# Patient Record
Sex: Male | Born: 1935 | Race: White | Hispanic: No | Marital: Married | Smoking: Never smoker
Health system: Southern US, Community
[De-identification: ages and names within clinical notes are randomized; demographics above are authoritative.]

## PROBLEM LIST (undated history)

## (undated) DIAGNOSIS — C61 Malignant neoplasm of prostate: Secondary | ICD-10-CM

## (undated) DIAGNOSIS — R972 Elevated prostate specific antigen [PSA]: Secondary | ICD-10-CM

## (undated) DIAGNOSIS — Z889 Allergy status to unspecified drugs, medicaments and biological substances status: Secondary | ICD-10-CM

## (undated) DIAGNOSIS — I10 Essential (primary) hypertension: Secondary | ICD-10-CM

## (undated) DIAGNOSIS — F329 Major depressive disorder, single episode, unspecified: Secondary | ICD-10-CM

## (undated) DIAGNOSIS — L570 Actinic keratosis: Secondary | ICD-10-CM

## (undated) DIAGNOSIS — G4700 Insomnia, unspecified: Secondary | ICD-10-CM

## (undated) DIAGNOSIS — M542 Cervicalgia: Secondary | ICD-10-CM

## (undated) DIAGNOSIS — H269 Unspecified cataract: Secondary | ICD-10-CM

## (undated) DIAGNOSIS — M436 Torticollis: Secondary | ICD-10-CM

## (undated) DIAGNOSIS — D649 Anemia, unspecified: Secondary | ICD-10-CM

## (undated) DIAGNOSIS — I739 Peripheral vascular disease, unspecified: Secondary | ICD-10-CM

## (undated) DIAGNOSIS — F32A Depression, unspecified: Secondary | ICD-10-CM

## (undated) DIAGNOSIS — Z8619 Personal history of other infectious and parasitic diseases: Secondary | ICD-10-CM

## (undated) HISTORY — PX: GALLBLADDER SURGERY: SHX652

## (undated) HISTORY — DX: Cervicalgia: M54.2

## (undated) HISTORY — DX: Major depressive disorder, single episode, unspecified: F32.9

## (undated) HISTORY — DX: Actinic keratosis: L57.0

## (undated) HISTORY — DX: Insomnia, unspecified: G47.00

## (undated) HISTORY — DX: Depression, unspecified: F32.A

## (undated) HISTORY — DX: Unspecified cataract: H26.9

## (undated) HISTORY — DX: Elevated prostate specific antigen (PSA): R97.20

## (undated) HISTORY — DX: Torticollis: M43.6

## (undated) HISTORY — DX: Peripheral vascular disease, unspecified: I73.9

## (undated) HISTORY — DX: Malignant neoplasm of prostate: C61

## (undated) HISTORY — DX: Anemia, unspecified: D64.9

## (undated) HISTORY — DX: Personal history of other infectious and parasitic diseases: Z86.19

## (undated) HISTORY — DX: Allergy status to unspecified drugs, medicaments and biological substances: Z88.9

---

## 1993-11-17 HISTORY — PX: BACK SURGERY: SHX140

## 2011-11-18 DIAGNOSIS — C61 Malignant neoplasm of prostate: Secondary | ICD-10-CM

## 2011-11-18 HISTORY — DX: Malignant neoplasm of prostate: C61

## 2011-11-18 HISTORY — PX: PROSTATE SURGERY: SHX751

## 2011-11-19 DIAGNOSIS — Z87898 Personal history of other specified conditions: Secondary | ICD-10-CM | POA: Insufficient documentation

## 2011-11-19 DIAGNOSIS — N401 Enlarged prostate with lower urinary tract symptoms: Secondary | ICD-10-CM | POA: Insufficient documentation

## 2011-11-19 DIAGNOSIS — R972 Elevated prostate specific antigen [PSA]: Secondary | ICD-10-CM | POA: Insufficient documentation

## 2012-08-04 ENCOUNTER — Encounter: Payer: Self-pay | Admitting: *Deleted

## 2012-08-09 ENCOUNTER — Ambulatory Visit (INDEPENDENT_AMBULATORY_CARE_PROVIDER_SITE_OTHER): Payer: Medicare Other | Admitting: Cardiovascular Disease

## 2012-08-09 ENCOUNTER — Encounter: Payer: Self-pay | Admitting: Cardiovascular Disease

## 2012-08-09 VITALS — BP 124/72 | HR 92 | Ht 69.0 in | Wt 178.8 lb

## 2012-08-09 DIAGNOSIS — R9431 Abnormal electrocardiogram [ECG] [EKG]: Secondary | ICD-10-CM

## 2012-08-09 DIAGNOSIS — C61 Malignant neoplasm of prostate: Secondary | ICD-10-CM | POA: Insufficient documentation

## 2012-08-09 NOTE — Assessment & Plan Note (Signed)
Right bundle-branch block seen on the past EKGs. He is asymptomatic, exercising on a regular basis. Clinically, no signs of heart failure. No symptoms of angina. No further testing needed at this time. He would be acceptable risk for any prostate surgery.

## 2012-08-09 NOTE — Progress Notes (Signed)
Patient ID: Stuart Smith, male    DOB: 07-28-1927, 78 y.o.   MRN: [id number]  HPI Comments: Stuart Smith is a very pleasant 78 year old gentleman with a long history of prostate issues, s/p TURP, found to have prostate cancer, who is scheduled to meet with urology tomorrow. If his workup, he was concerned about an abnormal EKG and presents for evaluation.  He currently [facility] at [facility] it is very. He exercises several times per week and uses the treadmill. He reports he typically gets his heart rate up to 115 beats per minute, is able to go 3 miles per hour without any difficulty.  PSA recently increased from 9-13. He had biopsies showing no cancer. He underwent TURP and pathology showed prostate cancer.  EKG done during his procedure showed right bundle-branch block. Blood pressure had decreased following anesthesia.  EKGs available for our review today are from our office showing normal sinus rhythm, right bundle branch block with left axis deviation, heart rate 91 beats per minute  EKG done from Dr. Delia Chimes office shows normal sinus rhythm with right bundle branch block, left axis is not as apparent  EKG done at [facility] 07/06/2012 shows normal sinus rhythm with right bundle branch block, rate 75 beats per minute   Outpatient Encounter Prescriptions as of 08/09/2012  Medication Sig Dispense Refill  . amitriptyline (ELAVIL) 50 MG tablet Take 50 mg by mouth at bedtime.      Marland Kitchen aspirin 81 MG tablet Take 81 mg by mouth daily.      . Cetirizine HCl (ZYRTEC ALLERGY) 10 MG CAPS Take 10 mg by mouth daily as needed.      . clonazePAM (KLONOPIN) 0.5 MG tablet Take 0.5 mg by mouth 3 (three) times daily.      Marland Kitchen lisinopril (PRINIVIL,ZESTRIL) 10 MG tablet Take 10 mg by mouth daily.      . Tamsulosin HCl (FLOMAX) 0.4 MG CAPS Take 0.4 mg by mouth daily.         Review of Systems  Constitutional: Negative.   HENT: Negative.   Eyes: Negative.   Respiratory: Negative.   Cardiovascular:  Negative.   Gastrointestinal: Negative.   Musculoskeletal: Negative.   Skin: Negative.   Neurological: Negative.   Hematological: Negative.   Psychiatric/Behavioral: Negative.   All other systems reviewed and are negative.    BP 124/72  Pulse 92  Ht 5\' 9"  (1.753 m)  Wt 178 lb 12 oz (81.08 kg)  BMI 26.40 kg/m2  Physical Exam  Nursing note and vitals reviewed. Constitutional: He is oriented to person, place, and time. He appears well-developed and well-nourished.  HENT:  Head: Normocephalic.  Nose: Nose normal.  Mouth/Throat: Oropharynx is clear and moist.  Eyes: Conjunctivae normal are normal. Pupils are equal, round, and reactive to light.  Neck: Normal range of motion. Neck supple. No JVD present.  Cardiovascular: Normal rate, regular rhythm, S1 normal, S2 normal, normal heart sounds and intact distal pulses.  Exam reveals no gallop and no friction rub.   No murmur heard. Pulmonary/Chest: Effort normal and breath sounds normal. No respiratory distress. He has no wheezes. He has no rales. He exhibits no tenderness.  Abdominal: Soft. Bowel sounds are normal. He exhibits no distension. There is no tenderness.  Musculoskeletal: Normal range of motion. He exhibits no edema and no tenderness.  Lymphadenopathy:    He has no cervical adenopathy.  Neurological: He is alert and oriented to person, place, and time. Coordination normal.  Skin: Skin is  warm and dry. No rash noted. No erythema.  Psychiatric: He has a normal mood and affect. His behavior is normal. Judgment and thought content normal.           Assessment and Plan

## 2012-08-09 NOTE — Patient Instructions (Addendum)
You are doing well. No medication changes were made.  Please call us if you have new issues that need to be addressed before your next appt.  Your physician wants you to follow-up in: 12 months.  You will receive a reminder letter in the mail two months in advance. If you don't receive a letter, please call our office to schedule the follow-up appointment. 

## 2012-08-09 NOTE — Assessment & Plan Note (Signed)
He has a meeting with urology tomorrow to discuss further treatment options. If surgery is needed, he would be an acceptable risk.

## 2012-08-10 DIAGNOSIS — C61 Malignant neoplasm of prostate: Secondary | ICD-10-CM | POA: Insufficient documentation

## 2012-08-11 ENCOUNTER — Encounter: Payer: Self-pay | Admitting: Cardiovascular Disease

## 2013-05-19 ENCOUNTER — Ambulatory Visit (INDEPENDENT_AMBULATORY_CARE_PROVIDER_SITE_OTHER): Payer: Medicare Other | Admitting: Cardiovascular Disease

## 2013-05-19 ENCOUNTER — Encounter: Payer: Self-pay | Admitting: Cardiovascular Disease

## 2013-05-19 VITALS — BP 132/72 | HR 74 | Ht 69.0 in | Wt 186.0 lb

## 2013-05-19 DIAGNOSIS — I700 Atherosclerosis of aorta: Secondary | ICD-10-CM | POA: Insufficient documentation

## 2013-05-19 DIAGNOSIS — R9431 Abnormal electrocardiogram [ECG] [EKG]: Secondary | ICD-10-CM

## 2013-05-19 NOTE — Assessment & Plan Note (Addendum)
Outside CT scan Report indicating aortic atherosclerosis. Full report not available . We'll try to have him obtain the images for our review. Uncertain if this is trivial, mild to moderate or severe. Cholesterol is low on no medications. He is not a smoker, no diabetes. No significant family history.  Discussed this with him and he's not eager to start a medication. He does report previous screening of his carotid and aVR is in the past. He reports this was normal.

## 2013-05-19 NOTE — Assessment & Plan Note (Signed)
Baseline left bundle branch block. No further workup at this time

## 2013-05-19 NOTE — Progress Notes (Signed)
Patient ID: Stuart Smith, male    DOB: [birth year], 79 y.o.   MRN: [id number]  HPI Comments: Stuart Smith is a very pleasant 79 year old gentleman with a long history of prostate issues, s/p TURP, found to have prostate cancer, status post prostate resection, who presents for routine followup.  He is concerned after a bone scan showed abdominal aortic atherosclerosis. This was also previously seen on x-ray earlier.   He currently lives [facility] [place] it is very. He exercises several times per week and uses the treadmill.  Denies any significant chest pain with exertion. He does report that his PSA has been slowly climbing and he was recently started on hormone shots.  EKG shows normal sinus rhythm with rate 74 beats a minute, right bundle branch block  Bone scan done 05/05/2013, total abdominal aortic atherosclerosis but does not indicate the extent  Recent lab work shows total cholesterol 147, LDL 88, HDL 46   Outpatient Encounter Prescriptions as of 05/19/2013  Medication Sig Dispense Refill  . amitriptyline (ELAVIL) 50 MG tablet Take 50 mg by mouth at bedtime.      Marland Kitchen aspirin 81 MG tablet Take 81 mg by mouth daily.      . clonazePAM (KLONOPIN) 0.5 MG tablet Take 0.5 mg by mouth 3 (three) times daily.      Marland Kitchen lisinopril (PRINIVIL,ZESTRIL) 10 MG tablet Take 10 mg by mouth daily.      . NON FORMULARY horomone injection LH RH      . [DISCONTINUED] Cetirizine HCl (ZYRTEC ALLERGY) 10 MG CAPS Take 10 mg by mouth daily as needed.      . [DISCONTINUED] Tamsulosin HCl (FLOMAX) 0.4 MG CAPS Take 0.4 mg by mouth daily.       No facility-administered encounter medications on file as of 05/19/2013.     Review of Systems  Constitutional: Negative.   HENT: Negative.   Eyes: Negative.   Respiratory: Negative.   Cardiovascular: Negative.   Gastrointestinal: Negative.   Musculoskeletal: Negative.   Skin: Negative.   Neurological: Negative.   Psychiatric/Behavioral: Negative.   All other systems  reviewed and are negative.    BP 132/72  Pulse 74  Ht 5\' 9"  (1.753 m)  Wt 186 lb (84.369 kg)  BMI 27.45 kg/m2  Physical Exam  Nursing note and vitals reviewed. Constitutional: He is oriented to person, place, and time. He appears well-developed and well-nourished.  HENT:  Head: Normocephalic.  Nose: Nose normal.  Mouth/Throat: Oropharynx is clear and moist.  Eyes: Conjunctivae are normal. Pupils are equal, round, and reactive to light.  Neck: Normal range of motion. Neck supple. No JVD present.  Cardiovascular: Normal rate, regular rhythm, S1 normal, S2 normal, normal heart sounds and intact distal pulses.  Exam reveals no gallop and no friction rub.   No murmur heard. Pulmonary/Chest: Effort normal and breath sounds normal. No respiratory distress. He has no wheezes. He has no rales. He exhibits no tenderness.  Abdominal: Soft. Bowel sounds are normal. He exhibits no distension. There is no tenderness.  Musculoskeletal: Normal range of motion. He exhibits no edema and no tenderness.  Lymphadenopathy:    He has no cervical adenopathy.  Neurological: He is alert and oriented to person, place, and time. Coordination normal.  Skin: Skin is warm and dry. No rash noted. No erythema.  Psychiatric: He has a normal mood and affect. His behavior is normal. Judgment and thought content normal.      Assessment and Plan

## 2013-05-19 NOTE — Patient Instructions (Addendum)
You are doing well. No medication changes were made.  Please call us if you have new issues that need to be addressed before your next appt.  Your physician wants you to follow-up in: 12 months.  You will receive a reminder letter in the mail two months in advance. If you don't receive a letter, please call our office to schedule the follow-up appointment. 

## 2013-05-30 ENCOUNTER — Encounter: Payer: Self-pay | Admitting: *Deleted

## 2013-06-03 ENCOUNTER — Telehealth: Payer: Self-pay | Admitting: *Deleted

## 2013-06-03 NOTE — Telephone Encounter (Signed)
Dr. Mariah Milling had asked patient to get a copy of the lumbar and whole body xray from [facility].  The patient dropped off this cd for dr. Mariah Milling to review and it was placed in the nursing blue tray.  Please call patient once it has been reviewed.

## 2013-06-06 NOTE — Telephone Encounter (Signed)
Unfortunately the images that the patient provided do not let me look at the heart  The x-ray which does not allow [place] to see coronary artery disease The other set of images is a nuclear scan looking at the bones  I was under the impression that it was a CT scan that he had had  Started to make him have jumped through loops to get the images Unfortunately they do not let us look at the heart I think he would be worth him holding onto the CD And I will leave it at the front desk for him for pickup

## 2013-06-06 NOTE — Telephone Encounter (Signed)
Please review CD of xrays from [facility] and advised.  Thanks

## 2013-06-06 NOTE — Telephone Encounter (Signed)
Called spoke with pt advised per Dr Mariah Milling unable to visualize the heart in the films that he dropped off at the office. Advised pt Dr Mariah Milling recommends he hold onto these films just in case he may need in the future.  Will leave at front desk for pt to pick up.  Pt aware

## 2013-06-22 ENCOUNTER — Other Ambulatory Visit: Payer: Self-pay

## 2013-09-09 DIAGNOSIS — G243 Spasmodic torticollis: Secondary | ICD-10-CM | POA: Insufficient documentation

## 2013-09-09 DIAGNOSIS — G245 Blepharospasm: Secondary | ICD-10-CM | POA: Insufficient documentation

## 2013-09-22 ENCOUNTER — Other Ambulatory Visit: Payer: Self-pay

## 2014-07-25 ENCOUNTER — Encounter: Payer: Self-pay | Admitting: Cardiovascular Disease

## 2014-07-25 ENCOUNTER — Ambulatory Visit (INDEPENDENT_AMBULATORY_CARE_PROVIDER_SITE_OTHER): Payer: Medicare Other | Admitting: Cardiovascular Disease

## 2014-07-25 VITALS — BP 142/82 | HR 89 | Ht 69.5 in | Wt 181.0 lb

## 2014-07-25 DIAGNOSIS — I700 Atherosclerosis of aorta: Secondary | ICD-10-CM

## 2014-07-25 DIAGNOSIS — R9431 Abnormal electrocardiogram [ECG] [EKG]: Secondary | ICD-10-CM

## 2014-07-25 DIAGNOSIS — C61 Malignant neoplasm of prostate: Secondary | ICD-10-CM

## 2014-07-25 DIAGNOSIS — I10 Essential (primary) hypertension: Secondary | ICD-10-CM | POA: Insufficient documentation

## 2014-07-25 NOTE — Patient Instructions (Signed)
You are doing well. No medication changes were made.  Please call us if you have new issues that need to be addressed before your next appt.  Your physician wants you to follow-up in: 12 months.  You will receive a reminder letter in the mail two months in advance. If you don't receive a letter, please call our office to schedule the follow-up appointment. 

## 2014-07-25 NOTE — Assessment & Plan Note (Addendum)
Cholesterol is at excellent range based on his prior numbers. he's not particularly interested in a cholesterol medication.

## 2014-07-25 NOTE — Assessment & Plan Note (Signed)
He reports difficult time tolerating the prostate cancer treatment. Was unable to complete the regiment

## 2014-07-25 NOTE — Progress Notes (Signed)
   Patient ID: Stuart Smith, male    DOB: Sep 03, 1927, 80 y.o.   MRN: [id number]  HPI Comments: Stuart Smith is a very pleasant 80 year old gentleman with a long history of prostate issues, s/p TURP, found to have prostate cancer, status post prostate resection, who presents for routine followup.  In followup today, Stuart Smith reports having a difficult year with the treatment for prostate cancer. Stuart Smith was unable to complete the hormone therapy, last round of treatment was January 2015. Stuart Smith was unable to receive the radioactive seeds as Stuart Smith had a previous TURP  Previous bone scan showed abdominal aortic atherosclerosis.  also previously seen on x-ray earlier.   Stuart Smith currently lives at [facility] [place]. Trying to regain his strength after prostate cancer treatment Denies any significant chest pain with exertion.  EKG shows normal sinus rhythm with rate 89 beats a minute, right bundle branch block  Bone scan done 05/05/2013, total abdominal aortic atherosclerosis but does not indicate the extent  Previous lab work shows total cholesterol 147, LDL 88, HDL 46   Outpatient Encounter Prescriptions as of 07/25/2014  Medication Sig  . aspirin 81 MG tablet Take 81 mg by mouth daily.  . cetirizine (ZYRTEC) 10 MG tablet Take 10 mg by mouth daily.  . clonazePAM (KLONOPIN) 0.5 MG tablet Take 0.5 mg by mouth 3 (three) times daily.  Marland Kitchen lisinopril (PRINIVIL,ZESTRIL) 10 MG tablet Take 10 mg by mouth daily.  . Multiple Vitamin (MULTIVITAMIN) capsule Take 1 capsule by mouth daily.  . traZODone (DESYREL) 50 MG tablet Take 50 mg by mouth at bedtime.    Review of Systems  Constitutional: Negative.   HENT: Negative.   Eyes: Negative.   Respiratory: Negative.   Cardiovascular: Negative.   Gastrointestinal: Negative.   Endocrine: Negative.   Musculoskeletal: Negative.   Skin: Negative.   Allergic/Immunologic: Negative.   Neurological: Negative.   Hematological: Negative.   Psychiatric/Behavioral: Negative.   All other  systems reviewed and are negative.   BP 142/82  Pulse 89  Ht 5' 9.5" (1.765 m)  Wt 181 lb (82.101 kg)  BMI 26.35 kg/m2  Physical Exam  Nursing note and vitals reviewed. Constitutional: Stuart Smith is oriented to person, place, and time. Stuart Smith appears well-developed and well-nourished.  HENT:  Head: Normocephalic.  Nose: Nose normal.  Mouth/Throat: Oropharynx is clear and moist.  Eyes: Conjunctivae are normal. Pupils are equal, round, and reactive to light.  Neck: Normal range of motion. Neck supple. No JVD present.  Cardiovascular: Normal rate, regular rhythm, S1 normal, S2 normal, normal heart sounds and intact distal pulses.  Exam reveals no gallop and no friction rub.   No murmur heard. Pulmonary/Chest: Effort normal and breath sounds normal. No respiratory distress. Stuart Smith has no wheezes. Stuart Smith has no rales. Stuart Smith exhibits no tenderness.  Abdominal: Soft. Bowel sounds are normal. Stuart Smith exhibits no distension. There is no tenderness.  Musculoskeletal: Normal range of motion. Stuart Smith exhibits no edema and no tenderness.  Lymphadenopathy:    Stuart Smith has no cervical adenopathy.  Neurological: Stuart Smith is alert and oriented to person, place, and time. Coordination normal.  Skin: Skin is warm and dry. No rash noted. No erythema.  Psychiatric: Stuart Smith has a normal mood and affect. His behavior is normal. Judgment and thought content normal.      Assessment and Plan

## 2014-07-25 NOTE — Assessment & Plan Note (Signed)
No change on EKG. Continues to have right bundle branch block

## 2014-07-25 NOTE — Assessment & Plan Note (Signed)
Blood pressure is well controlled on today's visit. No changes made to the medications. 

## 2015-10-25 ENCOUNTER — Ambulatory Visit (INDEPENDENT_AMBULATORY_CARE_PROVIDER_SITE_OTHER): Payer: Medicare Other | Admitting: Cardiovascular Disease

## 2015-10-25 ENCOUNTER — Encounter: Payer: Self-pay | Admitting: Cardiovascular Disease

## 2015-10-25 VITALS — BP 134/64 | HR 83 | Ht 70.0 in | Wt 196.2 lb

## 2015-10-25 DIAGNOSIS — I700 Atherosclerosis of aorta: Secondary | ICD-10-CM

## 2015-10-25 DIAGNOSIS — R9431 Abnormal electrocardiogram [ECG] [EKG]: Secondary | ICD-10-CM

## 2015-10-25 DIAGNOSIS — I10 Essential (primary) hypertension: Secondary | ICD-10-CM

## 2015-10-25 DIAGNOSIS — C61 Malignant neoplasm of prostate: Secondary | ICD-10-CM

## 2015-10-25 NOTE — Assessment & Plan Note (Signed)
Cholesterol is higher on today's visit after recent weight gain over the past year He prefers to consider a CT coronary calcium score before starting a cholesterol medication Information was provided to him, he will research this and call us if he would like to schedule Currently asymptomatic

## 2015-10-25 NOTE — Assessment & Plan Note (Signed)
Currently taking a break from his prostate cancer medication.

## 2015-10-25 NOTE — Progress Notes (Signed)
Patient ID: Stuart Smith, male    DOB: 19-Sep-1927, 81 y.o.   MRN: [id number]  HPI Comments: Stuart Smith is a very pleasant 81 year old gentleman with a long history of prostate issues, s/p TURP, found to have prostate cancer, status post prostate resection, who presents for routine followup of his abdominal aortic atherosclerosis Currently lives [facility] [place]  In follow-up, he reports that he is doing well. He had to change his prostate cancer medication, reports this caused weight gain, some malaise, fatigue. This was stopped and he is taking a 3 month break. Possibly scheduled to go back on the medication in 2017 Reports PSA has remained low  Denies any shortness of breath or chest pain on exertion Review of lab work shows total cholesterol 189, LDL 116 Numbers are higher than previous lab draw were total cholesterol was 140-150  EKG on today's visit shows normal sinus rhythm with rate 83 bpm, right bundle branch block  Other past medical history  unable to complete the hormone therapy, last round of treatment was January 2015. He was unable to receive the radioactive seeds as he had a previous TURP  Previous bone scan showed abdominal aortic atherosclerosis.  also previously seen on x-ray earlier.   Bone scan done 05/05/2013, total abdominal aortic atherosclerosis but does not indicate the extent  Previous lab work shows total cholesterol 147, LDL 88, HDL 46   Allergies  Allergen Reactions  . Keflex [Cephalexin]     Current Outpatient Prescriptions on File Prior to Visit  Medication Sig Dispense Refill  . aspirin 81 MG tablet Take 81 mg by mouth daily.    . cetirizine (ZYRTEC) 10 MG tablet Take 10 mg by mouth daily.    . clonazePAM (KLONOPIN) 0.5 MG tablet Take 0.5 mg by mouth 3 (three) times daily.    Marland Kitchen lisinopril (PRINIVIL,ZESTRIL) 10 MG tablet Take 10 mg by mouth daily.    . Multiple Vitamin (MULTIVITAMIN) capsule Take 1 capsule by mouth daily.    . traZODone (DESYREL)  50 MG tablet Take 50 mg by mouth at bedtime.     No current facility-administered medications on file prior to visit.    Past Medical History  Diagnosis Date  . H/O seasonal allergies   . Depression   . Cataracts, bilateral   . History of chickenpox   . Elevated PSA   . Torticollis   . Prostate cancer [facility]) 2013    Surgery     Past Surgical History  Procedure Laterality Date  . Gallbladder surgery    . Back surgery  1995  . Prostate surgery  2013    Social History  reports that he has never smoked. He does not have any smokeless tobacco history on file. He reports that he drinks about 0.5 oz of alcohol per week. He reports that he does not use illicit drugs.  Family History Family history is unknown by patient.  Review of Systems  Constitutional: Negative.   Respiratory: Negative.   Cardiovascular: Negative.   Gastrointestinal: Negative.   Musculoskeletal: Negative.   Neurological: Negative.   Hematological: Negative.   Psychiatric/Behavioral: Negative.   All other systems reviewed and are negative.   BP 134/64 mmHg  Pulse 83  Ht 5\' 10"  (1.778 m)  Wt 196 lb 4 oz (89.018 kg)  BMI 28.16 kg/m2  Physical Exam  Constitutional: He is oriented to person, place, and time. He appears well-developed and well-nourished.  HENT:  Head: Normocephalic.  Nose: Nose normal.  Mouth/Throat:  Oropharynx is clear and moist.  Eyes: Conjunctivae are normal. Pupils are equal, round, and reactive to light.  Neck: Normal range of motion. Neck supple. No JVD present.  Cardiovascular: Normal rate, regular rhythm, S1 normal, S2 normal, normal heart sounds and intact distal pulses.  Exam reveals no gallop and no friction rub.   No murmur heard. Pulmonary/Chest: Effort normal and breath sounds normal. No respiratory distress. He has no wheezes. He has no rales. He exhibits no tenderness.  Abdominal: Soft. Bowel sounds are normal. He exhibits no distension. There is no tenderness.   Musculoskeletal: Normal range of motion. He exhibits no edema or tenderness.  Lymphadenopathy:    He has no cervical adenopathy.  Neurological: He is alert and oriented to person, place, and time. Coordination normal.  Skin: Skin is warm and dry. No rash noted. No erythema.  Psychiatric: He has a normal mood and affect. His behavior is normal. Judgment and thought content normal.      Assessment and Plan   Nursing note and vitals reviewed.

## 2015-10-25 NOTE — Assessment & Plan Note (Signed)
Blood pressure is well controlled on today's visit. No changes made to the medications. 

## 2015-10-25 NOTE — Patient Instructions (Signed)
You are doing well. No medication changes were made.  Please research "CT coronary calcium score"  Please call us if you have new issues that need to be addressed before your next appt.  Your physician wants you to follow-up in: 12 months.  You will receive a reminder letter in the mail two months in advance. If you don't receive a letter, please call our office to schedule the follow-up appointment.

## 2016-07-28 DIAGNOSIS — Z9229 Personal history of other drug therapy: Secondary | ICD-10-CM | POA: Insufficient documentation

## 2016-10-27 ENCOUNTER — Ambulatory Visit (INDEPENDENT_AMBULATORY_CARE_PROVIDER_SITE_OTHER): Payer: Medicare Other | Admitting: Cardiovascular Disease

## 2016-10-27 ENCOUNTER — Encounter: Payer: Self-pay | Admitting: Cardiovascular Disease

## 2016-10-27 VITALS — BP 144/70 | HR 74 | Ht 69.0 in | Wt 195.8 lb

## 2016-10-27 DIAGNOSIS — I10 Essential (primary) hypertension: Secondary | ICD-10-CM | POA: Diagnosis not present

## 2016-10-27 DIAGNOSIS — I700 Atherosclerosis of aorta: Secondary | ICD-10-CM

## 2016-10-27 DIAGNOSIS — R2681 Unsteadiness on feet: Secondary | ICD-10-CM

## 2016-10-27 DIAGNOSIS — R9431 Abnormal electrocardiogram [ECG] [EKG]: Secondary | ICD-10-CM | POA: Diagnosis not present

## 2016-10-27 NOTE — Progress Notes (Addendum)
Cardiology Office Note  Date:  10/27/2016   ID:  Stuart Smith, DOB 1927/06/09, MRN [id number]  PCP:  Maryland Pink, MD   Chief Complaint  Patient presents with  . other    9mo f/u. Pt states he is doing well. Reviewed meds with pt verbally.    HPI:  Stuart Smith is a very pleasant 82 year old gentleman with a long history of prostate issues, s/p TURP, found to have prostate cancer, status post prostate resection, who presents for routine followup of his abdominal aortic atherosclerosis Currently lives at [facility] [place]   In follow-up today he reports that he is doing well No regular exercise, legs are weaker, difficulty getting around Reports he is back on Lupron for elevated PSA up to 14 This has caused constipation For leg weakness, he did PT for 6 weeks, now not doing anything Denies any shortness of breath or chest pain on exertion  Labs: CBC ,  Total chol 166, LDL 93  Declined a statin on his last clinic visit Cholesterol was higher up to 190 from 140s Offered CT coronary calcium score, he did not follow-up on scheduling this  EKG on today's visit shows normal sinus rhythm with rate 74 bpm, right bundle branch block  Other past medical history  unable to complete the hormone therapy, last round of treatment was January 2015. He was unable to receive the radioactive seeds as he had a previous TURP  Previous bone scan showed abdominal aortic atherosclerosis.  also previously seen on x-ray earlier.   Bone scan done 05/05/2013, total abdominal aortic atherosclerosis but does not indicate the extent  Previous lab work shows total cholesterol 147, LDL 88, HDL 46   PMH:   has a past medical history of Cataracts, bilateral; Depression; Elevated PSA; H/O seasonal allergies; History of chickenpox; Prostate cancer ([place]) (2013); and Torticollis.  PSH:    Past Surgical History:  Procedure Laterality Date  . BACK SURGERY  1995  . GALLBLADDER SURGERY    . PROSTATE SURGERY   2013    Current Outpatient Prescriptions  Medication Sig Dispense Refill  . aspirin 81 MG tablet Take 81 mg by mouth daily.    . cetirizine (ZYRTEC) 10 MG tablet Take 10 mg by mouth daily.    . clonazePAM (KLONOPIN) 0.5 MG tablet Take 0.5 mg by mouth 3 (three) times daily.    Marland Kitchen Leuprolide Acetate, 6 Month, (LUPRON DEPOT, 27-MONTH,) 45 MG injection Inject 45 mg into the muscle every 6 (six) months.    Marland Kitchen lisinopril (PRINIVIL,ZESTRIL) 10 MG tablet Take 10 mg by mouth daily.    . Multiple Vitamin (MULTIVITAMIN) capsule Take 1 capsule by mouth daily.    . sertraline (ZOLOFT) 25 MG tablet Take 25 mg by mouth at bedtime.    . traZODone (DESYREL) 50 MG tablet Take 50 mg by mouth at bedtime.     No current facility-administered medications for this visit.      Allergies:   Keflex [cephalexin]   Social History:  The patient  reports that he has never smoked. He has never used smokeless tobacco. He reports that he drinks about 0.5 oz of alcohol per week . He reports that he does not use drugs.   Family History:   Family history is unknown by patient.    Review of Systems: Review of Systems  Constitutional: Negative.   Respiratory: Negative.   Cardiovascular: Negative.   Gastrointestinal: Negative.   Musculoskeletal: Negative.        Unsteady gait  Neurological: Negative.   Psychiatric/Behavioral: Negative.   All other systems reviewed and are negative.    PHYSICAL EXAM: VS:  BP (!) 144/70 (BP Location: Left Arm, Patient Position: Sitting, Cuff Size: Normal)   Pulse 74   Ht 5\' 9"  (1.753 m)   Wt 195 lb 12 oz (88.8 kg)   BMI 28.91 kg/m  , BMI Body mass index is 28.91 kg/m. GEN: Well nourished, well developed, in no acute distress, difficulty transferring from chair to table  HEENT: normal  Neck: no JVD, carotid bruits, or masses Cardiac: RRR; no murmurs, rubs, or gallops,no edema  Respiratory:  clear to auscultation bilaterally, normal work of breathing GI: soft, nontender,  nondistended, + BS MS: no deformity or atrophy  Skin: warm and dry, no rash Neuro:  Strength and sensation are intact Psych: euthymic mood, full affect    Recent Labs: No results found for requested labs within last 8760 hours.    Lipid Panel No results found for: CHOL, HDL, LDLCALC, TRIG    Wt Readings from Last 3 Encounters:  10/27/16 195 lb 12 oz (88.8 kg)  10/25/15 196 lb 4 oz (89 kg)  07/25/14 181 lb (82.1 kg)       ASSESSMENT AND PLAN:  Atherosclerosis of abdominal aorta (HCC) Cholesterol down to 160 Previously indicated he did not want cholesterol medication  Essential hypertension - Plan: EKG 12-Lead Blood pressure is well controlled on today's visit. No changes made to the medications.  Abnormal EKG Right bundle branch block, stable No further workup needed  Gait instability Long discussion concerning his gait instability, high risk of falls Recommended he consider doing PT or regular exercise at [facility]   Total encounter time more than 25 minutes  Greater than 50% was spent in counseling and coordination of care with the patient   Disposition:   F/U  12 months   Orders Placed This Encounter  Procedures  . EKG 12-Lead     Signed, Esmond Plants, M.D., Ph.D. 10/27/2016  [place], [place]

## 2016-10-27 NOTE — Patient Instructions (Signed)

## 2018-01-04 ENCOUNTER — Ambulatory Visit: Payer: Medicare Other | Admitting: Cardiovascular Disease

## 2018-01-06 NOTE — Progress Notes (Signed)
Cardiology Office Note  Date:  01/08/2018   ID:  [name], DOB 11-08-1927, MRN [id number]  PCP:  Maryland Pink, MD   Chief Complaint  Patient presents with  . Other    12 month follow up. [name] denies chest pain and SOB. Meds reviewed verbally with patient.     HPI:  [name] is a very pleasant 84 year old gentleman with a  long history of prostate issues, s/p TURP,  found to have prostate cancer, status post prostate resection,  Currently lives [facility] [place]  who presents for routine followup of his abdominal aortic atherosclerosis  Wife presents with him today Severe reaction left arm, After PNA short  Had near syncope Has picture of left arm, swollen, erythematous shoulder to wrist Started to imprve in one week, Month to get better,   Down 10 pounds in a few months Exercising more, doing PT Reports he is participating in a maintenance program  Otherwise feeling well, no complaints  Previously on  Lupron for elevated PSA   No recent lab work available Previous total chol 166, LDL 93 in 2017 Declined a statin on his last clinic visit Offered CT coronary calcium score, he did not follow-up on scheduling this  EKG on today's visit shows normal sinus rhythm with rate 77 bpm, right bundle branch block  Other past medical history  unable to complete the hormone therapy, last round of treatment was January 2015. He was unable to receive the radioactive seeds as he had a previous TURP  Previous bone scan showed abdominal aortic atherosclerosis.  also previously seen on x-ray earlier.   Bone scan done 05/05/2013, total abdominal aortic atherosclerosis but does not indicate the extent  Previous lab work shows total cholesterol 147, LDL 88, HDL 46   PMH:   has a past medical history of Cataracts, bilateral, Depression, Elevated PSA, H/O seasonal allergies, History of chickenpox, Prostate cancer ([place]) (2013), and Torticollis.  PSH:    Past Surgical  History:  Procedure Laterality Date  . BACK SURGERY  1995  . GALLBLADDER SURGERY    . PROSTATE SURGERY  2013    Current Outpatient Medications  Medication Sig Dispense Refill  . aspirin 81 MG tablet Take 81 mg by mouth daily.    . cetirizine (ZYRTEC) 10 MG tablet Take 10 mg by mouth daily.    . clonazePAM (KLONOPIN) 0.5 MG tablet Take 0.5 mg by mouth 3 (three) times daily.    . Flaxseed Oil OIL Take by mouth.    . Leuprolide Acetate, 6 Month, (LUPRON DEPOT, 86-MONTH,) 45 MG injection Inject 45 mg into the muscle every 6 (six) months.    Marland Kitchen lisinopril (PRINIVIL,ZESTRIL) 10 MG tablet Take 10 mg by mouth daily.    . magnesium hydroxide (MILK OF MAGNESIA) 400 MG/5ML suspension Take by mouth.    . Multiple Vitamin (MULTIVITAMIN) capsule Take 1 capsule by mouth daily.    . sertraline (ZOLOFT) 25 MG tablet Take 25 mg by mouth at bedtime.    . traZODone (DESYREL) 50 MG tablet Take 50 mg by mouth at bedtime.    . vitamin C (ASCORBIC ACID) 500 MG tablet Take by mouth.     No current facility-administered medications for this visit.      Allergies:   Keflex [cephalexin]   Social History:  The patient  reports that  has never smoked. he has never used smokeless tobacco. He reports that he drinks about 0.5 oz of alcohol per week. He reports that he  does not use drugs.   Family History:   Family history is unknown by patient.    Review of Systems: Review of Systems  Constitutional: Negative.   Respiratory: Negative.   Cardiovascular: Negative.   Gastrointestinal: Negative.   Musculoskeletal: Negative.        Unsteady gait  Neurological: Negative.   Psychiatric/Behavioral: Negative.   All other systems reviewed and are negative.    PHYSICAL EXAM: VS:  BP 136/70 (BP Location: Left Arm, Patient Position: Sitting, Cuff Size: Normal)   Pulse 77   Ht 5\' 9"  (1.753 m)   Wt 186 lb (84.4 kg)   BMI 27.47 kg/m  , BMI Body mass index is 27.47 kg/m. Constitutional:  oriented to person, place,  and time. No distress.  HENT:  Head: Normocephalic and atraumatic.  Eyes:  no discharge. No scleral icterus.  Neck: Normal range of motion. Neck supple. No JVD present.  Cardiovascular: Normal rate, regular rhythm, normal heart sounds and intact distal pulses. Exam reveals no gallop and no friction rub. No edema No murmur heard. Pulmonary/Chest: Effort normal and breath sounds normal. No stridor. No respiratory distress.  no wheezes.  no rales.  no tenderness.  Abdominal: Soft.  no distension.  no tenderness.  Musculoskeletal: Normal range of motion.  no  tenderness or deformity.  Neurological:  normal muscle tone. Coordination normal. No atrophy Skin: Skin is warm and dry. No rash noted. not diaphoretic.  Psychiatric:  normal mood and affect. behavior is normal. Thought content normal.   Recent Labs: No results found for requested labs within last 8760 hours.    Lipid Panel No results found for: CHOL, HDL, LDLCALC, TRIG    Wt Readings from Last 3 Encounters:  01/08/18 186 lb (84.4 kg)  10/27/16 195 lb 12 oz (88.8 kg)  10/25/15 196 lb 4 oz (89 kg)       ASSESSMENT AND PLAN:  Atherosclerosis of abdominal aorta (HCC) Previously declined cholesterol medication Recent 10 pound weight loss which will likely help with cholesterol numbers Non-smoker, nondiabetic  Essential hypertension - Plan: EKG 12-Lead Blood pressure is well controlled on today's visit. No changes made to the medications. Stable  Abnormal EKG Right bundle branch block, stable No further workup needed  Gait instability Finished working with physical therapy Now doing maintenance exercise  Allergic reaction To recent pneumonia shot Severe left arm swelling, erythema improved after 1 week, took 1 month to Totally resolve.  Primary care aware   Total encounter time more than 25 minutes  Greater than 50% was spent in counseling and coordination of care with the patient   Disposition:   F/U  12 months  as needed   Orders Placed This Encounter  Procedures  . EKG 12-Lead     Signed, Stuart Smith, M.D., Ph.D. 01/08/2018  [place], [place]

## 2018-01-08 ENCOUNTER — Ambulatory Visit (INDEPENDENT_AMBULATORY_CARE_PROVIDER_SITE_OTHER): Payer: Medicare Other | Admitting: Cardiovascular Disease

## 2018-01-08 ENCOUNTER — Encounter: Payer: Self-pay | Admitting: Cardiovascular Disease

## 2018-01-08 VITALS — BP 136/70 | HR 77 | Ht 69.0 in | Wt 186.0 lb

## 2018-01-08 DIAGNOSIS — E782 Mixed hyperlipidemia: Secondary | ICD-10-CM

## 2018-01-08 DIAGNOSIS — I10 Essential (primary) hypertension: Secondary | ICD-10-CM | POA: Diagnosis not present

## 2018-01-08 DIAGNOSIS — I700 Atherosclerosis of aorta: Secondary | ICD-10-CM | POA: Diagnosis not present

## 2018-01-08 NOTE — Patient Instructions (Signed)

## 2018-08-22 ENCOUNTER — Emergency Department: Payer: Medicare Other

## 2018-08-22 ENCOUNTER — Observation Stay: Payer: Medicare Other

## 2018-08-22 ENCOUNTER — Other Ambulatory Visit: Payer: Self-pay

## 2018-08-22 ENCOUNTER — Observation Stay
Admission: EM | Admit: 2018-08-22 | Discharge: 2018-08-23 | Disposition: A | Discharge status: Home / Self Care | Payer: Medicare Other | Attending: Specialist | Admitting: Specialist

## 2018-08-22 ENCOUNTER — Encounter: Payer: Self-pay | Admitting: Emergency Medicine

## 2018-08-22 DIAGNOSIS — Z79899 Other long term (current) drug therapy: Secondary | ICD-10-CM | POA: Insufficient documentation

## 2018-08-22 DIAGNOSIS — F418 Other specified anxiety disorders: Secondary | ICD-10-CM | POA: Insufficient documentation

## 2018-08-22 DIAGNOSIS — H9193 Unspecified hearing loss, bilateral: Secondary | ICD-10-CM | POA: Diagnosis not present

## 2018-08-22 DIAGNOSIS — I10 Essential (primary) hypertension: Secondary | ICD-10-CM | POA: Insufficient documentation

## 2018-08-22 DIAGNOSIS — I6523 Occlusion and stenosis of bilateral carotid arteries: Secondary | ICD-10-CM | POA: Diagnosis not present

## 2018-08-22 DIAGNOSIS — Z881 Allergy status to other antibiotic agents status: Secondary | ICD-10-CM | POA: Diagnosis not present

## 2018-08-22 DIAGNOSIS — R42 Dizziness and giddiness: Secondary | ICD-10-CM | POA: Diagnosis present

## 2018-08-22 DIAGNOSIS — Z8546 Personal history of malignant neoplasm of prostate: Secondary | ICD-10-CM | POA: Insufficient documentation

## 2018-08-22 DIAGNOSIS — R270 Ataxia, unspecified: Secondary | ICD-10-CM

## 2018-08-22 DIAGNOSIS — H933X9 Disorders of unspecified acoustic nerve: Secondary | ICD-10-CM | POA: Diagnosis not present

## 2018-08-22 DIAGNOSIS — I700 Atherosclerosis of aorta: Secondary | ICD-10-CM | POA: Diagnosis not present

## 2018-08-22 DIAGNOSIS — Z7982 Long term (current) use of aspirin: Secondary | ICD-10-CM | POA: Insufficient documentation

## 2018-08-22 DIAGNOSIS — Z974 Presence of external hearing-aid: Secondary | ICD-10-CM | POA: Diagnosis not present

## 2018-08-22 HISTORY — DX: Essential (primary) hypertension: I10

## 2018-08-22 LAB — URINALYSIS, ROUTINE W REFLEX MICROSCOPIC
Bilirubin Urine: NEGATIVE
GLUCOSE, UA: NEGATIVE mg/dL
Hgb urine dipstick: NEGATIVE
Ketones, ur: NEGATIVE mg/dL
LEUKOCYTES UA: NEGATIVE
Nitrite: NEGATIVE
PROTEIN: NEGATIVE mg/dL
SPECIFIC GRAVITY, URINE: 1.013 (ref 1.005–1.030)
pH: 6 (ref 5.0–8.0)

## 2018-08-22 LAB — CBC
HCT: 40.9 % (ref 40.0–52.0)
HEMOGLOBIN: 13.8 g/dL (ref 13.0–18.0)
MCH: 33.6 pg (ref 26.0–34.0)
MCHC: 33.8 g/dL (ref 32.0–36.0)
MCV: 99.3 fL (ref 80.0–100.0)
Platelets: 171 10*3/uL (ref 150–440)
RBC: 4.12 MIL/uL — AB (ref 4.40–5.90)
RDW: 13.2 % (ref 11.5–14.5)
WBC: 5.3 10*3/uL (ref 3.8–10.6)

## 2018-08-22 LAB — COMPREHENSIVE METABOLIC PANEL
ALBUMIN: 4.4 g/dL (ref 3.5–5.0)
ALK PHOS: 72 U/L (ref 38–126)
ALT: 17 U/L (ref 0–44)
ANION GAP: 12 (ref 5–15)
AST: 20 U/L (ref 15–41)
BILIRUBIN TOTAL: 1 mg/dL (ref 0.3–1.2)
BUN: 19 mg/dL (ref 8–23)
CALCIUM: 9.6 mg/dL (ref 8.9–10.3)
CO2: 22 mmol/L (ref 22–32)
CREATININE: 0.68 mg/dL (ref 0.61–1.24)
Chloride: 100 mmol/L (ref 98–111)
GFR calc Af Amer: >60 mL/min (ref >60)
GFR calc non Af Amer: >60 mL/min (ref >60)
GLUCOSE: 114 mg/dL — AB (ref 70–99)
Potassium: 4.8 mmol/L (ref 3.5–5.1)
SODIUM: 134 mmol/L — AB (ref 135–145)
TOTAL PROTEIN: 7.4 g/dL (ref 6.5–8.1)

## 2018-08-22 LAB — URINE DRUG SCREEN, QUALITATIVE (ARMC ONLY)
AMPHETAMINES, UR SCREEN: NOT DETECTED
BENZODIAZEPINE, UR SCRN: NOT DETECTED
Barbiturates, Ur Screen: NOT DETECTED
COCAINE METABOLITE, UR ~~LOC~~: NOT DETECTED
Cannabinoid 50 Ng, Ur ~~LOC~~: NOT DETECTED
MDMA (ECSTASY) UR SCREEN: NOT DETECTED
METHADONE SCREEN, URINE: NOT DETECTED
OPIATE, UR SCREEN: NOT DETECTED
Phencyclidine (PCP) Ur S: NOT DETECTED
Tricyclic, Ur Screen: NOT DETECTED

## 2018-08-22 LAB — PROTIME-INR
INR: 0.88
Prothrombin Time: 11.9 seconds (ref 11.4–15.2)

## 2018-08-22 LAB — DIFFERENTIAL
Basophils Absolute: 0 10*3/uL (ref 0–0.1)
Basophils Relative: 1 %
EOS PCT: 1 %
Eosinophils Absolute: 0 10*3/uL (ref 0–0.7)
LYMPHS ABS: 1.2 10*3/uL (ref 1.0–3.6)
LYMPHS PCT: 22 %
Monocytes Absolute: 0.4 10*3/uL (ref 0.2–1.0)
Monocytes Relative: 8 %
NEUTROS ABS: 3.6 10*3/uL (ref 1.4–6.5)
NEUTROS PCT: 68 %

## 2018-08-22 LAB — GLUCOSE, CAPILLARY: Glucose-Capillary: 108 mg/dL — ABNORMAL HIGH (ref 70–99)

## 2018-08-22 LAB — APTT: aPTT: 29 seconds (ref 24–36)

## 2018-08-22 LAB — ETHANOL: Alcohol, Ethyl (B): <10 mg/dL (ref <10)

## 2018-08-22 LAB — TROPONIN I: Troponin I: <0.03 ng/mL (ref <0.03)

## 2018-08-22 MED ORDER — ENOXAPARIN SODIUM 40 MG/0.4ML ~~LOC~~ SOLN
40.0000 mg | SUBCUTANEOUS | Status: DC
  Administered 2018-08-22: 18:00:00 40 mg via SUBCUTANEOUS
  Filled 2018-08-22: qty 0.4

## 2018-08-22 MED ORDER — LORATADINE 10 MG PO TABS
10.0000 mg | ORAL_TABLET | Freq: Every day | ORAL | Status: DC
  Administered 2018-08-23: 10 mg via ORAL
  Filled 2018-08-22: qty 1

## 2018-08-22 MED ORDER — ASPIRIN EC 81 MG PO TBEC
81.0000 mg | DELAYED_RELEASE_TABLET | Freq: Every day | ORAL | Status: DC
  Administered 2018-08-23: 81 mg via ORAL
  Filled 2018-08-22 (×3): qty 1

## 2018-08-22 MED ORDER — MAGNESIUM HYDROXIDE 400 MG/5ML PO SUSP
30.0000 mL | Freq: Every day | ORAL | Status: DC | PRN
  Filled 2018-08-22: qty 30

## 2018-08-22 MED ORDER — SERTRALINE HCL 50 MG PO TABS
25.0000 mg | ORAL_TABLET | Freq: Every day | ORAL | Status: DC
  Filled 2018-08-22: qty 1

## 2018-08-22 MED ORDER — VITAMIN C 500 MG PO TABS
500.0000 mg | ORAL_TABLET | Freq: Every day | ORAL | Status: DC
  Administered 2018-08-23: 500 mg via ORAL
  Filled 2018-08-22: qty 1

## 2018-08-22 MED ORDER — ASPIRIN 81 MG PO CHEW
324.0000 mg | CHEWABLE_TABLET | Freq: Once | ORAL | Status: AC
  Administered 2018-08-22: 324 mg via ORAL
  Filled 2018-08-22: qty 4

## 2018-08-22 MED ORDER — ONDANSETRON HCL 4 MG PO TABS: 4.0000 mg | ORAL_TABLET | Freq: Four times a day (QID) | ORAL | Status: DC | PRN

## 2018-08-22 MED ORDER — MECLIZINE HCL 25 MG PO TABS
25.0000 mg | ORAL_TABLET | Freq: Once | ORAL | Status: AC
  Administered 2018-08-22: 25 mg via ORAL
  Filled 2018-08-22: qty 1

## 2018-08-22 MED ORDER — CLONAZEPAM 0.5 MG PO TABS
0.5000 mg | ORAL_TABLET | Freq: Three times a day (TID) | ORAL | Status: DC
  Administered 2018-08-22 – 2018-08-23 (×3): 0.5 mg via ORAL
  Filled 2018-08-22 (×3): qty 1

## 2018-08-22 MED ORDER — TRAZODONE HCL 50 MG PO TABS
50.0000 mg | ORAL_TABLET | Freq: Every day | ORAL | Status: DC
  Administered 2018-08-22: 22:00:00 50 mg via ORAL
  Filled 2018-08-22: qty 1

## 2018-08-22 MED ORDER — ONDANSETRON HCL 4 MG/2ML IJ SOLN: 4.0000 mg | Freq: Four times a day (QID) | INTRAMUSCULAR | Status: DC | PRN

## 2018-08-22 MED ORDER — ACETAMINOPHEN 325 MG PO TABS: 650.0000 mg | ORAL_TABLET | Freq: Four times a day (QID) | ORAL | Status: DC | PRN

## 2018-08-22 MED ORDER — MULTIVITAMINS PO CAPS: 1.0000 | ORAL_CAPSULE | Freq: Every day | ORAL | Status: DC

## 2018-08-22 MED ORDER — SODIUM CHLORIDE 0.9 % IV BOLUS
500.0000 mL | Freq: Once | INTRAVENOUS | Status: AC
  Administered 2018-08-22: 500 mL via INTRAVENOUS

## 2018-08-22 MED ORDER — MECLIZINE HCL 12.5 MG PO TABS
12.5000 mg | ORAL_TABLET | Freq: Three times a day (TID) | ORAL | Status: DC | PRN
  Filled 2018-08-22: qty 1

## 2018-08-22 MED ORDER — LISINOPRIL 10 MG PO TABS
10.0000 mg | ORAL_TABLET | Freq: Every day | ORAL | Status: DC
  Administered 2018-08-23: 09:00:00 10 mg via ORAL
  Filled 2018-08-22: qty 1

## 2018-08-22 MED ORDER — ACETAMINOPHEN 650 MG RE SUPP: 650.0000 mg | Freq: Four times a day (QID) | RECTAL | Status: DC | PRN

## 2018-08-22 MED ORDER — HYDRALAZINE HCL 20 MG/ML IJ SOLN: 10.0000 mg | Freq: Four times a day (QID) | INTRAMUSCULAR | Status: DC | PRN

## 2018-08-22 MED ORDER — ADULT MULTIVITAMIN W/MINERALS CH
1.0000 | ORAL_TABLET | Freq: Every day | ORAL | Status: DC
  Administered 2018-08-23: 1 via ORAL
  Filled 2018-08-22: qty 1

## 2018-08-22 MED ORDER — PREDNISONE 20 MG PO TABS
50.0000 mg | ORAL_TABLET | Freq: Every day | ORAL | Status: DC
  Administered 2018-08-22 – 2018-08-23 (×2): 50 mg via ORAL
  Filled 2018-08-22 (×2): qty 1

## 2018-08-22 NOTE — ED Notes (Signed)
Pt to ultrasound

## 2018-08-22 NOTE — Consult Note (Addendum)
[facility] Tele[facility]urology Consult Services   Date of Service:   08/22/2018 11:12:47  Impression:     .  RO Acute Ischemic Stroke  Comments: the patient has imbalance that was not clearly defined. When he stood during the examination he feels like his knees might give way. There is nothing focal on neurologic assessment he does not appear to be ataxic in either limb. He also denies lightheadedness or a sense of faintness or motion. While TIA or small stroke is a possibility suspect this is less likely than an orthostatic or vestibular issue.  Metrics: Last Known Well: 08/22/2018 10:00:00 [facility] Notification Time: 08/22/2018 11:11:37 Arrival Time: 08/22/2018 10:38:00 Stamp Time: 08/22/2018 11:12:47 Time First Login Attempt: 08/22/2018 11:16:00 Video Start Time: 08/22/2018 11:16:00  Symptoms: a sense of imbalance global weakness NIHSS Start Assessment Time: 08/22/2018 11:16:00 Patient is not a candidate for tPA due to lack of focality on examination NIH of zero. Symptoms not c/w LVO no role for CTAS or  NIR emergently Video End Time: 08/22/2018 11:30:00  CT head showed no acute hemorrhage or acute core infarct. ER physician notified of the decision on thrombolytics management.  Our recommendations are outlined below.  Recommendations:     .  Antiplatelet Therapy Recommended while diagnosis sorted OVS/PT/OT eval Routine Consultation with [facility] for Follow up Care  Sign Out:     .  Discussed with Emergency Department Provider     ------------------------------------------------------------------------------  History of Present Illness: Patient is a 84 years old Male.  Patient was brought by EMS for symptoms of a sense of imbalance global weakness  84 year old man with a history of hypertension and torticollis was at church today and felt a sense of imbalance. It sounds like he was getting up to stand for Communion. He denies lightheadedness or any  sense of vertigo. There is no other focal weakness numbness or tingling. A similar event one year ago occurred but was not as long lasting and he did not seek treatment. He denies feeling faint or like a past out. But also doesn't have necessarily a sense of motion.  CT head showed no acute hemorrhage or acute core infarct. BP normal in 130s  Examination: 1A: Level of Consciousness - Alert; keenly responsive + 0 1B: Ask Month and Age - Both Questions Right + 0 1C: Blink Eyes & Squeeze Hands - Performs Both Tasks + 0 2: Test Horizontal Extraocular Movements - Normal + 0 3: Test Visual Fields - No Visual Loss + 0 4: Test Facial Palsy (Use Grimace if Obtunded) - Normal symmetry + 0 5A: Test Left Arm Motor Drift - No Drift for 10 Seconds + 0 5B: Test Right Arm Motor Drift - No Drift for 10 Seconds + 0 6A: Test Left Leg Motor Drift - No Drift for 5 Seconds + 0 6B: Test Right Leg Motor Drift - No Drift for 5 Seconds + 0 7: Test Limb Ataxia (FNF/Heel-Shin) - No Ataxia + 0 8: Test Sensation - Normal; No sensory loss + 0 9: Test Language/Aphasia - Normal; No aphasia + 0 10: Test Dysarthria - Normal + 0 11: Test Extinction/Inattention - No abnormality + 0  NIHSS Score: 0  Patient was informed the Neurology Consult would happen via TeleHealth consult by way of interactive audio and video telecommunications and consented to receiving care in this manner.  Due to the immediate potential for life-threatening deterioration due to underlying acute neurologic illness, I spent 35 minutes providing critical care. This time includes time for  face to face visit via telemedicine, review of medical records, imaging studies and discussion of findings with providers, the patient and/or family.   Dr Katina Degree   [facility] [phone]

## 2018-08-22 NOTE — ED Notes (Signed)
Pt to MRI

## 2018-08-22 NOTE — ED Notes (Addendum)
Pt resting in bed with wife at bedside. No distress noted.

## 2018-08-22 NOTE — ED Triage Notes (Signed)
Pt to ED via POV c/o dizziness that started this morning. Pt states that he was sitting in church and when he stood up he got very dizzy. Pt states when he stood up he felt unsteady on his feet. Pt states that he still feels unsteady at this time. Pt is in NAD at this time.

## 2018-08-22 NOTE — Progress Notes (Signed)
   08/22/18 1445  Clinical Encounter Type  Visited With Patient and family together  Visit Type Follow-up;Spiritual support  Spiritual Encounters  Spiritual Needs Emotional;Prayer   As patient was still in ED, chaplain followed up.  Patient reported on tests done and information learned.  He spoke of the plans to admit him and continue exploration as to what is causing his symptoms.  Patient requested prayer which chaplain led with patient and his spouse.  Chaplain offered to follow up after patient's transfer to the floor, but patient and spouse indicated that they would have staff page for chaplain if needed.

## 2018-08-22 NOTE — ED Notes (Signed)
First Nurse Note: Patient to ED via [place] from [place] with complaint of sudden onset of dizziness approx 30 min. Ago.  Alert and oriented, speech is clear, NAD.  Denies nausea.

## 2018-08-22 NOTE — H&P (Signed)
[facility] NAME: Stuart Smith    MR#:  [id number]  DATE OF BIRTH:  [birth year]  DATE OF ADMISSION:  08/22/2018  PRIMARY CARE PHYSICIAN: Maryland Pink, MD   REQUESTING/REFERRING PHYSICIAN: Dr. Rudene Re  CHIEF COMPLAINT:   Chief Complaint  Patient presents with  . Dizziness    HISTORY OF PRESENT ILLNESS:  Stuart Smith  is a 84 y.o. male with a known history of prostate cancer, depression, hypertension presents to hospital secondary to worsening ataxia that started this morning. Patient states that he was completely in his normal state of health when he woke up this morning, had breakfast went to church.  When he stood up for the communion in the church she suddenly felt his gait was very unsteady could not walk.  He had to hold onto things to walk.  He feels fine as long as he is laying down.  But when he gets up or changes his position he feels very ataxic.  Denies any dizziness, nausea or vomiting.  No changes in medications.  No recent travel.  MRI of the brain is negative for any acute infarcts.  Denies any hearing loss or ringing in his ears.  He has bilateral hearing loss and wears hearing aids.  But no change to hearing noted.  No diplopia or double vision.  No tingling numbness or motor changes.  PAST MEDICAL HISTORY:   Past Medical History:  Diagnosis Date  . Cataracts, bilateral   . Depression   . Elevated PSA   . H/O seasonal allergies   . History of chickenpox   . Prostate cancer [facility]) 2013   Surgery   . Torticollis     PAST SURGICAL HISTORY:   Past Surgical History:  Procedure Laterality Date  . BACK SURGERY  1995  . GALLBLADDER SURGERY    . PROSTATE SURGERY  2013    SOCIAL HISTORY:   Social History   Tobacco Use  . Smoking status: Never Smoker  . Smokeless tobacco: Never Used  Substance Use Topics  . Alcohol use: Yes    Alcohol/week: 1.0 standard drinks    Types: 1 Standard drinks or  equivalent per week    FAMILY HISTORY:   Family History  Family history unknown: Yes    DRUG ALLERGIES:   Allergies  Allergen Reactions  . Keflex [Cephalexin]     REVIEW OF SYSTEMS:   Review of Systems  Constitutional: Negative for chills, fever, malaise/fatigue and weight loss.  HENT: Negative for ear discharge, ear pain, hearing loss, nosebleeds and tinnitus.   Eyes: Negative for blurred vision, double vision and photophobia.  Respiratory: Negative for cough, hemoptysis, shortness of breath and wheezing.   Cardiovascular: Negative for chest pain, palpitations, orthopnea and leg swelling.  Gastrointestinal: Negative for abdominal pain, constipation, diarrhea, heartburn, melena, nausea and vomiting.  Genitourinary: Negative for dysuria, frequency, hematuria and urgency.  Musculoskeletal: Negative for back pain, myalgias and neck pain.  Skin: Negative for rash.  Neurological: Positive for dizziness. Negative for tingling, tremors, sensory change, speech change, focal weakness and headaches.       Ataxia  Endo/Heme/Allergies: Does not bruise/bleed easily.  Psychiatric/Behavioral: Negative for depression.    MEDICATIONS AT HOME:   Prior to Admission medications   Medication Sig Start Date End Date Taking? Authorizing Provider  aspirin 81 MG tablet Take 81 mg by mouth daily.   Yes [provider]  cetirizine (ZYRTEC) 10 MG tablet Take 10 mg  by mouth daily.   Yes [provider]  clonazePAM (KLONOPIN) 0.5 MG tablet Take 0.5 mg by mouth 3 (three) times daily.   Yes [provider]  Flaxseed Oil OIL Take by mouth.   Yes [provider]  lisinopril (PRINIVIL,ZESTRIL) 10 MG tablet Take 10 mg by mouth daily.   Yes [provider]  magnesium hydroxide (MILK OF MAGNESIA) 400 MG/5ML suspension Take 30 mLs by mouth daily as needed.    Yes [provider]  Multiple Vitamin (MULTIVITAMIN) capsule Take 1 capsule by mouth daily.   Yes  [provider]  traZODone (DESYREL) 50 MG tablet Take 50 mg by mouth at bedtime.   Yes [provider]  vitamin C (ASCORBIC ACID) 500 MG tablet Take 500 mg by mouth daily.    Yes [provider]  Leuprolide Acetate, 6 Month, (LUPRON DEPOT, 29-MONTH,) 45 MG injection Inject 45 mg into the muscle every 6 (six) months.    [provider]  sertraline (ZOLOFT) 25 MG tablet Take 25 mg by mouth at bedtime.    [provider]      VITAL SIGNS:  Blood pressure (!) 176/91, pulse 80, temperature (!) 97.5 F (36.4 C), temperature source Oral, resp. rate 14, height 5\' 9"  (1.753 m), weight 83.9 kg, SpO2 99 %.  PHYSICAL EXAMINATION:   Physical Exam  GENERAL:  84 y.o.-year-old patient lying in the bed with no acute distress.  EYES: Pupils equal, round, reactive to light and accommodation. No scleral icterus. Extraocular muscles intact.  HEENT: Head atraumatic, normocephalic. Oropharynx and nasopharynx clear.  NECK:  Supple, no jugular venous distention. No thyroid enlargement, no tenderness.  LUNGS: Normal breath sounds bilaterally, no wheezing, rales,rhonchi or crepitation. No use of accessory muscles of respiration.  CARDIOVASCULAR: S1, S2 normal. No rubs, or gallops. 2/6 systolic murmur present ABDOMEN: Soft, nontender, nondistended. Bowel sounds present. No organomegaly or mass.  EXTREMITIES: No pedal edema, cyanosis, or clubbing.  NEUROLOGIC: Cranial nerves II through XII are intact. Muscle strength 5/5 in all extremities. Sensation intact. Gait not checked.  PSYCHIATRIC: The patient is alert and oriented x 3.  SKIN: No obvious rash, lesion, or ulcer.   LABORATORY PANEL:   CBC Recent Labs  Lab 08/22/18 1114  WBC 5.3  HGB 13.8  HCT 40.9  PLT 171   ------------------------------------------------------------------------------------------------------------------  Chemistries  Recent Labs  Lab 08/22/18 1114  NA 134*  K 4.8  CL 100  CO2 22    GLUCOSE 114*  BUN 19  CREATININE 0.68  CALCIUM 9.6  AST 20  ALT 17  ALKPHOS 72  BILITOT 1.0   ------------------------------------------------------------------------------------------------------------------  Cardiac Enzymes Recent Labs  Lab 08/22/18 1114  TROPONINI <0.03   ------------------------------------------------------------------------------------------------------------------  RADIOLOGY:  Mr Brain Wo Contrast  Result Date: 08/22/2018 CLINICAL DATA:  84 year old male with sudden onset dizziness. Code stroke. EXAM: MRI HEAD WITHOUT CONTRAST TECHNIQUE: Multiplanar, multiecho pulse sequences of the brain and surrounding structures were obtained without intravenous contrast. COMPARISON:  Head CT without contrast 1110 hours today. FINDINGS: Brain: No restricted diffusion to suggest acute infarction. No midline shift, mass effect, evidence of mass lesion, ventriculomegaly, extra-axial collection or acute intracranial hemorrhage. Cervicomedullary junction and pituitary are within normal limits. Pearline Cables and white matter signal is within normal limits for age throughout the brain. No encephalomalacia or chronic cerebral blood products identified. Vascular: Major intracranial vascular flow voids are preserved. Skull and upper cervical spine: Bulky degenerative ligamentous hypertrophy about the odontoid effaces the ventral CSF space but there  is no significant cervicomedullary junction stenosis (series 11, image 13). Partial erosion of the odontoid. Superimposed incomplete segmentation of the C2-C3 cervical levels. Background bone marrow signal is normal. Sinuses/Orbits: Postoperative changes to both globes. Paranasal Visualized paranasal sinuses and mastoids are stable and well pneumatized. Other: Visible internal auditory structures appear grossly normal. Normal stylomastoid foramina. Scalp and face soft tissues appear negative. IMPRESSION: 1. Normal for age noncontrast MRI appearance of the  brain. 2. Bulky ligamentous hypertrophy about the dens with evidence of some bony erosion raises the possibility of underlying rheumatoid arthropathy. Alternatively, this could be advanced osteoarthritis as the C2-C3 spinal segments are congenitally fused. Electronically Signed   By: Genevie Ann M.D.   On: 08/22/2018 13:30   Ct Head Code Stroke Wo Contrast  Addendum Date: 08/22/2018   ADDENDUM REPORT: 08/22/2018 11:38 ADDENDUM: Study discussed by telephone with Dr. Rudene Re on 08/22/2018 at 1135 hours. Electronically Signed   By: Genevie Ann M.D.   On: 08/22/2018 11:38   Result Date: 08/22/2018 CLINICAL DATA:  Code stroke. 84 year old male with sudden onset dizziness. EXAM: CT HEAD WITHOUT CONTRAST TECHNIQUE: Contiguous axial images were obtained from the base of the skull through the vertex without intravenous contrast. COMPARISON:  None. FINDINGS: Brain: Cerebral volume is within normal limits for age. No midline shift, ventriculomegaly, mass effect, evidence of mass lesion, intracranial hemorrhage or evidence of cortically based acute infarction. Gray-white matter differentiation is normal for age throughout the brain. No cortical encephalomalacia identified. Vascular: Calcified atherosclerosis at the skull base. No suspicious intracranial vascular hyperdensity. Skull: Negative. Sinuses/Orbits: Visualized paranasal sinuses and mastoids are well pneumatized. Other: Postoperative changes to both globes. Negative scalp soft tissues. ASPECTS [facility] Stroke Program Early CT Score) - Ganglionic level infarction (caudate, lentiform nuclei, internal capsule, insula, M1-M3 cortex): 7 - Supraganglionic infarction (M4-M6 cortex): 3 Total score (0-10 with 10 being normal): 10 IMPRESSION: 1. Normal for age non contrast CT appearance of the brain. 2. ASPECTS is 10. Electronically Signed: By: Genevie Ann M.D. On: 08/22/2018 11:18    EKG:   Orders placed or performed during the hospital encounter of 08/22/18  . EKG  12-Lead  . EKG 12-Lead  . ED EKG  . ED EKG    IMPRESSION AND PLAN:   Aryan Sparks  is a 84 y.o. male with a known history of prostate cancer, depression, hypertension presents to hospital secondary to worsening ataxia that started this morning.  1.  Ataxia and unsteadiness- significantly positional -Could be acute vestibular neuritis.  No hearing loss or ringing in his ears. -Started first time 2 days ago when he bent his head and suddenly lifted it, not sure if he dislodged canaliths in inner ear -Stroke work-up already done with MRI being negative for any acute findings.  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 -Follow-up on carotid Dopplers.  If blood pressures remain elevated and abnormal carotid Dopplers-then will get a CT angiogram -Continue his aspirin -Meclizine as needed.  Also prednisone added -Urine analysis is pending  2.  Depression anxiety-continue home medications.  Patient on Zoloft, trazodone and Klonopin  3.  Hypertension-lisinopril.  IV hydralazine PRN  4.  History of prostate cancer-follows up outpatient.  Continue Lupron every 6 months  5.  DVT prophylaxis-Lovenox  Physical therapy consulted    All the records are reviewed and case discussed with ED provider. Management plans discussed with the patient, family and they are in agreement.  CODE STATUS: Full Code  TOTAL TIME TAKING CARE OF  THIS PATIENT: 50  minutes.    Dayton Sherr M.D on 08/22/2018 at 2:46 PM  Between 7am to 6pm - Pager - [phone]  After 6pm go to [email or web address] - password [email or web address] [email or web address]  Office  [phone]  CC: Primary care physician; Maryland Pink, MD

## 2018-08-22 NOTE — Plan of Care (Signed)
Pt admitted from the ED.  VSS. Denies pain. NIH 0. Denies dizziness while in bed.

## 2018-08-22 NOTE — ED Provider Notes (Signed)
[facility] Emergency Department Provider Note  ____________________________________________  Time seen: Approximately 11:05 AM  I have reviewed the triage vital signs and the nursing notes.   HISTORY  Chief Complaint Dizziness   HPI Stuart Smith is a 84 y.o. male with a history of prostate cancer status post surgery on Lupron therapy, HTN, HLD who presents for evaluation of dizziness.  Patient reports that he was at church when he developed sudden onset of feeling off balance.  He denies feeling like he is going to pass out or room spinning sensation.  He reports that he is unable to walk due to feeling extremely off balance.  He denies facial droop, slurred speech, unilateral weakness or numbness.  No prior history of stroke.  No family history of stroke.  No history of atrial fibrillation.  He is not on blood thinners.  He denies chest pain or headache.  His symptoms improve while at rest but did not fully resolve and are worse when he tries to stand up and walk.  They have been constant since 30 minutes prior to arrival.  Last seen normal was at 10 AM.  Patient reports that 2 days ago he leaned forward to wash his hair in the tub and had sudden onset of severe dizziness although that only lasted a few minutes and he felt back to normal.  Past Medical History:  Diagnosis Date  . Cataracts, bilateral   . Depression   . Elevated PSA   . H/O seasonal allergies   . History of chickenpox   . Prostate cancer [facility]) 2013   Surgery   . Torticollis     Patient Active Problem List   Diagnosis Date Noted  . Gait instability 10/27/2016  . Essential hypertension 07/25/2014  . Atherosclerosis of abdominal aorta ([place]) 05/19/2013  . Abnormal EKG 08/09/2012  . Prostate cancer ([place]) 08/09/2012    Past Surgical History:  Procedure Laterality Date  . BACK SURGERY  1995  . GALLBLADDER SURGERY    . PROSTATE SURGERY  2013    Prior to Admission medications     Medication Sig Start Date End Date Taking? Authorizing Provider  aspirin 81 MG tablet Take 81 mg by mouth daily.    [provider]  cetirizine (ZYRTEC) 10 MG tablet Take 10 mg by mouth daily.    [provider]  clonazePAM (KLONOPIN) 0.5 MG tablet Take 0.5 mg by mouth 3 (three) times daily.    [provider]  Flaxseed Oil OIL Take by mouth.    [provider]  Leuprolide Acetate, 6 Month, (LUPRON DEPOT, 58-MONTH,) 45 MG injection Inject 45 mg into the muscle every 6 (six) months.    [provider]  lisinopril (PRINIVIL,ZESTRIL) 10 MG tablet Take 10 mg by mouth daily.    [provider]  magnesium hydroxide (MILK OF MAGNESIA) 400 MG/5ML suspension Take by mouth.    [provider]  Multiple Vitamin (MULTIVITAMIN) capsule Take 1 capsule by mouth daily.    [provider]  sertraline (ZOLOFT) 25 MG tablet Take 25 mg by mouth at bedtime.    [provider]  traZODone (DESYREL) 50 MG tablet Take 50 mg by mouth at bedtime.    [provider]  vitamin C (ASCORBIC ACID) 500 MG tablet Take by mouth.    [provider]    Allergies Keflex [cephalexin]  Family History  Family history unknown: Yes    Social History Social History   Tobacco  Use  . Smoking status: Never Smoker  . Smokeless tobacco: Never Used  Substance Use Topics  . Alcohol use: Yes    Alcohol/week: 1.0 standard drinks    Types: 1 Standard drinks or equivalent per week  . Drug use: No    Review of Systems  Constitutional: Negative for fever. Eyes: Negative for visual changes. ENT: Negative for sore throat. Neck: No neck pain  Cardiovascular: Negative for chest pain. Respiratory: Negative for shortness of breath. Gastrointestinal: Negative for abdominal pain, vomiting or diarrhea. Genitourinary: Negative for dysuria. Musculoskeletal: Negative for back pain. Skin: Negative for rash. Neurological: Negative for  headaches, weakness or numbness. + dizziness Psych: No SI or HI  ____________________________________________   PHYSICAL EXAM:  VITAL SIGNS: ED Triage Vitals  Enc Vitals Group     BP 08/22/18 1043 (!) 172/74     Pulse Rate 08/22/18 1043 90     Resp 08/22/18 1043 18     Temp 08/22/18 1043 (!) 97.5 F (36.4 C)     Temp Source 08/22/18 1043 Oral     SpO2 08/22/18 1043 99 %     Weight 08/22/18 1043 185 lb (83.9 kg)     Height 08/22/18 1043 5\' 9"  (1.753 m)     Head Circumference --      Peak Flow --      Pain Score 08/22/18 1050 0     Pain Loc --      Pain Edu? --      Excl. in [place]? --     Constitutional: Alert and oriented. Well appearing and in no apparent distress. HEENT:      Head: Normocephalic and atraumatic.         Eyes: Conjunctivae are normal. Sclera is non-icteric.       Mouth/Throat: Mucous membranes are moist.       Neck: Supple with no signs of meningismus. Cardiovascular: Regular rate and rhythm. No murmurs, gallops, or rubs. 2+ symmetrical distal pulses are present in all extremities. No JVD. Respiratory: Normal respiratory effort. Lungs are clear to auscultation bilaterally. No wheezes, crackles, or rhonchi.  Gastrointestinal: Soft, non tender, and non distended with positive bowel sounds. No rebound or guarding. Musculoskeletal: Nontender with normal range of motion in all extremities. No edema, cyanosis, or erythema of extremities. Neurologic: Normal speech and language. A & O x3, PERRL, EOMI, no nystagmus, CN II-XII intact, motor testing reveals good tone and bulk throughout. There is no evidence of pronator drift or dysmetria. Muscle strength is 5/5 throughout. Sensory examination is intact. Gait is ataxic Skin: Skin is warm, dry and intact. No rash noted. Psychiatric: Mood and affect are normal. Speech and behavior are normal.  ____________________________________________   LABS (all labs ordered are listed, but only abnormal results are displayed)  Labs  Reviewed  GLUCOSE, CAPILLARY - Abnormal; Notable for the following components:      Result Value   Glucose-Capillary 108 (*)    All other components within normal limits  CBC - Abnormal; Notable for the following components:   RBC 4.12 (*)    All other components within normal limits  COMPREHENSIVE METABOLIC PANEL - Abnormal; Notable for the following components:   Sodium 134 (*)    Glucose, Bld 114 (*)    All other components within normal limits  ETHANOL  PROTIME-INR  APTT  DIFFERENTIAL  TROPONIN I  URINE DRUG SCREEN, QUALITATIVE ([facility] ONLY)  URINALYSIS, ROUTINE W REFLEX MICROSCOPIC   ____________________________________________  EKG  ED ECG REPORT I,  Rudene Re, the attending physician, personally viewed and interpreted this ECG.  Normal sinus rhythm, rate of 94, right bundle branch block, left axis deviation, no ST elevations or depressions.  Unchanged from prior from 12/2017 ____________________________________________  RADIOLOGY  I have personally reviewed the images performed during this visit and I agree with the Radiologist's read.   Interpretation by Radiologist:  Mr Brain Wo Contrast  Result Date: 08/22/2018 CLINICAL DATA:  84 year old male with sudden onset dizziness. Code stroke. EXAM: MRI HEAD WITHOUT CONTRAST TECHNIQUE: Multiplanar, multiecho pulse sequences of the brain and surrounding structures were obtained without intravenous contrast. COMPARISON:  Head CT without contrast 1110 hours today. FINDINGS: Brain: No restricted diffusion to suggest acute infarction. No midline shift, mass effect, evidence of mass lesion, ventriculomegaly, extra-axial collection or acute intracranial hemorrhage. Cervicomedullary junction and pituitary are within normal limits. Pearline Cables and white matter signal is within normal limits for age throughout the brain. No encephalomalacia or chronic cerebral blood products identified. Vascular: Major intracranial vascular flow voids are  preserved. Skull and upper cervical spine: Bulky degenerative ligamentous hypertrophy about the odontoid effaces the ventral CSF space but there is no significant cervicomedullary junction stenosis (series 11, image 13). Partial erosion of the odontoid. Superimposed incomplete segmentation of the C2-C3 cervical levels. Background bone marrow signal is normal. Sinuses/Orbits: Postoperative changes to both globes. Paranasal Visualized paranasal sinuses and mastoids are stable and well pneumatized. Other: Visible internal auditory structures appear grossly normal. Normal stylomastoid foramina. Scalp and face soft tissues appear negative. IMPRESSION: 1. Normal for age noncontrast MRI appearance of the brain. 2. Bulky ligamentous hypertrophy about the dens with evidence of some bony erosion raises the possibility of underlying rheumatoid arthropathy. Alternatively, this could be advanced osteoarthritis as the C2-C3 spinal segments are congenitally fused. Electronically Signed   By: Genevie Ann M.D.   On: 08/22/2018 13:30   Ct Head Code Stroke Wo Contrast  Addendum Date: 08/22/2018   ADDENDUM REPORT: 08/22/2018 11:38 ADDENDUM: Study discussed by telephone with Dr. Rudene Re on 08/22/2018 at 1135 hours. Electronically Signed   By: Genevie Ann M.D.   On: 08/22/2018 11:38   Result Date: 08/22/2018 CLINICAL DATA:  Code stroke. 84 year old male with sudden onset dizziness. EXAM: CT HEAD WITHOUT CONTRAST TECHNIQUE: Contiguous axial images were obtained from the base of the skull through the vertex without intravenous contrast. COMPARISON:  None. FINDINGS: Brain: Cerebral volume is within normal limits for age. No midline shift, ventriculomegaly, mass effect, evidence of mass lesion, intracranial hemorrhage or evidence of cortically based acute infarction. Gray-white matter differentiation is normal for age throughout the brain. No cortical encephalomalacia identified. Vascular: Calcified atherosclerosis at the skull base.  No suspicious intracranial vascular hyperdensity. Skull: Negative. Sinuses/Orbits: Visualized paranasal sinuses and mastoids are well pneumatized. Other: Postoperative changes to both globes. Negative scalp soft tissues. ASPECTS [facility] Stroke Program Early CT Score) - Ganglionic level infarction (caudate, lentiform nuclei, internal capsule, insula, M1-M3 cortex): 7 - Supraganglionic infarction (M4-M6 cortex): 3 Total score (0-10 with 10 being normal): 10 IMPRESSION: 1. Normal for age non contrast CT appearance of the brain. 2. ASPECTS is 10. Electronically Signed: By: Genevie Ann M.D. On: 08/22/2018 11:18      ____________________________________________   PROCEDURES  Procedure(s) performed: None Procedures Critical Care performed:  None ____________________________________________   INITIAL IMPRESSION / ASSESSMENT AND PLAN / ED COURSE  84 y.o. male with a history of prostate cancer status post surgery on Lupron therapy, HTN, HLD who presents for evaluation of dizziness.  Patient  presents with sudden onset of disequilibrium, unable to walk.  Denies feeling faint or room spinning sensation.  He is otherwise neurologically intact.  Patient has elevated BP of 172/74.  Code STROKE was called. BG 108. Ddx stroke vs peripheral vertigo. Labs, head CT, and neurology consult pending.  Clinical Course as of Aug 22 1417  Sun Aug 22, 2018  1416 MRI negative for stroke.  After fluids and meclizine patient continues to have gait instability and is unable to ambulate safely.  Per recommendation of neurology will admit to the hospitalist service   [CV]    Clinical Course User Index [CV] Alfred Levins [place], MD     As part of my medical decision making, I reviewed the following data within the [place] History obtained from family, Nursing notes reviewed and incorporated, Labs reviewed , EKG interpreted , Old EKG reviewed, Old chart reviewed, Radiograph reviewed , Discussed with admitting  physician , A consult was requested and obtained from this/these consultant(s) Neurology, Notes from prior ED visits and [place] Controlled Substance Database    Pertinent labs & imaging results that were available during my care of the patient were reviewed by me and considered in my medical decision making (see chart for details).    ____________________________________________   FINAL CLINICAL IMPRESSION(S) / ED DIAGNOSES  Final diagnoses:  Dizziness      NEW MEDICATIONS STARTED DURING THIS VISIT:  ED Discharge Orders    None       Note:  This document was prepared using Dragon voice recognition software and may include unintentional dictation errors.    Alfred Levins, [place], MD 08/22/18 [phone]

## 2018-08-22 NOTE — Progress Notes (Signed)
   08/22/18 1058  Clinical Encounter Type  Visited With Patient and family together;Health care provider  Visit Type Code;Initial  Spiritual Encounters  Spiritual Needs Emotional;Prayer  Stress Factors  Patient Stress Factors Health changes   Chaplain responded to code stroke, met patient's wife Gae Bon (nine-ah) in room, offered emotional support as she reviewed morning's events and patient's history of related events.  Patient returned to room; chaplain offered silent and energetic prayers for patient, spouse and care team during treatment.  Chaplain received page, encouraged spouse to have chaplain paged as needed.

## 2018-08-23 ENCOUNTER — Observation Stay (HOSPITAL_BASED_OUTPATIENT_CLINIC_OR_DEPARTMENT_OTHER)
Admit: 2018-08-23 | Discharge: 2018-08-23 | Disposition: A | Discharge status: Home / Self Care | Payer: Medicare Other | Attending: Specialist | Admitting: Specialist

## 2018-08-23 DIAGNOSIS — H933X9 Disorders of unspecified acoustic nerve: Secondary | ICD-10-CM | POA: Diagnosis not present

## 2018-08-23 DIAGNOSIS — I340 Nonrheumatic mitral (valve) insufficiency: Secondary | ICD-10-CM | POA: Diagnosis not present

## 2018-08-23 DIAGNOSIS — I517 Cardiomegaly: Secondary | ICD-10-CM | POA: Diagnosis not present

## 2018-08-23 LAB — CBC
HEMATOCRIT: 37.4 % — AB (ref 40.0–52.0)
HEMOGLOBIN: 12.6 g/dL — AB (ref 13.0–18.0)
MCH: 33.3 pg (ref 26.0–34.0)
MCHC: 33.7 g/dL (ref 32.0–36.0)
MCV: 98.8 fL (ref 80.0–100.0)
Platelets: 187 10*3/uL (ref 150–440)
RBC: 3.79 MIL/uL — ABNORMAL LOW (ref 4.40–5.90)
RDW: 13 % (ref 11.5–14.5)
WBC: 5.2 10*3/uL (ref 3.8–10.6)

## 2018-08-23 LAB — BASIC METABOLIC PANEL
Anion gap: 7 (ref 5–15)
BUN: 20 mg/dL (ref 8–23)
CHLORIDE: 103 mmol/L (ref 98–111)
CO2: 24 mmol/L (ref 22–32)
Calcium: 8.9 mg/dL (ref 8.9–10.3)
Creatinine, Ser: 0.8 mg/dL (ref 0.61–1.24)
GFR calc Af Amer: >60 mL/min (ref >60)
Glucose, Bld: 169 mg/dL — ABNORMAL HIGH (ref 70–99)
POTASSIUM: 4.7 mmol/L (ref 3.5–5.1)
Sodium: 134 mmol/L — ABNORMAL LOW (ref 135–145)

## 2018-08-23 LAB — ECHOCARDIOGRAM COMPLETE
HEIGHTINCHES: 69 in
Weight: 3012.37 oz

## 2018-08-23 NOTE — Care Management Obs Status (Signed)
[facility] NOTIFICATION   Patient Details  Name: HAIG GERARDO MRN: [id number] Date of Birth: [birth year]   Medicare Observation Status Notification Given:  Yes    Shelbie Hutching, RN 08/23/2018, 10:16 AM

## 2018-08-23 NOTE — Care Management Note (Addendum)
Case Management Note  Patient Details  Name: SAED HUDLOW MRN: [id number] Date of Birth: Aug 02, 1927  Subjective/Objective:          Patient under observation care for dizziness.  Patient lives at [place] with wife, Gae Bon.  Both the patient and his wife are completely independent in ADL's.  Patient has never needed any assistive devices in the past but does state that he has become more unsteady and needs furniture to balance on at times in the home.  He will speak with PT during their evaluation to see if they recommend any assistive devices such as a cane or walker.  Patient has never required [facility] services or SNF, states he has always been pretty healthy.  Wife and he both drive but [facility] can provide transportation if needed for a fee.  PCP verified as Dr. Maryland Pink, not sure when his last visit was but has an appointment in December for annual wellness visit.  Pharmacy is [facility] and there are no barriers to obtaining prescriptions.  PT to evaluate.  Possible needs for DME equipment.  Will continue to follow.              Action/Plan:   Expected Discharge Date:  08/23/18               Expected Discharge Plan:  Home/Self Care  In-House Referral:     Discharge planning Services  CM Consult  Post Acute Care Choice:    Choice offered to:     DME Arranged:    DME Agency:     HH Arranged:    HH Agency:     Status of Service:  In process, will continue to follow  If discussed at Long Length of Stay Meetings, dates discussed:    Additional Comments:  Shelbie Hutching, RN 08/23/2018, 10:16 AM

## 2018-08-23 NOTE — Evaluation (Addendum)
Physical Therapy Evaluation Patient Details Name: Stuart Smith MRN: [id number] DOB: Apr 03, 1927 Today's Date: 08/23/2018   History of Present Illness   Pt is a 84 y/o M who presented with unsteadiness and ataxia with mobility OOB.  MRI negative for acute infarcts.  Pt wears Bil hearing aids.  Pt's PMH includes prostate cancer, torticollis, back surgery.  Additional history: Pt felt dizzy at church yesterday when sitting down which worsened with standing and required assist to walk to his car.  Pt reports that 3 days prior to this he felt dizzy when lowering his head in the shower.  Pt reports that ~1 year ago he had an episode of "haziness" during church, "then everything turned gray" and pt could not stand up.  This cleared on it's own.  Pt reports no vision changes, no light vs. dark preference, no falls in the past 6 months, no upper respiratory infection in the past 6 months, no recent antibiotic use, no head trauma or MVA.   With current episode his dizziness lasted all day on day of onset with it improving overnight.  Pt reports feeling dizzy when sitting EOB with RN earlier on the day of PT eval.    27 year h/o torticollis of unknown origin and receives botox injections for this.  Since admission pt has had both meclizine and prednisone.     Clinical Impression  Pt admitted with above diagnosis. Pt currently with functional limitations due to the deficits listed below (see PT Problem List). Mr. Gidney R Marye Round test was positive and thus was treated with Eply's Maneuver.  Pt reports decrease in symptoms following this procedure.  If symptoms continue the pt will need to follow up with OPPT for vestibular therapy.  Pt was then able to ambulate without AD with only mild balance deficits noted with higher level balance activity (horizontal head turns when ambulating).  Pt's mobility status appears to be close to his baseline.  Pt will benefit from skilled PT to increase their independence and  safety with mobility to allow discharge to the venue listed below.      Follow Up Recommendations Outpatient PT(for vestibular therapy if symptoms continue)    Equipment Recommendations  None recommended by PT    Recommendations for Other Services       Precautions / Restrictions Precautions Precautions: Fall;Other (comment) Precaution Comments: vertigo Restrictions Weight Bearing Restrictions: No      Mobility  Bed Mobility Overal bed mobility: Modified Independent             General bed mobility comments: No assist or cues needed for supine<>sit.   Transfers Overall transfer level: Needs assistance Equipment used: None Transfers: Sit to/from Stand Sit to Stand: Min guard         General transfer comment: Min guard for safety as pt hesitant with initial sit>stand from bed. Pt denies dizziness with this transition.   Ambulation/Gait Ambulation/Gait assistance: Min guard Gait Distance (Feet): 250 Feet Assistive device: None Gait Pattern/deviations: Step-through pattern;Decreased stride length     General Gait Details: Pt initially with guarded posture with min guard provided for safety.  Pt denies dizziness throughout entirety of ambulation.  Pt transitions to supervision as he continues ambulating.    Stairs            Wheelchair Mobility    Modified Rankin (Stroke Patients Only)       Balance Overall balance assessment: Modified Independent  Standardized Balance Assessment Standardized Balance Assessment : Dynamic Gait Index   Dynamic Gait Index Level Surface: Normal Change in Gait Speed: Normal Gait with Horizontal Head Turns: Mild Impairment Gait with Vertical Head Turns: Normal Gait and Pivot Turn: Normal Step Over Obstacle: Normal Step Around Obstacles: Normal       Pertinent Vitals/Pain Pain Assessment: No/denies pain    Home Living Family/patient expects to be discharged to::  Private residence Living Arrangements: Spouse/significant other   Type of Home: Independent living facility Home Access: Level entry     Home Layout: One level Home Equipment: Shower seat - built in;Grab bars - tub/shower      Prior Function Level of Independence: Independent         Comments: Pt ambulates without AD and denies any falls in the past 6 months.  Pt golfs 1x/wk and still drives.  Ind with ADLs and IADLs.      Hand Dominance        Extremity/Trunk Assessment   Upper Extremity Assessment Upper Extremity Assessment: Overall WFL for tasks assessed    Lower Extremity Assessment Lower Extremity Assessment: Overall WFL for tasks assessed    Cervical / Trunk Assessment Cervical / Trunk Assessment: Other exceptions Cervical / Trunk Exceptions: 27 year h/o torticollis of unknown origin  Communication   Communication: No difficulties  Cognition Arousal/Alertness: Awake/alert Behavior During Therapy: WFL for tasks assessed/performed Overall Cognitive Status: Within Functional Limits for tasks assessed                                        General Comments General comments (skin integrity, edema, etc.): Lying supine and turning head to the R makes him feel like it's "changing my equilibrium". Mild symptoms turning head to L, no issues with cervical extension. Pt does not follow PT's finger with vision testing in in LUQ, LLQ, and RLQ despite verbal cues. Pt with positive R Marye Round with reports of dizziness (4-5/10) but no nystagmus noted.  Pt reports 3/10 dizziness with L Marye Round with no nystagmus.  Pt treated with Eply's Maneuver to treat positive R [employer].   Orthostatics taken prior to PT session by RN: 155/81 supine, 173/100 sitting, 191/89 standing.     Exercises Other Exercises Other Exercises: Encouraged pt to ambulate in hallway at least 3x/day with nursing staff   Assessment/Plan    PT Assessment Patient needs continued PT  services(if symptoms continue)  PT Problem List Decreased balance       PT Treatment Interventions DME instruction;Gait training;Functional mobility training;Therapeutic activities;Therapeutic exercise;Balance training;Neuromuscular re-education;Patient/family education    PT Goals (Current goals can be found in the Care Plan section)  Acute Rehab PT Goals Patient Stated Goal: to return to PLOF, to return to golfing PT Goal Formulation: With patient Time For Goal Achievement: 09/06/18 Potential to Achieve Goals: Good    Frequency Min 2X/week   Barriers to discharge        Co-evaluation               AM-PAC PT "6 Clicks" Daily Activity  Outcome Measure Difficulty turning over in bed (including adjusting bedclothes, sheets and blankets)?: None Difficulty moving from lying on back to sitting on the side of the bed? : None Difficulty sitting down on and standing up from a chair with arms (e.g., wheelchair, bedside commode, etc,.)?: A Little Help needed moving to and from a  bed to chair (including a wheelchair)?: A Little Help needed walking in hospital room?: A Little Help needed climbing 3-5 steps with a railing? : A Little 6 Click Score: 20    End of Session Equipment Utilized During Treatment: Gait belt Activity Tolerance: Patient tolerated treatment well Patient left: in bed;with call bell/phone within reach;with bed alarm set;with family/visitor present;Other (comment)(with tech in room for echo) Nurse Communication: Mobility status;Other (comment)(pt treated for R posterior canal BPPV) PT Visit Diagnosis: Unsteadiness on feet (R26.81)    Time: 3664-4034 PT Time Calculation (min) (ACUTE ONLY): 49 min   Charges:   PT Evaluation $PT Eval Moderate Complexity: 1 Mod PT Treatments $Gait Training: 8-22 mins $Therapeutic Activity: 8-22 mins $Canalith Rep Proc: 8-22 mins        Collie Siad PT, DPT 08/23/2018, 2:02 PM

## 2018-08-23 NOTE — Discharge Summary (Signed)
[facility] [place] NAME: Stuart Smith    MR#:  [id number]  DATE OF BIRTH:  Oct 01, 1927  DATE OF ADMISSION:  08/22/2018 ADMITTING PHYSICIAN: Gladstone Lighter, MD  DATE OF DISCHARGE: 08/23/2018  2:24 PM  PRIMARY CARE PHYSICIAN: Maryland Pink, MD    ADMISSION DIAGNOSIS:  Dizziness [R42] Ataxia [R27.0]  DISCHARGE DIAGNOSIS:  Active Problems:   Dizziness   SECONDARY DIAGNOSIS:   Past Medical History:  Diagnosis Date  . Cataracts, bilateral   . Depression   . Elevated PSA   . H/O seasonal allergies   . History of chickenpox   . Hypertension   . Prostate cancer [facility]) 2013   Surgery   . Torticollis     HOSPITAL COURSE:   84 year old male with past medical history of hypertension, depression, history of prostate cancer, cataracts who presented to the hospital due to dizziness.  1.  Ataxia and unsteadiness- significantly positional -This was secondary to vestibular neuritis.  Had CT head, MRI of the brain which showed no acute intracranial pathology. - Patient was treated supportively with some meclizine as needed and oral prednisone and has improved. - He was ambulated with the help of physical therapy and was no longer unsteady or ataxic.  He will have outpatient physical therapy with vestibular therapy.   -Patient's carotid duplex also showed no hemodynamically significant carotid artery stenosis.  2.  Depression anxiety-continue home medications.  Patient will cont. on Zoloft, trazodone and Klonopin  3.  Hypertension- pt. will cont. lisinopril.  4.  History of prostate cancer-follows up outpatient.  pt. Will Continue Lupron every 6 months  Patient is being discharged home with outpatient physical therapy and vestibular therapy services.   DISCHARGE CONDITIONS:   Stable.   CONSULTS OBTAINED:    DRUG ALLERGIES:   Allergies  Allergen Reactions  . Keflex [Cephalexin] Rash    DISCHARGE MEDICATIONS:   Allergies  as of 08/23/2018      Reactions   Keflex [cephalexin] Rash      Medication List    TAKE these medications   aspirin 81 MG tablet Take 81 mg by mouth daily.   cetirizine 10 MG tablet Commonly known as:  ZYRTEC Take 10 mg by mouth daily.   clonazePAM 0.5 MG tablet Commonly known as:  KLONOPIN Take 0.5 mg by mouth 3 (three) times daily.   Flaxseed Oil Oil Take by mouth.   lisinopril 10 MG tablet Commonly known as:  PRINIVIL,ZESTRIL Take 10 mg by mouth daily.   LUPRON DEPOT (36-MONTH) 45 MG injection Generic drug:  Leuprolide Acetate (6 Month) Inject 45 mg into the muscle every 6 (six) months.   magnesium hydroxide 400 MG/5ML suspension Commonly known as:  MILK OF MAGNESIA Take 30 mLs by mouth daily as needed.   multivitamin capsule Take 1 capsule by mouth daily.   sertraline 25 MG tablet Commonly known as:  ZOLOFT Take 25 mg by mouth at bedtime.   traZODone 50 MG tablet Commonly known as:  DESYREL Take 50 mg by mouth at bedtime.   vitamin C 500 MG tablet Commonly known as:  ASCORBIC ACID Take 500 mg by mouth daily.         DISCHARGE INSTRUCTIONS:   DIET:  Cardiac diet  DISCHARGE CONDITION:  Stable  ACTIVITY:  Activity as tolerated  OXYGEN:  Home Oxygen: No.   Oxygen Delivery: room air  DISCHARGE LOCATION:  home   If you experience worsening of your admission symptoms, develop shortness  of breath, life threatening emergency, suicidal or homicidal thoughts you must seek medical attention immediately by calling 911 or calling your MD immediately  if symptoms less severe.  You Must read complete instructions/literature along with all the possible adverse reactions/side effects for all the Medicines you take and that have been prescribed to you. Take any new Medicines after you have completely understood and accpet all the possible adverse reactions/side effects.   Please note  You were cared for by a hospitalist during your hospital stay. If you  have any questions about your discharge medications or the care you received while you were in the hospital after you are discharged, you can call the unit and asked to speak with the hospitalist on call if the hospitalist that took care of you is not available. Once you are discharged, your primary care physician will handle any further medical issues. Please note that NO REFILLS for any discharge medications will be authorized once you are discharged, as it is imperative that you return to your primary care physician (or establish a relationship with a primary care physician if you do not have one) for your aftercare needs so that they can reassess your need for medications and monitor your lab values.     Today    VITAL SIGNS:  Blood pressure (!) 153/95, pulse 92, temperature 98.4 F (36.9 C), temperature source Oral, resp. rate 18, height 5\' 9"  (1.753 m), weight 85.4 kg, SpO2 93 %.  I/O:    Intake/Output Summary (Last 24 hours) at 08/23/2018 1534 Last data filed at 08/23/2018 0844 Gross per 24 hour  Intake 480 ml  Output 875 ml  Net -395 ml    PHYSICAL EXAMINATION:  GENERAL:  84 y.o.-year-old patient lying in the bed with no acute distress.  EYES: Pupils equal, round, reactive to light and accommodation. No scleral icterus. Extraocular muscles intact.  HEENT: Head atraumatic, normocephalic. Oropharynx and nasopharynx clear.  NECK:  Supple, no jugular venous distention. No thyroid enlargement, no tenderness.  LUNGS: Normal breath sounds bilaterally, no wheezing, rales,rhonchi. No use of accessory muscles of respiration.  CARDIOVASCULAR: S1, S2 normal. No murmurs, rubs, or gallops.  ABDOMEN: Soft, non-tender, non-distended. Bowel sounds present. No organomegaly or mass.  EXTREMITIES: No pedal edema, cyanosis, or clubbing.  NEUROLOGIC: Cranial nerves II through XII are intact. No focal motor or sensory defecits b/l.  PSYCHIATRIC: The patient is alert and oriented x 3. Good affect.   SKIN: No obvious rash, lesion, or ulcer.   DATA REVIEW:   CBC Recent Labs  Lab 08/23/18 0355  WBC 5.2  HGB 12.6*  HCT 37.4*  PLT 187    Chemistries  Recent Labs  Lab 08/22/18 1114 08/23/18 0355  NA 134* 134*  K 4.8 4.7  CL 100 103  CO2 22 24  GLUCOSE 114* 169*  BUN 19 20  CREATININE 0.68 0.80  CALCIUM 9.6 8.9  AST 20  --   ALT 17  --   ALKPHOS 72  --   BILITOT 1.0  --     Cardiac Enzymes Recent Labs  Lab 08/22/18 1114  [place] <0.03    Microbiology Results  No results found for this or any previous visit.  RADIOLOGY:  Mr Brain 53 Contrast  Result Date: 08/22/2018 CLINICAL DATA:  84 year old male with sudden onset dizziness. Code stroke. EXAM: MRI HEAD WITHOUT CONTRAST TECHNIQUE: Multiplanar, multiecho pulse sequences of the brain and surrounding structures were obtained without intravenous contrast. COMPARISON:  Head CT without contrast 1110  hours today. FINDINGS: Brain: No restricted diffusion to suggest acute infarction. No midline shift, mass effect, evidence of mass lesion, ventriculomegaly, extra-axial collection or acute intracranial hemorrhage. Cervicomedullary junction and pituitary are within normal limits. Pearline Cables and white matter signal is within normal limits for age throughout the brain. No encephalomalacia or chronic cerebral blood products identified. Vascular: Major intracranial vascular flow voids are preserved. Skull and upper cervical spine: Bulky degenerative ligamentous hypertrophy about the odontoid effaces the ventral CSF space but there is no significant cervicomedullary junction stenosis (series 11, image 13). Partial erosion of the odontoid. Superimposed incomplete segmentation of the C2-C3 cervical levels. Background bone marrow signal is normal. Sinuses/Orbits: Postoperative changes to both globes. Paranasal Visualized paranasal sinuses and mastoids are stable and well pneumatized. Other: Visible internal auditory structures appear grossly  normal. Normal stylomastoid foramina. Scalp and face soft tissues appear negative. IMPRESSION: 1. Normal for age noncontrast MRI appearance of the brain. 2. Bulky ligamentous hypertrophy about the dens with evidence of some bony erosion raises the possibility of underlying rheumatoid arthropathy. Alternatively, this could be advanced osteoarthritis as the C2-C3 spinal segments are congenitally fused. Electronically Signed   By: Genevie Ann M.D.   On: 08/22/2018 13:30   US Carotid Bilateral  Result Date: 08/22/2018 CLINICAL DATA:  Ataxia EXAM: BILATERAL CAROTID DUPLEX ULTRASOUND TECHNIQUE: Pearline Cables scale imaging, color Doppler and duplex ultrasound were performed of bilateral carotid and vertebral arteries in the neck. COMPARISON:  None. FINDINGS: Criteria: Quantification of carotid stenosis is based on velocity parameters that correlate the residual internal carotid diameter with NASCET-based stenosis levels, using the diameter of the distal internal carotid lumen as the denominator for stenosis measurement. The following velocity measurements were obtained: RIGHT ICA: Peak systolic velocity 47 cm/sec and end-diastolic velocity 14 cm/sec CCA: Peak systolic velocity 48 cm/sec and end-diastolic velocity 9 cm/sec SYSTOLIC ICA/CCA RATIO:  1 ECA: 92 cm/sec LEFT ICA: Peak systolic velocity 72 cm/sec and end-diastolic velocity 19 cm/sec CCA: Peak systolic velocity 60 cm/sec and end-diastolic velocity 8 cm/sec SYSTOLIC ICA/CCA RATIO:  1.2 ECA: Peak systolic velocity 83 cm/sec RIGHT CAROTID ARTERY: Mild scattered soft and hard plaque at the carotid bulb and proximal ICA RIGHT VERTEBRAL ARTERY:  Antegrade LEFT CAROTID ARTERY: Mild soft and hard plaque in the carotid bulb and proximal ICA LEFT VERTEBRAL ARTERY:  Antegrade Upper extremity blood pressures: RIGHT: NA LEFT: NA IMPRESSION: Less than 50% stenosis of the internal carotid arteries bilaterally. Antegrade flow in both vertebral arteries. Electronically Signed   By: Ashley Royalty M.D.   On: 08/22/2018 15:54   Ct Head Code Stroke Wo Contrast  Addendum Date: 08/22/2018   ADDENDUM REPORT: 08/22/2018 11:38 ADDENDUM: Study discussed by telephone with Dr. Rudene Re on 08/22/2018 at 1135 hours. Electronically Signed   By: Genevie Ann M.D.   On: 08/22/2018 11:38   Result Date: 08/22/2018 CLINICAL DATA:  Code stroke. 84 year old male with sudden onset dizziness. EXAM: CT HEAD WITHOUT CONTRAST TECHNIQUE: Contiguous axial images were obtained from the base of the skull through the vertex without intravenous contrast. COMPARISON:  None. FINDINGS: Brain: Cerebral volume is within normal limits for age. No midline shift, ventriculomegaly, mass effect, evidence of mass lesion, intracranial hemorrhage or evidence of cortically based acute infarction. Gray-white matter differentiation is normal for age throughout the brain. No cortical encephalomalacia identified. Vascular: Calcified atherosclerosis at the skull base. No suspicious intracranial vascular hyperdensity. Skull: Negative. Sinuses/Orbits: Visualized paranasal sinuses and mastoids are well pneumatized. Other: Postoperative changes to both globes. Negative scalp  soft tissues. ASPECTS [facility] Stroke Program Early CT Score) - Ganglionic level infarction (caudate, lentiform nuclei, internal capsule, insula, M1-M3 cortex): 7 - Supraganglionic infarction (M4-M6 cortex): 3 Total score (0-10 with 10 being normal): 10 IMPRESSION: 1. Normal for age non contrast CT appearance of the brain. 2. ASPECTS is 10. Electronically Signed: By: Genevie Ann M.D. On: 08/22/2018 11:18      Management plans discussed with the patient, family and they are in agreement.  CODE STATUS:     Code Status Orders  (From admission, onward)         Start     Ordered   08/22/18 1555  Full code  Continuous     08/22/18 1554        Code Status History    This patient has a current code status but no historical code status.    Advance Directive  Documentation     Most Recent Value  Type of Advance Directive  Living will, Healthcare Power of Attorney  Pre-existing out of facility DNR order (yellow form or pink MOST form)  -  "MOST" Form in Place?  -      TOTAL TIME TAKING CARE OF THIS PATIENT: 40 minutes.    Henreitta Leber M.D on 08/23/2018 at 3:34 PM  Between 7am to 6pm - Pager - [phone]  After 6pm go to [email or web address] - Proofreader  [facility]  Office  [phone]  CC: Primary care physician; Maryland Pink, MD

## 2018-08-23 NOTE — Plan of Care (Signed)
  Problem: Education: Goal: Knowledge of secondary prevention will improve Outcome: Progressing   Problem: Education: Goal: Knowledge of patient specific risk factors addressed and post discharge goals established will improve Outcome: Progressing   Problem: Ischemic Stroke/TIA Tissue Perfusion: Goal: Complications of ischemic stroke/TIA will be minimized Outcome: Progressing

## 2018-08-23 NOTE — Progress Notes (Signed)
Pt is being discharged back to [facility] independent living.  Discharge papers given and explained to pt.  Pt verbalized understanding.  Meds and f/u appointment reviewed.  No Rx at this time.

## 2018-08-23 NOTE — Progress Notes (Signed)
*  PRELIMINARY RESULTS* Echocardiogram 2D Echocardiogram has been performed.  Sherrie Sport 08/23/2018, 11:30 AM

## 2018-08-23 NOTE — Care Management (Signed)
Physical therapy is recommending outpatient vesticular services. Spoke with Mr & Ms Sadowsky at the bedside. Agreed to have outpatient services at [facility]. Will get Dr. Verdell Carmine to sign referral and faxed to Rehab. [place] RN MSN CCM Care Management [phone]

## 2018-09-01 ENCOUNTER — Ambulatory Visit: Payer: Medicare Other | Attending: Family Medicine

## 2018-09-01 DIAGNOSIS — M6281 Muscle weakness (generalized): Secondary | ICD-10-CM | POA: Diagnosis present

## 2018-09-01 DIAGNOSIS — R2681 Unsteadiness on feet: Secondary | ICD-10-CM

## 2018-09-01 NOTE — Therapy (Signed)
[place] MAIN [facility] SERVICES [address] [place], [place], [address] Phone: [phone]   Fax:  [phone]  Physical Therapy Evaluation  Patient Details  Name: Stuart Smith MRN: [id number] Date of Birth: [birth year] Referring Provider (PT): Dr. Kary Kos   Encounter Date: 09/01/2018  PT End of Session - 09/01/18 2054    Visit Number  1    Number of Visits  13    Date for PT Re-Evaluation  10/13/18    Authorization Type  Progress note 1/10, Last goals: 09/01/18    PT Start Time  1115    PT Stop Time  1200    PT Time Calculation (min)  45 min    Equipment Utilized During Treatment  Gait belt    Activity Tolerance  Patient tolerated treatment well    Behavior During Therapy  [facility] for tasks assessed/performed       Past Medical History:  Diagnosis Date  . Cataracts, bilateral   . Depression   . Elevated PSA   . H/O seasonal allergies   . History of chickenpox   . Hypertension   . Prostate cancer [facility]) 2013   Surgery   . Torticollis     Past Surgical History:  Procedure Laterality Date  . BACK SURGERY  1995  . GALLBLADDER SURGERY    . PROSTATE SURGERY  2013    There were no vitals filed for this visit.   Subjective Assessment - 09/01/18 2049    Subjective  Dizziness/unsteadiness    Patient is accompained by:  Family member   Wife who remained in the waiting room   Pertinent History  Pt reports that he was washing his head in the sink on 08/19/18 and had a severe bout of dizziness. He reports feeling "totally disoriented." Unclear if he had any vertigo. He states that after he brought his head back up the dizziness resolved. Then the following Sunday he went to stand up during communion at church and got very dizzy. He came to the [facility] ER and was worked up for a CVA. MRI negative for acute changes and carotid ultrasound showed less than 50% stenosis of the internal carotid arteries bilaterally. Pt was kept overnight for  observation and his dizziness persisted into the morning. Pt states that he was seen by physical therapist at the hospital who "threw me around" and it "helped a little bit." Pt was also treated with oral prednisone and meclizine and his symptoms improved. Pt reports that his symptoms are mostly resolved since that time with the exception of some unsteadiness. Pt denies any head trauma or MVA. PMH includes prostate cancer x 6 years for which he has been treated and has resulted in some weakness. Next Friday he will be having botox injections for his chronic torticollis. Prior history of low back surgery. Pt reports that ~1 year ago he had an episode of "haziness" during church, "then everything turned gray" and pt could not stand up. That cleared on it's own after going home and resting. Pt reports some "thickness" feeling on the underside of his feet.     Limitations  Walking    Diagnostic tests  MRI negative for acute changes and carotid ultrasound showed less than 50% stenosis of the internal carotid arteries bilaterally.    Patient Stated Goals  Improve balance    Currently in Pain?  No/denies         [facility] PT Assessment - 09/01/18 1143  Assessment   Medical Diagnosis  Vertigo    Referring Provider (PT)  Dr. Kary Kos    Onset Date/Surgical Date  08/19/18    Hand Dominance  Right    Next MD Visit  09/02/18    Prior Therapy  Yes for strength and balance      Precautions   Precautions  Fall      Restrictions   Weight Bearing Restrictions  No      Balance Screen   Has the patient fallen in the past 6 months  No    Has the patient had a decrease in activity level because of a fear of falling?   No    Is the patient reluctant to leave their home because of a fear of falling?   No      Home Film/video editor residence    Living Arrangements  Spouse/significant other    Available Help at Discharge  Family    Type of [place] Access  Level entry     [place]  One level      Prior Function   Level of [place]  Retired    Leisure  Radio producer   Overall Cognitive Status  Within Functional Limits for tasks assessed      Observation/Other Assessments   Other Surveys   Other Surveys    Activities of Balance Confidence Scale (ABC Scale)   Pt didn't complete    Dizziness Handicap Inventory ([place])   Pt didn't complete      Standardized Balance Assessment   Standardized Balance Assessment  Dynamic Gait Index;10 meter walk test;Five Times Sit to Stand    Five times sit to stand comments   Unable to stand without UE support    10 Meter Walk  Self-selected: 14.4s = 0.69 m/s, Fastest: 12.7s = 0.79 m/sec      Dynamic Gait Index   Level Surface  Mild Impairment    Change in Gait Speed  Mild Impairment    Gait with Horizontal Head Turns  Mild Impairment    Gait with Vertical Head Turns  Mild Impairment    Gait and Pivot Turn  Mild Impairment    Step Over Obstacle  Moderate Impairment    Step Around Obstacles  Normal    Steps  Mild Impairment    Total Score  16           Objective measurements completed on examination: See above findings.         VESTIBULAR AND BALANCE EVALUATION   HISTORY:  Subjective history of current problem: Pt reports that he was washing his head in the sink on 08/19/18 and had a severe bout of dizziness. He reports feeling "totally disoriented." Unclear if he had any vertigo. He states that after he brought his head back up the dizziness resolved. Then the following Sunday he went to stand up during communion at church and got very dizzy. He came to the [facility] ER and was worked up for a CVA. MRI negative for acute changes and carotid ultrasound showed less than 50% stenosis of the internal carotid arteries bilaterally. Pt was kept overnight for observation and his dizziness persisted into the morning. Pt states that he was seen by physical therapist at the hospital  who "threw me around" and it "helped a little bit." Pt was also treated with oral prednisone and  meclizine and his symptoms improved. Pt reports that his symptoms are mostly resolved since that time with the exception of some unsteadiness. Pt denies any head trauma or MVA. PMH includes prostate cancer x 6 years for which he has been treated and has resulted in some weakness. Next Friday he will be having botox injections for his chronic torticollis. Prior history of low back surgery. Pt reports that ~1 year ago he had an episode of "haziness" during church, "then everything turned gray" and pt could not stand up. That cleared on it's own after going home and resting. Pt reports some "thickness" feeling on the underside of his feet.    Description of dizziness: (vertigo, unsteadiness, lightheadedness, falling, general unsteadiness, whoozy, swimmy-headed sensation, aural fullness) "I just didn't feel right" Frequency: Mostly resolved at this time. Two acute episodes. Duration: No further dizziness but at the time but when it occurred the symptoms lasted hours Symptom nature: (motion provoked, positional, spontaneous, constant, variable, intermittent)  Present while resting but worsened with movement during episode  Provocative Factors: movement Easing Factors: medication  Progression of symptoms: (better, worse, no change since onset) Better History of similar episodes: One somewhat similar episode approximately 1 year ago  Falls (yes/no): No Number of falls in past 6 months: No   Prior Functional Level: Independent  Auditory complaints (tinnitus, pain, drainage): None Vision (last eye exam, diplopia, recent changes): "Things didn't look right" but pt reports he was able to focus during episode. No diplopia. Wears corrective lenses for driving. Yearly vision exams.   Current Symptoms: (dysarthria, dysphagia, drop attacks, bowel and bladder changes, recent weight loss/gain)    Review of systems  negative for red flags.     EXAMINATION  POSTURE: Forward head and pulled to the right slightly from chronic torticollis  NEUROLOGICAL SCREEN: (2+ unless otherwise noted.) N=normal  Ab=abnormal  Level Dermatome R L Myotome R L Reflex R L  C3 Anterior Neck N N Sidebend C2-3 N N Jaw CN V    C4 Top of Shoulder N N Shoulder Shrug C4 N N Hoffman's UMN    C5 Lateral Upper Arm N N Shoulder ABD C4-5 N N Biceps C5-6    C6 Lateral Arm/ Thumb N N Arm Flex/ Wrist Ext C5-6 N N Brachiorad. C5-6    C7 Middle Finger N N Arm Ext//Wrist Flex C6-7 N N Triceps C7    C8 4th & 5th Finger N N Flex/ Ext Carpi Ulnaris C8 N N Patella    T1 Medial Arm N N Interossei T1 N N Gastrocnemius    L2 Medial thigh/groin N N Illiopsoas (L2-3) N N     L3 Lower thigh/med.knee N N Quadriceps (L3-4) N N Patellar (L3-4)    L4 Medial leg/lat thigh N N Tibialis Ant (L4-5) N N     L5 Lat. leg & dorsal foot N N EHL (L5) N N     S1 post/lat foot/thigh/leg N N Gastrocnemius (S1-2) N N Gastrocnemius (S1)    S2 Post./med. thigh & leg N N Hamstrings (L4-S3) N N Babinski      SOMATOSENSORY:         Sensation           Intact      Diminished         Absent  Light touch Normal     Any N & T in extremities or weakness: Pt reports some "thickness" feeling on the underside of his feet      COORDINATION: Finger  to Nose: Normal Pronator Drift: Mild right pronator drift   MUSCULOSKELETAL SCREEN: Cervical Spine ROM: Severely limited extension. Moderate loss of lateral flexion and rotation   MMT: Grossly WFL bilateral without focal weakness   Functional Mobility: Independent for all transfers and ambulation  Gait: Patient arrives ambulating without AD.  Scanning of visual environment with gait is: Fair   OCULOMOTOR / VESTIBULAR TESTING:  Oculomotor Exam- Room Light  Normal Abnormal Comments  Ocular Alignment N    Ocular ROM N    Spontaneous Nystagmus N    End-Gaze Nystagmus N    Smooth Pursuit  A Saccadic  Saccades N     VOR N    VOR Cancellation  A Saccades noted but not reported dizziness  Left Head Thrust N  Difficult to perform bilaterally due to patient struggling to relax and torticollis pulling neck into flexion  Right Head Thrust N    Head Shaking Nystagmus     Static Acuity     Dynamic Acuity       Oculomotor Exam- Fixation Suppressed: Deferred  BPPV TESTS:  Symptoms Duration Intensity Nystagmus  L Dix-Hallpike None   None observed  R Dix-Hallpike None   None observed  L Head Roll      R Head Roll        FUNCTIONAL OUTCOME MEASURES:  Results Comments  DHI Pt didn't complete   ABC Scale Pt didn't complete   DGI 16/24 Fall risk, in need of intervention  10 meter Walking Speed Self-selected: 14.4s = 0.69 m/s, Fastest: 12.7s = 0.79 m/sec average as compared to age and gender normative values       PT Short Term Goals - 09/01/18 2109      PT SHORT TERM GOAL #1   Title  Pt will be independent with HEP in order to improve strength and balance in order to decrease fall risk and improve function at home and work.     Time  3    Period  Weeks    Status  New    Target Date  09/22/18                PT Long Term Goals - 09/01/18 2109      PT LONG TERM GOAL #1   Title  Pt will improve DGI by at least 3 points in order to demonstrate clinically significant improvement in balance and decreased risk for falls.    Baseline  09/01/18: 16/24    Time  6    Period  Weeks    Status  New    Target Date  10/13/18      PT LONG TERM GOAL #2   Title  Pt will be able to perform sit to stand without UE support in order to improve functional independence with transfers    Baseline  09/01/18: unable to perform    Time  6    Period  Weeks    Status  New    Target Date  10/13/18      PT LONG TERM GOAL #3   Title  Pt will increase 10MWT by at least 0.13 m/s in order to demonstrate clinically significant improvement in community ambulation.    Baseline  09/01/18: welf-selected: 14.4s = 0.69  m/s, Fastest: 12.7s = 0.79 m/sec    Time  6    Period  Weeks    Status  New    Target Date  10/13/18  Plan - 09/01/18 2055    Clinical Impression Statement  Pt is a pleasant 84 year-old male referred to physical therapy for vertigo. Pt was evaluated at [facility] to rule out CVA. MRI negative for acute changes and carotid ultrasound showed less than 50% stenosis of the internal carotid arteries bilaterally. Pt was treated with meclizine and steroids and pt reports that his dizziness is now resolved but he continues to complain of imbalance. PT evaluation reveals gait speed below full community ambulation norms. Pt with bilateral LE weakness and is unable to perform sit to stand without UE assistance. Dix-Hallpike testing is negative today for posterior canal BPPV. Oculmotor testing reveals saccadic smooth pursuits and positive VOR cancellation both of which are central signs but neither reproduces patient's dizziness. No peripheral signs identified during oculomotor examination. However, pt with DGI of 16/24 with difficulty performing horizontal and vertical head turns. It is possible that his acute symptoms were related to a vestibular neuritis and were successfully treated with steroids. He has no further dizziness but does present with notable weakness and imbalance. Pt will benefit from PT services to address deficits in strength, balance, and mobility in order to return to full function at home and decrease his fall risk.      History and Personal Factors relevant to plan of care:  2 personal factors/comorbidities, 3 body systems/activity limitations/participation restrictions      Clinical Presentation  Unstable    Clinical Decision Making  Moderate    Rehab Potential  Good    Clinical Impairments Affecting Rehab Potential  (+) symptoms have resolved    PT Frequency  2x / week    PT Duration  6 weeks    PT Treatment/Interventions  ADLs/Self Care Home Management;Aquatic  Therapy;Canalith Repostioning;Cryotherapy;Electrical Stimulation;Iontophoresis 4mg /ml Dexamethasone;Moist Heat;Traction;Ultrasound;DME Instruction;Gait training;Stair training;Functional mobility training;Therapeutic activities;Therapeutic exercise;Balance training;Neuromuscular re-education;Cognitive remediation;Patient/family education;Manual techniques;Passive range of motion;Dry needling;Vestibular    PT Next Visit Plan  BERG, DVA, initiate HEP for balance and strengthening    PT Home Exercise Plan  None currently    Consulted and Agree with Plan of Care  Patient       Patient will benefit from skilled therapeutic intervention in order to improve the following deficits and impairments:  Abnormal gait, Decreased balance, Decreased strength, Difficulty walking  Visit Diagnosis: Unsteadiness on feet - Plan: PT plan of care cert/re-cert  Muscle weakness (generalized) - Plan: PT plan of care cert/re-cert     Problem List Patient Active Problem List   Diagnosis Date Noted  . Dizziness 08/22/2018  . Gait instability 10/27/2016  . Essential hypertension 07/25/2014  . Atherosclerosis of abdominal aorta ([place]) 05/19/2013  . Abnormal EKG 08/09/2012  . Prostate cancer ([place]) 08/09/2012   Phillips Grout PT, DPT, GCS  Nada Godley 09/01/2018, 9:16 PM  [place] MAIN [facility] SERVICES [address] [place], [place], [address] Phone: [phone]   Fax:  [phone]  Name: Stuart Smith MRN: [id number] Date of Birth: May 17, 1927

## 2018-09-08 ENCOUNTER — Ambulatory Visit: Payer: Medicare Other | Admitting: Physical Therapy

## 2018-09-14 ENCOUNTER — Ambulatory Visit: Payer: Medicare Other

## 2018-09-14 DIAGNOSIS — R2681 Unsteadiness on feet: Secondary | ICD-10-CM | POA: Diagnosis not present

## 2018-09-14 DIAGNOSIS — M6281 Muscle weakness (generalized): Secondary | ICD-10-CM

## 2018-09-14 NOTE — Patient Instructions (Signed)
Access Code: [email or web address]  URL: [email or web address]  Date: 09/14/2018  Prepared by: Roxana Hires   Exercises  Sit to Stand without Arm Support - 10 reps - 2x daily - 7x weekly  Standing with Head Rotation - 10 reps - 2x daily - 7x weekly

## 2018-09-14 NOTE — Therapy (Signed)
[place] MAIN [facility] SERVICES [address] [place], [place], [address] Phone: [phone]   Fax:  [phone]  Physical Therapy Treatment  Patient Details  Name: Stuart Smith MRN: [id number] Date of Birth: 06-Jun-1927 Referring Provider (PT): Dr. Kary Kos   Encounter Date: 09/14/2018  PT End of Session - 09/14/18 0933    Visit Number  2    Number of Visits  13    Date for PT Re-Evaluation  10/13/18    Authorization Type  Progress note 2/10, Last goals: 09/01/18    PT Start Time  0935    PT Stop Time  1015    PT Time Calculation (min)  40 min    Equipment Utilized During Treatment  Gait belt    Activity Tolerance  Patient tolerated treatment well    Behavior During Therapy  [facility] for tasks assessed/performed       Past Medical History:  Diagnosis Date  . Cataracts, bilateral   . Depression   . Elevated PSA   . H/O seasonal allergies   . History of chickenpox   . Hypertension   . Prostate cancer [facility]) 2013   Surgery   . Torticollis     Past Surgical History:  Procedure Laterality Date  . BACK SURGERY  1995  . GALLBLADDER SURGERY    . PROSTATE SURGERY  2013    There were no vitals filed for this visit.  Subjective Assessment - 09/14/18 0932    Subjective  Pt had his botox injection in his neck and around both eyes on Friday and he states that it will take about 10 days to start having an effect. He is having some soreness in his neck at this time.     Patient is accompained by:  Family member   Wife who remained in the waiting room   Pertinent History  Pt reports that he was washing his head in the sink on 08/19/18 and had a severe bout of dizziness. He reports feeling "totally disoriented." Unclear if he had any vertigo. He states that after he brought his head back up the dizziness resolved. Then the following Sunday he went to stand up during communion at church and got very dizzy. He came to the [facility] ER and was worked up for  a CVA. MRI negative for acute changes and carotid ultrasound showed less than 50% stenosis of the internal carotid arteries bilaterally. Pt was kept overnight for observation and his dizziness persisted into the morning. Pt states that he was seen by physical therapist at the hospital who "threw me around" and it "helped a little bit." Pt was also treated with oral prednisone and meclizine and his symptoms improved. Pt reports that his symptoms are mostly resolved since that time with the exception of some unsteadiness. Pt denies any head trauma or MVA. PMH includes prostate cancer x 6 years for which he has been treated and has resulted in some weakness. Next Friday he will be having botox injections for his chronic torticollis. Prior history of low back surgery. Pt reports that ~1 year ago he had an episode of "haziness" during church, "then everything turned gray" and pt could not stand up. That cleared on it's own after going home and resting. Pt reports some "thickness" feeling on the underside of his feet.     Limitations  Walking    Diagnostic tests  MRI negative for acute changes and carotid ultrasound showed less than 50% stenosis of the internal  carotid arteries bilaterally.    Patient Stated Goals  Improve balance    Currently in Pain?  --   Soreness in neck from injections but no pain        [facility] PT Assessment - 09/14/18 0941      Standardized Balance Assessment   Standardized Balance Assessment  Berg Balance Test      Berg Balance Test   Sit to Stand  Able to stand  independently using hands    Standing Unsupported  Able to stand safely 2 minutes    Sitting with Back Unsupported but Feet Supported on Floor or Stool  Able to sit safely and securely 2 minutes    Stand to Sit  Sits safely with minimal use of hands    Transfers  Able to transfer safely, definite need of hands    Standing Unsupported with Eyes Closed  Able to stand 10 seconds safely    Standing Ubsupported with Feet  Together  Able to place feet together independently and stand 1 minute safely    From Standing, Reach Forward with Outstretched Arm  Can reach confidently >25 cm (10")    From Standing Position, Pick up Object from Floor  Able to pick up shoe, needs supervision    From Standing Position, Turn to Look Behind Over each Shoulder  Looks behind from both sides and weight shifts well    Turn 360 Degrees  Able to turn 360 degrees safely but slowly    Standing Unsupported, Alternately Place Feet on Step/Stool  Able to complete 4 steps without aid or supervision    Standing Unsupported, One Foot in Front  Able to plae foot ahead of the other independently and hold 30 seconds    Standing on One Leg  Tries to lift leg/unable to hold 3 seconds but remains standing independently    Total Score  45         TREATMENT  Neuromuscular Re-education  Performed BERG (45/56) with patient. Pt unable to stand without UE support so 5TSTS deferred on this date. Deferred DVA on this date due to patient's report of difficulty focusing after Botox injections around eyes; Toe taps to 6" step without UE support alternating LE x 10 each; Feet together horziontal head turns x 60 seconds (added to HEP); Airex balance with toe taps to stepping stone alternating LE x 10 each; 6" Hurdle steps x 10 bilateral;   Ther-ex  NuStep L2 x 5 minutes for warm-up during history; Sit to stand without UE support from chair with Airex cushion on seat 2 x 10 (added to HEP); Heel/toe raises without UE support x 10 each; Quantum leg press 105# x 25, 120# x 25 (added to HEP);   Pt educated throughout session about proper posture and technique with exercises. Improved exercise technique, movement at target joints, use of target muscles after min to mod verbal, visual, tactile cues.    Initiated HEP for patient including sit to stand without UE support and feet apart/together balance with slow horizontal head turns. He demonstrates  instability when performing head turns today during session. Performed BERG with patient who scored 45/56. He is unable to perform 5TSTS due to inability to rise without UE support. Unable to attempt DVA test today due to difficulty focusing secondary to Botox injections last week. Will continue to progress HEP as appropriate in future sessions as well as advancing strengthening and balance exercises. Pt will benefit from PT services to address deficits in strength, balance, and  mobility in order to return to full function at home.                   PT Short Term Goals - 09/01/18 2109      PT SHORT TERM GOAL #1   Title  Pt will be independent with HEP in order to improve strength and balance in order to decrease fall risk and improve function at home and work.     Time  3    Period  Weeks    Status  New    Target Date  09/22/18        PT Long Term Goals - 09/01/18 2109      PT LONG TERM GOAL #1   Title  Pt will improve DGI by at least 3 points in order to demonstrate clinically significant improvement in balance and decreased risk for falls.    Baseline  09/01/18: 16/24    Time  6    Period  Weeks    Status  New    Target Date  10/13/18      PT LONG TERM GOAL #2   Title  Pt will be able to perform sit to stand without UE support in order to improve functional independence with transfers    Baseline  09/01/18: unable to perform    Time  6    Period  Weeks    Status  New    Target Date  10/13/18      PT LONG TERM GOAL #3   Title  Pt will increase 10MWT by at least 0.13 m/s in order to demonstrate clinically significant improvement in community ambulation.    Baseline  09/01/18: welf-selected: 14.4s = 0.69 m/s, Fastest: 12.7s = 0.79 m/sec    Time  6    Period  Weeks    Status  New    Target Date  10/13/18            Plan - 09/14/18 0936    Clinical Impression Statement  Initiated HEP for patient including sit to stand without UE support and feet  apart/together balance with slow horizontal head turns. He demonstrates instability when performing head turns today during session. Performed BERG with patient who scored 45/56. He is unable to perform 5TSTS due to inability to rise without UE support. Unable to attempt DVA test today due to difficulty focusing secondary to Botox injections last week. Will continue to progress HEP as appropriate in future sessions as well as advancing strengthening and balance exercises. Pt will benefit from PT services to address deficits in strength, balance, and mobility in order to return to full function at home.      Rehab Potential  Good    Clinical Impairments Affecting Rehab Potential  (+) symptoms have resolved    PT Frequency  2x / week    PT Duration  6 weeks    PT Treatment/Interventions  ADLs/Self Care Home Management;Aquatic Therapy;Canalith Repostioning;Cryotherapy;Electrical Stimulation;Iontophoresis 4mg /ml Dexamethasone;Moist Heat;Traction;Ultrasound;DME Instruction;Gait training;Stair training;Functional mobility training;Therapeutic activities;Therapeutic exercise;Balance training;Neuromuscular re-education;Cognitive remediation;Patient/family education;Manual techniques;Passive range of motion;Dry needling;Vestibular    PT Next Visit Plan  DVA if focus has improved. Review HEP and progress balance and strengthening, initiate HEP for balance and strengthening    PT Home Exercise Plan  None currently    Consulted and Agree with Plan of Care  Patient       Patient will benefit from skilled therapeutic intervention in order to improve the following deficits and impairments:  Abnormal  gait, Decreased balance, Decreased strength, Difficulty walking  Visit Diagnosis: Unsteadiness on feet  Muscle weakness (generalized)     Problem List Patient Active Problem List   Diagnosis Date Noted  . Dizziness 08/22/2018  . Gait instability 10/27/2016  . Essential hypertension 07/25/2014  .  Atherosclerosis of abdominal aorta ([place]) 05/19/2013  . Abnormal EKG 08/09/2012  . Prostate cancer ([place]) 08/09/2012   Phillips Grout PT, DPT, GCS  Vallorie Niccoli 09/14/2018, 10:41 AM  [place] MAIN [facility] SERVICES [address] [place], [place], [address] Phone: [phone]   Fax:  [phone]  Name: Stuart Smith MRN: [id number] Date of Birth: 12-24-1926

## 2018-09-16 ENCOUNTER — Ambulatory Visit: Payer: Medicare Other

## 2018-09-16 VITALS — BP 157/74 | HR 94

## 2018-09-16 DIAGNOSIS — R2681 Unsteadiness on feet: Secondary | ICD-10-CM | POA: Diagnosis not present

## 2018-09-16 DIAGNOSIS — M6281 Muscle weakness (generalized): Secondary | ICD-10-CM

## 2018-09-16 NOTE — Patient Instructions (Signed)
Access Code: [email or web address]  URL: [email or web address]  Date: 09/16/2018  Prepared by: Roxana Hires   Exercises  Heel Raise - 10 reps - 2 sets - 3 seconds hold - 1x daily - 7x weekly

## 2018-09-16 NOTE — Therapy (Signed)
[place] MAIN [facility] SERVICES [address] [place], [place], [address] Phone: [phone]   Fax:  [phone]  Physical Therapy Treatment  Patient Details  Name: Stuart Smith MRN: [id number] Date of Birth: 1927-07-26 Referring Provider (PT): Dr. Kary Kos   Encounter Date: 09/16/2018  PT End of Session - 09/16/18 0848    Visit Number  3    Number of Visits  13    Date for PT Re-Evaluation  10/13/18    Authorization Type  Progress note 3/10, Last goals: 09/01/18    PT Start Time  0845    PT Stop Time  0930    PT Time Calculation (min)  45 min    Equipment Utilized During Treatment  Gait belt    Activity Tolerance  Patient tolerated treatment well    Behavior During Therapy  [facility] for tasks assessed/performed       Past Medical History:  Diagnosis Date  . Cataracts, bilateral   . Depression   . Elevated PSA   . H/O seasonal allergies   . History of chickenpox   . Hypertension   . Prostate cancer [place]) 2013   Surgery   . Torticollis     Past Surgical History:  Procedure Laterality Date  . BACK SURGERY  1995  . GALLBLADDER SURGERY    . PROSTATE SURGERY  2013    Vitals:   09/16/18 0912  BP: (!) 157/74  Pulse: 94  SpO2: 98%    Subjective Assessment - 09/16/18 0848    Subjective  Pt states that he is doing well on this date. He feels like he is starting to see some of the benefits of the botox. He is still having some trouble with his eyes. He went out and played 5 holes of golf yesterday morning.     Patient is accompained by:  Family member   Wife who remained in the waiting room   Pertinent History  Pt reports that he was washing his head in the sink on 08/19/18 and had a severe bout of dizziness. He reports feeling "totally disoriented." Unclear if he had any vertigo. He states that after he brought his head back up the dizziness resolved. Then the following Sunday he went to stand up during communion at church and got very  dizzy. He came to the [facility] ER and was worked up for a CVA. MRI negative for acute changes and carotid ultrasound showed less than 50% stenosis of the internal carotid arteries bilaterally. Pt was kept overnight for observation and his dizziness persisted into the morning. Pt states that he was seen by physical therapist at the hospital who "threw me around" and it "helped a little bit." Pt was also treated with oral prednisone and meclizine and his symptoms improved. Pt reports that his symptoms are mostly resolved since that time with the exception of some unsteadiness. Pt denies any head trauma or MVA. PMH includes prostate cancer x 6 years for which he has been treated and has resulted in some weakness. Next Friday he will be having botox injections for his chronic torticollis. Prior history of low back surgery. Pt reports that ~1 year ago he had an episode of "haziness" during church, "then everything turned gray" and pt could not stand up. That cleared on it's own after going home and resting. Pt reports some "thickness" feeling on the underside of his feet.     Limitations  Walking    Diagnostic tests  MRI negative  for acute changes and carotid ultrasound showed less than 50% stenosis of the internal carotid arteries bilaterally.    Patient Stated Goals  Improve balance    Currently in Pain?  No/denies          TREATMENT   Neuromuscular Re-education  Deferred DVA on this date again due to patient's continued report of difficulty focusing after Botox injections around eyes; Feet together horziontal head turns x 60s; Semitandem balance alternating forward LE x 30s each; Airex feet together with horizontal head turns x 60s; Airex balance with toe taps to stepping stone alternating LE x 10 each;   Ther-ex  NuStep L2 x 5 minutes for warm-up during history; Quantum leg press 120# x 25, 135# x 25 Quantum heel raises 105# 2 x 20;  Sit to stand without UE support from chair with Airex  cushion on seat x 10, from regular height chair without UE support x 5 with minA+1 assist required for reps 4-5; Standing marches with 3# ankle weights (AW) x 15 bilateral; Standing HS curl with 3# AW x 15 bilateral; Standing hip abduction with 3# AW x 15 bilateral; Heel/toe raises x 10 each direction (added to HEP);   Pt educated throughout session about proper posture and technique with exercises. Improved exercise technique, movement at target joints, use of target muscles after min to mod verbal, visual, tactile cues.    Pt was able to perform sit to stand today from regular height chair for 5 repetitions with very light min assistance toward the last 2 repetitions. He is also able to increase his resistance with leg press. Pt reports feeling some improvement in strength since starting therapy. Unable to attempt DVA test again today due to difficulty focusing secondary to Botox injections last week. Will continue to progress HEP as appropriate in future sessions as well as advancing strengthening and balance exercises. Pt will benefit from PT services to address deficits in strength, balance, and mobility in order to return to full function at home.                          PT Short Term Goals - 09/01/18 2109      PT SHORT TERM GOAL #1   Title  Pt will be independent with HEP in order to improve strength and balance in order to decrease fall risk and improve function at home and work.     Time  3    Period  Weeks    Status  New    Target Date  09/22/18        PT Long Term Goals - 09/01/18 2109      PT LONG TERM GOAL #1   Title  Pt will improve DGI by at least 3 points in order to demonstrate clinically significant improvement in balance and decreased risk for falls.    Baseline  09/01/18: 16/24    Time  6    Period  Weeks    Status  New    Target Date  10/13/18      PT LONG TERM GOAL #2   Title  Pt will be able to perform sit to stand without UE support  in order to improve functional independence with transfers    Baseline  09/01/18: unable to perform    Time  6    Period  Weeks    Status  New    Target Date  10/13/18      PT  LONG TERM GOAL #3   Title  Pt will increase 10MWT by at least 0.13 m/s in order to demonstrate clinically significant improvement in community ambulation.    Baseline  09/01/18: welf-selected: 14.4s = 0.69 m/s, Fastest: 12.7s = 0.79 m/sec    Time  6    Period  Weeks    Status  New    Target Date  10/13/18            Plan - 09/16/18 0848    Clinical Impression Statement  Pt was able to perform sit to stand today from regular height chair for 5 repetitions with very light min assistance toward the last 2 repetitions. He is also able to increase his resistance with leg press. Pt reports feeling some improvement in strength since starting therapy. Unable to attempt DVA test again today due to difficulty focusing secondary to Botox injections last week. Will continue to progress HEP as appropriate in future sessions as well as advancing strengthening and balance exercises. Pt will benefit from PT services to address deficits in strength, balance, and mobility in order to return to full function at home.     Rehab Potential  Good    Clinical Impairments Affecting Rehab Potential  (+) symptoms have resolved    PT Frequency  2x / week    PT Duration  6 weeks    PT Treatment/Interventions  ADLs/Self Care Home Management;Aquatic Therapy;Canalith Repostioning;Cryotherapy;Electrical Stimulation;Iontophoresis 4mg /ml Dexamethasone;Moist Heat;Traction;Ultrasound;DME Instruction;Gait training;Stair training;Functional mobility training;Therapeutic activities;Therapeutic exercise;Balance training;Neuromuscular re-education;Cognitive remediation;Patient/family education;Manual techniques;Passive range of motion;Dry needling;Vestibular    PT Next Visit Plan  DVA if focus has improved. Review HEP and progress balance and  strengthening,     PT Home Exercise Plan  Sit to stand without UE support, static balance with feet together and horizontal head turns, Heel raises (Ok to add toe raises as desired)    Consulted and Agree with Plan of Care  Patient       Patient will benefit from skilled therapeutic intervention in order to improve the following deficits and impairments:  Abnormal gait, Decreased balance, Decreased strength, Difficulty walking  Visit Diagnosis: Unsteadiness on feet  Muscle weakness (generalized)     Problem List Patient Active Problem List   Diagnosis Date Noted  . Dizziness 08/22/2018  . Gait instability 10/27/2016  . Essential hypertension 07/25/2014  . Atherosclerosis of abdominal aorta ([place]) 05/19/2013  . Abnormal EKG 08/09/2012  . Prostate cancer ([place]) 08/09/2012    Phillips Grout PT, DPT, GCS  Tennelle Taflinger 09/16/2018, 1:15 PM  [place] MAIN [facility] SERVICES [address] [place], [place], [address] Phone: [phone]   Fax:  [phone]  Name: Stuart Smith MRN: [id number] Date of Birth: Oct 14, 1927

## 2018-09-21 ENCOUNTER — Ambulatory Visit: Payer: Medicare Other | Attending: Family Medicine

## 2018-09-21 DIAGNOSIS — R2681 Unsteadiness on feet: Secondary | ICD-10-CM | POA: Diagnosis not present

## 2018-09-21 DIAGNOSIS — M6281 Muscle weakness (generalized): Secondary | ICD-10-CM | POA: Insufficient documentation

## 2018-09-21 NOTE — Therapy (Signed)
[place] MAIN [facility] SERVICES [address] [place], [place], [address] Phone: [phone]   Fax:  [phone]  Physical Therapy Treatment  Patient Details  Name: Stuart Smith MRN: [id number] Date of Birth: [birth year] Referring Provider (PT): Dr. Kary Kos   Encounter Date: 09/21/2018  PT End of Session - 09/21/18 1646    Visit Number  4    Number of Visits  13    Date for PT Re-Evaluation  10/13/18    Authorization Type  Progress note 4/10, Last goals: 09/01/18    PT Start Time  1647    PT Stop Time  1730    PT Time Calculation (min)  43 min    Equipment Utilized During Treatment  Gait belt    Activity Tolerance  Patient tolerated treatment well    Behavior During Therapy  [facility] for tasks assessed/performed       Past Medical History:  Diagnosis Date  . Cataracts, bilateral   . Depression   . Elevated PSA   . H/O seasonal allergies   . History of chickenpox   . Hypertension   . Prostate cancer [facility]) 2013   Surgery   . Torticollis     Past Surgical History:  Procedure Laterality Date  . BACK SURGERY  1995  . GALLBLADDER SURGERY    . PROSTATE SURGERY  2013    There were no vitals filed for this visit.  Subjective Assessment - 09/21/18 1646    Subjective  Pt states that he is doing well on this date with the exception of worsensing ptosis of R eye since his botox injection. This is affecting his vision. MD is aware. He reports that he is feeling slightly stronger since starting therapy.     Patient is accompained by:  Family member   Wife who remained in the waiting room   Pertinent History  Pt reports that he was washing his head in the sink on 08/19/18 and had a severe bout of dizziness. He reports feeling "totally disoriented." Unclear if he had any vertigo. He states that after he brought his head back up the dizziness resolved. Then the following Sunday he went to stand up during communion at church and got very dizzy. He  came to the [facility] ER and was worked up for a CVA. MRI negative for acute changes and carotid ultrasound showed less than 50% stenosis of the internal carotid arteries bilaterally. Pt was kept overnight for observation and his dizziness persisted into the morning. Pt states that he was seen by physical therapist at the hospital who "threw me around" and it "helped a little bit." Pt was also treated with oral prednisone and meclizine and his symptoms improved. Pt reports that his symptoms are mostly resolved since that time with the exception of some unsteadiness. Pt denies any head trauma or MVA. PMH includes prostate cancer x 6 years for which he has been treated and has resulted in some weakness. Next Friday he will be having botox injections for his chronic torticollis. Prior history of low back surgery. Pt reports that ~1 year ago he had an episode of "haziness" during church, "then everything turned gray" and pt could not stand up. That cleared on it's own after going home and resting. Pt reports some "thickness" feeling on the underside of his feet.     Limitations  Walking    Diagnostic tests  MRI negative for acute changes and carotid ultrasound showed less than 50% stenosis of  the internal carotid arteries bilaterally.    Patient Stated Goals  Improve balance    Currently in Pain?  No/denies         TREATMENT   Neuromuscular Re-education Deferred DVA on this date again due to patient's continued report of difficulty focusing after Botox injections around eyes; 6" Hurdle steps in staggered stance position x 10 bilateral; Airex feet together with ball pass around body performing horizontal head turns x 10 bouts to each side; Airex balance with toe taps to stepping stone alternating LE x 10 each; Rockerboard static balance in R/L and A/P directions 30s x 2 each; Rockerboard weight shifting in R/L and A/P directions x 10 each;   Ther-ex NuStep L3 x 4 minutes for warm-up during history  (3 minutes unbilled); Sit to stand without UE support from regular height chair without UE support 2 x 5 with minA+1 assist required for final reps of each set, cues to encourage more anterior weight shifting; Standing mini squats 2 x 10 with cues for proper hinge from hips; Standing marches with 4# ankle weights (AW) x 15 bilateral; Standing HS curl with 4# AW x 15 bilateral; Standing hip abduction with 4# AW x 15 bilateral; Heel/toe raises x 15 each direction; Quantum leg press 140# x 20, 150# x 20 Quantum heel raises 120# 2 x 20;    Pt educated throughout session about proper posture and technique with exercises. Improved exercise technique, movement at target joints, use of target muscles after min to mod verbal, visual, tactile cues.    Pt was able to perform sit to stand today from regular height chair for 2 sets of 5 repetitions with very light min assistance toward the last 2 repetitions of each set. He is also able to increase his resistance again with leg press today. Pt reports feeling some improvement in strength since starting therapy. Unable to attempt DVA test again today due to difficulty focusing secondary to Botox injections last week. Will continue to progress HEP as appropriate in future sessions as well as advancing strengthening and balance exercises.Pt will benefit from PT services to address deficits in strength, balance, and mobility in order to return to full function at home.                        PT Short Term Goals - 09/01/18 2109      PT SHORT TERM GOAL #1   Title  Pt will be independent with HEP in order to improve strength and balance in order to decrease fall risk and improve function at home and work.     Time  3    Period  Weeks    Status  New    Target Date  09/22/18        PT Long Term Goals - 09/01/18 2109      PT LONG TERM GOAL #1   Title  Pt will improve DGI by at least 3 points in order to demonstrate clinically  significant improvement in balance and decreased risk for falls.    Baseline  09/01/18: 16/24    Time  6    Period  Weeks    Status  New    Target Date  10/13/18      PT LONG TERM GOAL #2   Title  Pt will be able to perform sit to stand without UE support in order to improve functional independence with transfers    Baseline  09/01/18: unable to  perform    Time  6    Period  Weeks    Status  New    Target Date  10/13/18      PT LONG TERM GOAL #3   Title  Pt will increase 10MWT by at least 0.13 m/s in order to demonstrate clinically significant improvement in community ambulation.    Baseline  09/01/18: welf-selected: 14.4s = 0.69 m/s, Fastest: 12.7s = 0.79 m/sec    Time  6    Period  Weeks    Status  New    Target Date  10/13/18            Plan - 09/22/18 0840    Clinical Impression Statement  Pt was able to perform sit to stand today from regular height chair for 2 sets of 5 repetitions with very light min assistance toward the last 2 repetitions of each set. He is also able to increase his resistance again with leg press today. Pt reports feeling some improvement in strength since starting therapy. Unable to attempt DVA test again today due to difficulty focusing secondary to Botox injections last week. Will continue to progress HEP as appropriate in future sessions as well as advancing strengthening and balance exercises.Pt will benefit from PT services to address deficits in strength, balance, and mobility in order to return to full function at home.    Rehab Potential  Good    Clinical Impairments Affecting Rehab Potential  (+) symptoms have resolved    PT Frequency  2x / week    PT Duration  6 weeks    PT Treatment/Interventions  ADLs/Self Care Home Management;Aquatic Therapy;Canalith Repostioning;Cryotherapy;Electrical Stimulation;Iontophoresis 4mg /ml Dexamethasone;Moist Heat;Traction;Ultrasound;DME Instruction;Gait training;Stair training;Functional mobility  training;Therapeutic activities;Therapeutic exercise;Balance training;Neuromuscular re-education;Cognitive remediation;Patient/family education;Manual techniques;Passive range of motion;Dry needling;Vestibular    PT Next Visit Plan  DVA if focus has improved. Review HEP and progress balance and strengthening,     PT Home Exercise Plan  Sit to stand without UE support, static balance with feet together and horizontal head turns, Heel raises (Ok to add toe raises as desired)    Consulted and Agree with Plan of Care  Patient       Patient will benefit from skilled therapeutic intervention in order to improve the following deficits and impairments:  Abnormal gait, Decreased balance, Decreased strength, Difficulty walking  Visit Diagnosis: Unsteadiness on feet  Muscle weakness (generalized)     Problem List Patient Active Problem List   Diagnosis Date Noted  . Dizziness 08/22/2018  . Gait instability 10/27/2016  . Essential hypertension 07/25/2014  . Atherosclerosis of abdominal aorta ([facility]) 05/19/2013  . Abnormal EKG 08/09/2012  . Prostate cancer ([place]) 08/09/2012   Phillips Grout PT, DPT, GCS  Tabytha Gradillas 09/22/2018, 8:44 AM  [place] MAIN [facility] SERVICES [address] [place], [place], [address] Phone: [phone]   Fax:  [phone]  Name: Stuart Smith MRN: [id number] Date of Birth: 07-14-1927

## 2018-09-23 ENCOUNTER — Ambulatory Visit: Payer: Medicare Other | Admitting: Physical Therapy

## 2018-09-23 ENCOUNTER — Encounter: Payer: Self-pay | Admitting: Physical Therapy

## 2018-09-23 DIAGNOSIS — R2681 Unsteadiness on feet: Secondary | ICD-10-CM

## 2018-09-23 DIAGNOSIS — M6281 Muscle weakness (generalized): Secondary | ICD-10-CM

## 2018-09-23 NOTE — Therapy (Signed)
[place] MAIN [facility] SERVICES [address] [place], [place], [address] Phone: [phone]   Fax:  [phone]  Physical Therapy Treatment  Patient Details  Name: Stuart Smith MRN: [id number] Date of Birth: 1927-08-25 Referring Provider (PT): Dr. Kary Kos   Encounter Date: 09/23/2018  PT End of Session - 09/23/18 0855    Visit Number  5    Number of Visits  13    Date for PT Re-Evaluation  10/13/18    Authorization Type  Progress note 4/10, Last goals: 09/01/18    PT Start Time  0847    PT Stop Time  0934    PT Time Calculation (min)  47 min    Equipment Utilized During Treatment  Gait belt    Activity Tolerance  Patient tolerated treatment well    Behavior During Therapy  [facility] for tasks assessed/performed       Past Medical History:  Diagnosis Date  . Cataracts, bilateral   . Depression   . Elevated PSA   . H/O seasonal allergies   . History of chickenpox   . Hypertension   . Prostate cancer [facility]) 2013   Surgery   . Torticollis     Past Surgical History:  Procedure Laterality Date  . BACK SURGERY  1995  . GALLBLADDER SURGERY    . PROSTATE SURGERY  2013    There were no vitals filed for this visit.  Subjective Assessment - 09/23/18 0853    Subjective  Patient reports that he is feeling well and has found a temporary solution for his R eye ptosis. He is using eye drops prescribed by the MD to help relieve some of the ptsosis. He uses them 3x/day and states that it does help, but he is hopeful that the next botox injections will work better. He reports no other significant changes since his last visit.    Patient is accompained by:  Family member   Wife who remained in the waiting room   Pertinent History  Pt reports that he was washing his head in the sink on 08/19/18 and had a severe bout of dizziness. He reports feeling "totally disoriented." Unclear if he had any vertigo. He states that after he brought his head back up the  dizziness resolved. Then the following Sunday he went to stand up during communion at church and got very dizzy. He came to the [facility] ER and was worked up for a CVA. MRI negative for acute changes and carotid ultrasound showed less than 50% stenosis of the internal carotid arteries bilaterally. Pt was kept overnight for observation and his dizziness persisted into the morning. Pt states that he was seen by physical therapist at the hospital who "threw me around" and it "helped a little bit." Pt was also treated with oral prednisone and meclizine and his symptoms improved. Pt reports that his symptoms are mostly resolved since that time with the exception of some unsteadiness. Pt denies any head trauma or MVA. PMH includes prostate cancer x 6 years for which he has been treated and has resulted in some weakness. Next Friday he will be having botox injections for his chronic torticollis. Prior history of low back surgery. Pt reports that ~1 year ago he had an episode of "haziness" during church, "then everything turned gray" and pt could not stand up. That cleared on it's own after going home and resting. Pt reports some "thickness" feeling on the underside of his feet.  Limitations  Walking    Diagnostic tests  MRI negative for acute changes and carotid ultrasound showed less than 50% stenosis of the internal carotid arteries bilaterally.    Patient Stated Goals  Improve balance    Currently in Pain?  No/denies        Neuromuscular Re-education 6" Hurdle steps in staggered stance position 2 x 10 bilateral; Airex feet together with ball pass over shoulder to increase erect posture and address torticollis; performing horizontal head turns 2x10 each; Airex balance with toe taps to stepping stone BLE x 10 each, x10 alternating with LE crossing midline  Patient educated extensively on balance strategies: wide BOS, speed/pace of activity, weight shift, and full foot contact with standing surface. Patient  was able to apply concepts immediately and successfully with minimal continued verbal cues.    Therapeutic exercise NuStep L3 x 5 minutes for warm-up during history (3 minutes unbilled); Sit to stand without UE support:  from regular height chair, MinA. Patient unable to perform safely.  from chair with airex, SBA, 3x10 with verbal cues for hip hinge to control descent to the chair Quantum leg press 150# 2x20 Quantum heel raises 120# x 20;    PT Education - 09/23/18 1712    Education Details  Patient educated on hip hinge technique for controlled descent to sitting; balance strategies including weight shift, full foot contact, wide BOS, and stacked posture.     Person(s) Educated  Patient    Methods  Explanation;Demonstration;Verbal cues;Tactile cues    Comprehension  Verbalized understanding;Returned demonstration;Need further instruction       PT Short Term Goals - 09/01/18 2109      PT SHORT TERM GOAL #1   Title  Pt will be independent with HEP in order to improve strength and balance in order to decrease fall risk and improve function at home and work.     Time  3    Period  Weeks    Status  New    Target Date  09/22/18        PT Long Term Goals - 09/01/18 2109      PT LONG TERM GOAL #1   Title  Pt will improve DGI by at least 3 points in order to demonstrate clinically significant improvement in balance and decreased risk for falls.    Baseline  09/01/18: 16/24    Time  6    Period  Weeks    Status  New    Target Date  10/13/18      PT LONG TERM GOAL #2   Title  Pt will be able to perform sit to stand without UE support in order to improve functional independence with transfers    Baseline  09/01/18: unable to perform    Time  6    Period  Weeks    Status  New    Target Date  10/13/18      PT LONG TERM GOAL #3   Title  Pt will increase 10MWT by at least 0.13 m/s in order to demonstrate clinically significant improvement in community ambulation.    Baseline   09/01/18: welf-selected: 14.4s = 0.69 m/s, Fastest: 12.7s = 0.79 m/sec    Time  6    Period  Weeks    Status  New    Target Date  10/13/18            Plan - 09/23/18 1717    Clinical Impression Statement Patient presents to clinic with increased  limited vision in the R eye 2/2 to continued ptosis and was amenable to therapy. Patient continues to have deficits in balance, strength, and mobility, but demonstrates motivation during the session. During balance activities, patient responded well to specific strategies and education (wide BOS, weight-shift, full foot contact, decreased speed) with improved repetitions immediately after instruction and demonstration. Patient continues to require seated rest breaks 2/2 to decreased endurance and benefits from increased verbal cues when fatigued. Patient will continue to benefit from skilled therapeutic intervention to address the aforementioned deficits, decrease risk of falls, and improve overall QOL.   Rehab Potential  Good    Clinical Impairments Affecting Rehab Potential  (+) symptoms have resolved    PT Frequency  2x / week    PT Duration  6 weeks    PT Treatment/Interventions  ADLs/Self Care Home Management;Aquatic Therapy;Canalith Repostioning;Cryotherapy;Electrical Stimulation;Iontophoresis 4mg /ml Dexamethasone;Moist Heat;Traction;Ultrasound;DME Instruction;Gait training;Stair training;Functional mobility training;Therapeutic activities;Therapeutic exercise;Balance training;Neuromuscular re-education;Cognitive remediation;Patient/family education;Manual techniques;Passive range of motion;Dry needling;Vestibular    PT Next Visit Plan  DVA if focus has improved. Review HEP and progress balance and strengthening,     PT Home Exercise Plan  Sit to stand without UE support, static balance with feet together and horizontal head turns, Heel raises (Ok to add toe raises as desired)    Consulted and Agree with Plan of Care  Patient       Patient  will benefit from skilled therapeutic intervention in order to improve the following deficits and impairments:  Abnormal gait, Decreased balance, Decreased strength, Difficulty walking  Visit Diagnosis: Unsteadiness on feet  Muscle weakness (generalized)     Problem List Patient Active Problem List   Diagnosis Date Noted  . Dizziness 08/22/2018  . Gait instability 10/27/2016  . Essential hypertension 07/25/2014  . Atherosclerosis of abdominal aorta ([place]) 05/19/2013  . Abnormal EKG 08/09/2012  . Prostate cancer [facility]) 08/09/2012   Myles Gip PT, DPT [id number] 09/23/2018, 5:17 PM  [place] MAIN [facility] SERVICES [address] [place], [place], [address] Phone: [phone]   Fax:  [phone]  Name: TANUSH DREES MRN: [id number] Date of Birth: 1927-09-01

## 2018-09-24 ENCOUNTER — Ambulatory Visit: Payer: Medicare Other

## 2018-09-28 ENCOUNTER — Encounter: Payer: Self-pay | Admitting: Physical Therapy

## 2018-09-28 ENCOUNTER — Ambulatory Visit: Payer: Medicare Other | Admitting: Physical Therapy

## 2018-09-28 DIAGNOSIS — R2681 Unsteadiness on feet: Secondary | ICD-10-CM | POA: Diagnosis not present

## 2018-09-28 DIAGNOSIS — M6281 Muscle weakness (generalized): Secondary | ICD-10-CM

## 2018-09-28 NOTE — Therapy (Addendum)
[place] MAIN [facility] SERVICES [address] [place], [place], [address] Phone: [phone]   Fax:  [phone]  Physical Therapy Treatment  Patient Details  Name: Stuart Smith MRN: [id number] Date of Birth: 1927/02/05 Referring Provider (PT): Dr. Kary Kos   Encounter Date: 09/28/2018  PT End of Session - 09/28/18 0920    Visit Number  6    Number of Visits  13    Date for PT Re-Evaluation  10/13/18    Authorization Type  Progress note 5/10, Last goals: 09/01/18    PT Start Time  0930    PT Stop Time  1015    PT Time Calculation (min)  45 min    Equipment Utilized During Treatment  Gait belt    Activity Tolerance  Patient tolerated treatment well    Behavior During Therapy  [facility] for tasks assessed/performed       Past Medical History:  Diagnosis Date  . Cataracts, bilateral   . Depression   . Elevated PSA   . H/O seasonal allergies   . History of chickenpox   . Hypertension   . Prostate cancer [facility]) 2013   Surgery   . Torticollis     Past Surgical History:  Procedure Laterality Date  . BACK SURGERY  1995  . GALLBLADDER SURGERY    . PROSTATE SURGERY  2013    There were no vitals filed for this visit.  Subjective Assessment - 09/28/18 0920    Subjective  Patient states he is feeling tired today having had appointments with his oncologist at [facility] yesterday. Per the patient, the oncology team has decided to defer the next treatment for 6 months to allow for the completion of PT and to resolve the issues with R eye ptosis. Patient reports no falls since his last visit, but does state that he was a little off balance when standing up and turning.   Patient is accompained by:  Family member   Wife who remained in the waiting room   Pertinent History  Pt reports that he was washing his head in the sink on 08/19/18 and had a severe bout of dizziness. He reports feeling "totally disoriented." Unclear if he had any vertigo.  He states that after he brought his head back up the dizziness resolved. Then the following Sunday he went to stand up during communion at church and got very dizzy. He came to the [facility] ER and was worked up for a CVA. MRI negative for acute changes and carotid ultrasound showed less than 50% stenosis of the internal carotid arteries bilaterally. Pt was kept overnight for observation and his dizziness persisted into the morning. Pt states that he was seen by physical therapist at the hospital who "threw me around" and it "helped a little bit." Pt was also treated with oral prednisone and meclizine and his symptoms improved. Pt reports that his symptoms are mostly resolved since that time with the exception of some unsteadiness. Pt denies any head trauma or MVA. PMH includes prostate cancer x 6 years for which he has been treated and has resulted in some weakness. Next Friday he will be having botox injections for his chronic torticollis. Prior history of low back surgery. Pt reports that ~1 year ago he had an episode of "haziness" during church, "then everything turned gray" and pt could not stand up. That cleared on it's own after going home and resting. Pt reports some "thickness" feeling on the underside of his  feet.     Limitations  Walking    Diagnostic tests  MRI negative for acute changes and carotid ultrasound showed less than 50% stenosis of the internal carotid arteries bilaterally.    Patient Stated Goals  Improve balance       Neuromuscular Re-education 6" Hurdle steps back in staggered stance position x 5 bilateral; Airex feet together, EC, 2x 30 sec with VCs and education on ankle strategy for maintaining center of mass over feet Airex balance with toe taps to stepping stone alternating LE x 10 each;    Ther-ex NuStep seat 11, L3 x 5 minutes for warm-up during history (3 minutes unbilled); Sit to stand without UE support from regular height chair without UE support 1 x 5 with minA+1  assist required, cues to encourage more anterior weight shifting; Sit to stand without UE, with airex pad on chair, 5x5, VCs for hip hinge and whole foot contact Standing marches with 4# ankle weights (AW) 2 x 10 bilateral; Standing HS curl with 4# AW 2 x 12 bilateral; Standing hip abduction with 4# AW 2 x 10 bilateral;  Patient educated on exercise technique and balance strategies throughout session.   PT Short Term Goals - 09/01/18 2109      PT SHORT TERM GOAL #1   Title  Pt will be independent with HEP in order to improve strength and balance in order to decrease fall risk and improve function at home and work.     Time  3    Period  Weeks    Status  New    Target Date  09/22/18        PT Long Term Goals - 09/01/18 2109      PT LONG TERM GOAL #1   Title  Pt will improve DGI by at least 3 points in order to demonstrate clinically significant improvement in balance and decreased risk for falls.    Baseline  09/01/18: 16/24    Time  6    Period  Weeks    Status  New    Target Date  10/13/18      PT LONG TERM GOAL #2   Title  Pt will be able to perform sit to stand without UE support in order to improve functional independence with transfers    Baseline  09/01/18: unable to perform    Time  6    Period  Weeks    Status  New    Target Date  10/13/18      PT LONG TERM GOAL #3   Title  Pt will increase 10MWT by at least 0.13 m/s in order to demonstrate clinically significant improvement in community ambulation.    Baseline  09/01/18: welf-selected: 14.4s = 0.69 m/s, Fastest: 12.7s = 0.79 m/sec    Time  6    Period  Weeks    Status  New    Target Date  10/13/18            Plan - 09/28/18 0920    Clinical Impression Statement  Patient continues to present to clinic with excellent motivation and is amenable to therapy. Patient demonstrated carryover in body mechanics for optimal safety and strength when performing sit<>stand with minimal VCs required. Patient continues  to demonstrate deficits in mobility and strength as evidenced by his need for seated rest breaks throughout the session. Patient will continue to benefit from skilled therapeutic intervention to address deficts in balance, strength, mobility, and activity tolerance in order to  improve overall QOL and safety in the community.   Rehab Potential  Good    Clinical Impairments Affecting Rehab Potential  (+) symptoms have resolved    PT Frequency  2x / week    PT Duration  6 weeks    PT Treatment/Interventions  ADLs/Self Care Home Management;Aquatic Therapy;Canalith Repostioning;Cryotherapy;Electrical Stimulation;Iontophoresis 4mg /ml Dexamethasone;Moist Heat;Traction;Ultrasound;DME Instruction;Gait training;Stair training;Functional mobility training;Therapeutic activities;Therapeutic exercise;Balance training;Neuromuscular re-education;Cognitive remediation;Patient/family education;Manual techniques;Passive range of motion;Dry needling;Vestibular    PT Next Visit Plan  DVA if focus has improved. Review HEP and progress balance and strengthening,     PT Home Exercise Plan  Sit to stand without UE support, static balance with feet together and horizontal head turns, Heel raises (Ok to add toe raises as desired)    Consulted and Agree with Plan of Care  Patient       Patient will benefit from skilled therapeutic intervention in order to improve the following deficits and impairments:  Abnormal gait, Decreased balance, Decreased strength, Difficulty walking  Visit Diagnosis: Unsteadiness on feet  Muscle weakness (generalized)     Problem List Patient Active Problem List   Diagnosis Date Noted  . Dizziness 08/22/2018  . Gait instability 10/27/2016  . Essential hypertension 07/25/2014  . Atherosclerosis of abdominal aorta ([place]) 05/19/2013  . Abnormal EKG 08/09/2012  . Prostate cancer [facility]) 08/09/2012    Myles Gip PT, DPT [phone] 09/28/2018, 9:21 AM  [place] MAIN [facility] SERVICES [address] [place], [place], [address] Phone: [phone]   Fax:  [phone]  Name: Stuart Smith MRN: [id number] Date of Birth: Mar 18, 1927

## 2018-10-01 ENCOUNTER — Ambulatory Visit: Payer: Medicare Other

## 2018-10-01 DIAGNOSIS — M6281 Muscle weakness (generalized): Secondary | ICD-10-CM

## 2018-10-01 DIAGNOSIS — R2681 Unsteadiness on feet: Secondary | ICD-10-CM

## 2018-10-01 NOTE — Therapy (Signed)
[place] MAIN [facility] SERVICES [address] [place], [place], [address] Phone: [phone]   Fax:  [phone]  Physical Therapy Treatment  Patient Details  Name: Stuart Smith MRN: [id number] Date of Birth: October 23, 1927 Referring Provider (PT): Dr. Kary Kos   Encounter Date: 10/01/2018  PT End of Session - 10/01/18 0855    Visit Number  7    Number of Visits  13    Date for PT Re-Evaluation  10/13/18    Authorization Type  Progress note 7/10, Last goals: 09/01/18    PT Start Time  0850    PT Stop Time  0935    PT Time Calculation (min)  45 min    Equipment Utilized During Treatment  Gait belt    Activity Tolerance  Patient tolerated treatment well    Behavior During Therapy  [facility] for tasks assessed/performed       Past Medical History:  Diagnosis Date  . Cataracts, bilateral   . Depression   . Elevated PSA   . H/O seasonal allergies   . History of chickenpox   . Hypertension   . Prostate cancer [facility]) 2013   Surgery   . Torticollis     Past Surgical History:  Procedure Laterality Date  . BACK SURGERY  1995  . GALLBLADDER SURGERY    . PROSTATE SURGERY  2013    There were no vitals filed for this visit.  Subjective Assessment - 10/01/18 0854    Subjective  Pt reports that he is doing well on this date. States that his eye is improving with the drops. No pain today. No specific questions or concerns currently.     Patient is accompained by:  Family member   Wife who remained in the waiting room   Pertinent History  Pt reports that he was washing his head in the sink on 08/19/18 and had a severe bout of dizziness. He reports feeling "totally disoriented." Unclear if he had any vertigo. He states that after he brought his head back up the dizziness resolved. Then the following Sunday he went to stand up during communion at church and got very dizzy. He came to the [facility] ER and was worked up for a CVA. MRI negative for acute changes  and carotid ultrasound showed less than 50% stenosis of the internal carotid arteries bilaterally. Pt was kept overnight for observation and his dizziness persisted into the morning. Pt states that he was seen by physical therapist at the hospital who "threw me around" and it "helped a little bit." Pt was also treated with oral prednisone and meclizine and his symptoms improved. Pt reports that his symptoms are mostly resolved since that time with the exception of some unsteadiness. Pt denies any head trauma or MVA. PMH includes prostate cancer x 6 years for which he has been treated and has resulted in some weakness. Next Friday he will be having botox injections for his chronic torticollis. Prior history of low back surgery. Pt reports that ~1 year ago he had an episode of "haziness" during church, "then everything turned gray" and pt could not stand up. That cleared on it's own after going home and resting. Pt reports some "thickness" feeling on the underside of his feet.     Limitations  Walking    Diagnostic tests  MRI negative for acute changes and carotid ultrasound showed less than 50% stenosis of the internal carotid arteries bilaterally.    Patient Stated Goals  Improve balance  Currently in Pain?  No/denies             TREATMENT   Ther-ex Octane HIIT L5 x 30s, L2 x 30s for 5 minutes with 1 minute cool down during history. Fatigue monitored (2 minutes unbilled); Quantum leg press 165# 2 x 20; Standing marches with 5# ankle weights (AW) x 15 bilateral; Standing hip extensionl with 5# AW x 15 bilateral; Standing HS curl with 5# AW x 15 bilateral; Standing hip abduction with 5# AW x 15 bilateral; Sit to stand without UE support from regular height chair without UE support x 10, x 8 with minA+1 assist required for final 4 reps of each set, cues to encourage more anterior weight shifting;   Neuromuscular Re-education Standing heel/toe raises x 15 each direction; Airex feet  together with ball pass around body performing horizontal head turns x 10 bouts to each side; Airex feet together balance with eyes closed 30s x 2; Airex toe taps to 6" step with stepping stone on top alternating LE x 10 each side;   Pt educated throughout session about proper posture and technique with exercises. Improved exercise technique, movement at target joints, use of target muscles after min to mod verbal, visual, tactile cues.   Pt was able to perform sit to stand today from regular height chair for increased repetitions with very light min assistance toward the end of each set. He is very pleased with his improvement with respect to sit to stand exercise. He is also able to increase his resistance again with leg press today.Unable to attempt DVA testagaintoday due to difficulty focusing secondary to Botox injections last week. Pt will need updated outcome measures and goals at next visit. Will continue to progress HEP as appropriate in future sessions as well as advancing strengthening and balance exercises.Pt will benefit from PT services to address deficits in strength, balance, and mobility in order to return to full function at home.                       PT Short Term Goals - 09/01/18 2109      PT SHORT TERM GOAL #1   Title  Pt will be independent with HEP in order to improve strength and balance in order to decrease fall risk and improve function at home and work.     Time  3    Period  Weeks    Status  New    Target Date  09/22/18        PT Long Term Goals - 09/01/18 2109      PT LONG TERM GOAL #1   Title  Pt will improve DGI by at least 3 points in order to demonstrate clinically significant improvement in balance and decreased risk for falls.    Baseline  09/01/18: 16/24    Time  6    Period  Weeks    Status  New    Target Date  10/13/18      PT LONG TERM GOAL #2   Title  Pt will be able to perform sit to stand without UE support in  order to improve functional independence with transfers    Baseline  09/01/18: unable to perform    Time  6    Period  Weeks    Status  New    Target Date  10/13/18      PT LONG TERM GOAL #3   Title  Pt will increase 10MWT by at  least 0.13 m/s in order to demonstrate clinically significant improvement in community ambulation.    Baseline  09/01/18: welf-selected: 14.4s = 0.69 m/s, Fastest: 12.7s = 0.79 m/sec    Time  6    Period  Weeks    Status  New    Target Date  10/13/18            Plan - 10/01/18 0856    Clinical Impression Statement  Pt was able to perform sit to stand today from regular height chair for increased repetitions with very light min assistance toward the end of each set. He is very pleased with his improvement with respect to sit to stand exercise. He is also able to increase his resistance again with leg press today.Unable to attempt DVA testagaintoday due to difficulty focusing secondary to Botox injections last week. Pt will need updated outcome measures and goals at next visit. Will continue to progress HEP as appropriate in future sessions as well as advancing strengthening and balance exercises.Pt will benefit from PT services to address deficits in strength, balance, and mobility in order to return to full function at home.    Clinical Decision Making  Moderate    Rehab Potential  Good    Clinical Impairments Affecting Rehab Potential  (+) symptoms have resolved    PT Frequency  2x / week    PT Duration  6 weeks    PT Treatment/Interventions  ADLs/Self Care Home Management;Aquatic Therapy;Canalith Repostioning;Cryotherapy;Electrical Stimulation;Iontophoresis 4mg /ml Dexamethasone;Moist Heat;Traction;Ultrasound;DME Instruction;Gait training;Stair training;Functional mobility training;Therapeutic activities;Therapeutic exercise;Balance training;Neuromuscular re-education;Cognitive remediation;Patient/family education;Manual techniques;Passive range of  motion;Dry needling;Vestibular    PT Next Visit Plan  Outcome measures, update goals, DVA if focus has improved. Review HEP and progress balance and strengthening,     PT Home Exercise Plan  Sit to stand without UE support, static balance with feet together and horizontal head turns, Heel raises (Ok to add toe raises as desired)    Consulted and Agree with Plan of Care  Patient       Patient will benefit from skilled therapeutic intervention in order to improve the following deficits and impairments:  Abnormal gait, Decreased balance, Decreased strength, Difficulty walking  Visit Diagnosis: Unsteadiness on feet  Muscle weakness (generalized)     Problem List Patient Active Problem List   Diagnosis Date Noted  . Dizziness 08/22/2018  . Gait instability 10/27/2016  . Essential hypertension 07/25/2014  . Atherosclerosis of abdominal aorta ([place]) 05/19/2013  . Abnormal EKG 08/09/2012  . Prostate cancer ([place]) 08/09/2012   Phillips Grout PT, DPT, GCS  Cecilia Vancleve 10/01/2018, 12:53 PM  [place] MAIN [facility] SERVICES [address] [place], [place], [address] Phone: [phone]   Fax:  [phone]  Name: MCKADE GURKA MRN: [id number] Date of Birth: [birth year]

## 2018-10-04 ENCOUNTER — Ambulatory Visit: Payer: Medicare Other

## 2018-10-05 ENCOUNTER — Encounter: Payer: Self-pay | Admitting: Physical Therapy

## 2018-10-05 ENCOUNTER — Ambulatory Visit: Payer: Medicare Other | Admitting: Physical Therapy

## 2018-10-05 DIAGNOSIS — R2681 Unsteadiness on feet: Secondary | ICD-10-CM | POA: Diagnosis not present

## 2018-10-05 DIAGNOSIS — M6281 Muscle weakness (generalized): Secondary | ICD-10-CM

## 2018-10-06 NOTE — Therapy (Addendum)
[place] MAIN [facility] SERVICES [address] [place], [place], [address] Phone: [phone]   Fax:  [phone]  Physical Therapy Treatment Physical Therapy Progress Note   Dates of reporting period  09/01/2018   to   10/05/2018   Patient Details  Name: Stuart Smith MRN: [id number] Date of Birth: 1926/12/01 Referring Provider (PT): Dr. Kary Kos   Encounter Date: 10/05/2018  PT End of Session - 10/05/18 1125    Visit Number  8    Number of Visits  13    Date for PT Re-Evaluation  10/13/18    Authorization Type  Progress note 8/10, Last goals: 09/01/18    PT Start Time  1115    PT Stop Time  1200    PT Time Calculation (min)  45 min    Equipment Utilized During Treatment  Gait belt    Activity Tolerance  Patient tolerated treatment well    Behavior During Therapy  [facility] for tasks assessed/performed       Past Medical History:  Diagnosis Date  . Cataracts, bilateral   . Depression   . Elevated PSA   . H/O seasonal allergies   . History of chickenpox   . Hypertension   . Prostate cancer [facility]) 2013   Surgery   . Torticollis     Past Surgical History:  Procedure Laterality Date  . BACK SURGERY  1995  . GALLBLADDER SURGERY    . PROSTATE SURGERY  2013    There were no vitals filed for this visit.  Subjective Assessment - 10/05/18 1123    Subjective  Patient states that he noticed upon waking that his eyes are not responding to the drops as well as he would like. He feels like both eyes are closed today more than usual and are more sensitive to the bright lights. Other than that he reports no significant changes or falls since his last visit.    Patient is accompained by:  Family member   Wife who remained in the waiting room   Pertinent History  Pt reports that he was washing his head in the sink on 08/19/18 and had a severe bout of dizziness. He reports feeling "totally disoriented." Unclear if he had any vertigo. He states that  after he brought his head back up the dizziness resolved. Then the following Sunday he went to stand up during communion at church and got very dizzy. He came to the [facility] ER and was worked up for a CVA. MRI negative for acute changes and carotid ultrasound showed less than 50% stenosis of the internal carotid arteries bilaterally. Pt was kept overnight for observation and his dizziness persisted into the morning. Pt states that he was seen by physical therapist at the hospital who "threw me around" and it "helped a little bit." Pt was also treated with oral prednisone and meclizine and his symptoms improved. Pt reports that his symptoms are mostly resolved since that time with the exception of some unsteadiness. Pt denies any head trauma or MVA. PMH includes prostate cancer x 6 years for which he has been treated and has resulted in some weakness. Next Friday he will be having botox injections for his chronic torticollis. Prior history of low back surgery. Pt reports that ~1 year ago he had an episode of "haziness" during church, "then everything turned gray" and pt could not stand up. That cleared on it's own after going home and resting. Pt reports some "thickness" feeling on the  underside of his feet.     Limitations  Walking    Diagnostic tests  MRI negative for acute changes and carotid ultrasound showed less than 50% stenosis of the internal carotid arteries bilaterally.    Patient Stated Goals  Improve balance    Currently in Pain?  No/denies       TREATMENT  Therapeutic Exercise: Nu-Step, L4, x5 min. History taken. VCs for maintaining speed >60 spm. Quantum Leg Press Reverse Pyramid Set 120# x5 150# x10 165# x12 170# x8 STS regular height chair, UE reach, SBA/CGA 2x5 (interspersed throughout session to allow for high quality movement without compensations/LOB)  Patient educated on proper mechanics and overall progress throughout the session.  Neuromuscular Re-education: DGI: 22/24  (stair negotiation and obstacle negotiation deficits) // bars: Airex:  EO, EC, no UE 2x15 sec each 10 MWT:  No AD, SBA, self-selected: 12.2s= 0.22m/s fastest: 9.2s= 1.03 m/s      PT Short Term Goals - 10/05/18 1126      PT SHORT TERM GOAL #1   Title  Pt will be independent with HEP in order to improve strength and balance in order to decrease fall risk and improve function at home and work.     Time  3    Period  Weeks    Status  Achieved    Target Date  09/22/18        PT Long Term Goals - 10/05/18 1127      PT LONG TERM GOAL #1   Title  Pt will improve DGI by at least 3 points in order to demonstrate clinically significant improvement in balance and decreased risk for falls.    Baseline  09/01/18: 16/24; 10/05/18: 22/24    Time  6    Period  Weeks    Status  Achieved    Target Date  10/13/18      PT LONG TERM GOAL #2   Title  Pt will be able to perform sit to stand without UE support in order to improve functional independence with transfers    Baseline  09/01/18: unable to perform; 10/05/18: patient able to perform from standard chair with UE reach    Time  6    Period  Weeks    Status  Achieved    Target Date  10/13/18      PT LONG TERM GOAL #3   Title  Pt will increase 10MWT by at least 0.13 m/s in order to demonstrate clinically significant improvement in community ambulation.    Baseline  09/01/18: welf-selected: 14.4s = 0.69 m/s, Fastest: 12.7s = 0.79 m/sec; 10/05/18: self-selected: 12.2s= 0.79m/s fastest: 9.2s= 1.03 m/s    Time  6    Period  Weeks    Status  On-going    Target Date  11/03/18      PT LONG TERM GOAL #4   Title  Patient will demonstrate improved BLE strength and balance as evidenced by his ability to independently perform 5TSTS without UE support or UE reach from a standard height chair without LOB in order to decrease risk of falls at home and in the community.    Baseline  10/05/2018: UE reach, SBA/CGA for increased repetitions    Time  4     Period  Weeks    Status  New    Target Date  11/03/18      PT LONG TERM GOAL #5   Title  Patient will demonstrate step-through gait pattern independently when descending stairs without  UE support to improve mobility in the community and optimize independence.    Baseline  10/05/2018: step-to with LUE support    Time  4    Period  Weeks    Status  New    Target Date  11/03/18            Plan - 10/06/18 1446    Clinical Impression Statement  Patient presents to clinic with decreased vision 2/2 to ptosis bilaterally, but was amenable to therapy. Patient continues to demonstrate improvements in strength and balance as evidenced by his ability to perform sit to stand from regular height chair with SBA and minimal verbal cues for optimal muscle engagement. Additionally, patient demonstrates improved balance as evidenced by his Dynamic Gait Index score of 22/24 demonstrating decreased risk of falls. Patient continues to have deficits in stair negotiation and obstacle negotiation which will benefit from further skilled therapeutic intervention. Patient demonstrates improved mobility apparent with his gait speed during the 10 MWT (fast 1.03 m/s), but his self-selected pace remains below the threshold for safe community ambulation. Patient's condition has the potential to improve in response to therapy. Maximum improvement is yet to be obtained. The anticipated improvement is attainable and reasonable in a generally predictable time.  Patient will continue to benefit from skilled therapeutic intervention to optimize independence and improve overall QOL.      Rehab Potential  Good    Clinical Impairments Affecting Rehab Potential  (+) symptoms have resolved    PT Frequency  2x / week    PT Duration  4 weeks    PT Treatment/Interventions  ADLs/Self Care Home Management;Aquatic Therapy;Canalith Repostioning;Cryotherapy;Electrical Stimulation;Iontophoresis 4mg /ml Dexamethasone;Moist  Heat;Traction;Ultrasound;DME Instruction;Gait training;Stair training;Functional mobility training;Therapeutic activities;Therapeutic exercise;Balance training;Neuromuscular re-education;Cognitive remediation;Patient/family education;Manual techniques;Passive range of motion;Dry needling;Vestibular    PT Next Visit Plan DVA if focus has improved. Review HEP and progress balance and strengthening,     PT Home Exercise Plan  Sit to stand without UE support, static balance with feet together and horizontal head turns, Heel raises (Ok to add toe raises as desired)    Consulted and Agree with Plan of Care  Patient       Patient will benefit from skilled therapeutic intervention in order to improve the following deficits and impairments:  Abnormal gait, Decreased balance, Decreased strength, Difficulty walking  Visit Diagnosis: Muscle weakness (generalized)  Unsteadiness on feet     Problem List Patient Active Problem List   Diagnosis Date Noted  . Dizziness 08/22/2018  . Gait instability 10/27/2016  . Essential hypertension 07/25/2014  . Atherosclerosis of abdominal aorta ([place]) 05/19/2013  . Abnormal EKG 08/09/2012  . Prostate cancer [facility]) 08/09/2012   Myles Gip PT, DPT [phone] 10/06/2018, 3:12 PM  [place] MAIN [facility] SERVICES [address] [place], [place], [address] Phone: [phone]   Fax:  [phone]  Name: Stuart Smith MRN: [id number] Date of Birth: 1927/07/28

## 2018-10-06 NOTE — Addendum Note (Signed)
Addended by: Louie Casa on: 10/06/2018 03:41 PM   Modules accepted: Orders

## 2018-10-07 ENCOUNTER — Ambulatory Visit: Payer: Medicare Other | Admitting: Physical Therapy

## 2018-10-07 DIAGNOSIS — R2681 Unsteadiness on feet: Secondary | ICD-10-CM | POA: Diagnosis not present

## 2018-10-07 DIAGNOSIS — M6281 Muscle weakness (generalized): Secondary | ICD-10-CM

## 2018-10-07 NOTE — Therapy (Signed)
[place] MAIN [facility] SERVICES [address] [place], [place], [address] Phone: [phone]   Fax:  [phone]  Physical Therapy Treatment  Patient Details  Name: Stuart Smith MRN: [id number] Date of Birth: 25-Mar-1927 Referring Provider (PT): Dr. Kary Kos   Encounter Date: 10/07/2018  PT End of Session - 10/07/18 0922    Visit Number  9    Number of Visits  16    Date for PT Re-Evaluation  11/03/18    Authorization Type  Progress note 1/10, Last goals: 10/05/2018    PT Start Time  0925    PT Stop Time  1010    PT Time Calculation (min)  45 min    Equipment Utilized During Treatment  Gait belt    Activity Tolerance  Patient tolerated treatment well    Behavior During Therapy  [facility] for tasks assessed/performed       Past Medical History:  Diagnosis Date  . Cataracts, bilateral   . Depression   . Elevated PSA   . H/O seasonal allergies   . History of chickenpox   . Hypertension   . Prostate cancer [facility]) 2013   Surgery   . Torticollis     Past Surgical History:  Procedure Laterality Date  . BACK SURGERY  1995  . GALLBLADDER SURGERY    . PROSTATE SURGERY  2013    There were no vitals filed for this visit.  Subjective Assessment - 10/07/18 0943    Subjective  Patient reports that he feels his eyes are much better today and he is in communication with his MD regarding the status of his eyes. Patient states that he has a cane at home but is unsure how to use and would like to spend some time today working on walking with a SPC. Patient reports that he has an old R groin injury athat occasionally gives him some trouble and he feels his RLE is less reliable than his LLE because of this., but admits that overall both of his LEs are much stronger now than the start of therapy.    Patient is accompained by:  Family member   Wife who remained in the waiting room   Pertinent History  Pt reports that he was washing his head in the sink on  08/19/18 and had a severe bout of dizziness. He reports feeling "totally disoriented." Unclear if he had any vertigo. He states that after he brought his head back up the dizziness resolved. Then the following Sunday he went to stand up during communion at church and got very dizzy. He came to the [facility] ER and was worked up for a CVA. MRI negative for acute changes and carotid ultrasound showed less than 50% stenosis of the internal carotid arteries bilaterally. Pt was kept overnight for observation and his dizziness persisted into the morning. Pt states that he was seen by physical therapist at the hospital who "threw me around" and it "helped a little bit." Pt was also treated with oral prednisone and meclizine and his symptoms improved. Pt reports that his symptoms are mostly resolved since that time with the exception of some unsteadiness. Pt denies any head trauma or MVA. PMH includes prostate cancer x 6 years for which he has been treated and has resulted in some weakness. Next Friday he will be having botox injections for his chronic torticollis. Prior history of low back surgery. Pt reports that ~1 year ago he had an episode of "haziness" during church, "  then everything turned gray" and pt could not stand up. That cleared on it's own after going home and resting. Pt reports some "thickness" feeling on the underside of his feet.     Limitations  Walking    Diagnostic tests  MRI negative for acute changes and carotid ultrasound showed less than 50% stenosis of the internal carotid arteries bilaterally.    Patient Stated Goals  Improve balance    Currently in Pain?  No/denies       TREATMENT  Ther-ex Octane HIIT L3 2 min warm-up, L7 x 15s, L3 x 45s for 5 minutes with 1 minute cool down during history. Fatigue monitored (2 minutes unbilled); Quantum leg press 110# 1x10; 170# 1 x 20; 1x10; Standing marches with5# ankle weights (AW) x 15 bilateral; Standing hip extension with5# AW x 15  bilateral; Butterfly Adductor stretch 4x30 sec bilateral Supine R V-slides x20 Sit to stand without UE support from regular height chair with UE reach x5, MIN A x1 for last 3 reps  Neuromuscular Re-education Standing heel/toe raises x 24each direction no UE support;  SPC training for community walking on "dizzy days" LUE, 2 pt gait, x300 feet  Patient educated on body mechanics, AD use, and balance strategies throughout. Patient articulated understanding and made adjustments quickly and maintained throughout increased repetitions.  PT Short Term Goals - 10/05/18 1126      PT SHORT TERM GOAL #1   Title  Pt will be independent with HEP in order to improve strength and balance in order to decrease fall risk and improve function at home and work.     Time  3    Period  Weeks    Status  Achieved    Target Date  09/22/18        PT Long Term Goals - 10/05/18 1127      PT LONG TERM GOAL #1   Title  Pt will improve DGI by at least 3 points in order to demonstrate clinically significant improvement in balance and decreased risk for falls.    Baseline  09/01/18: 16/24; 10/05/18: 22/24    Time  6    Period  Weeks    Status  Achieved    Target Date  10/13/18      PT LONG TERM GOAL #2   Title  Pt will be able to perform sit to stand without UE support in order to improve functional independence with transfers    Baseline  09/01/18: unable to perform; 10/05/18: patient able to perform from standard chair with UE reach    Time  6    Period  Weeks    Status  Achieved    Target Date  10/13/18      PT LONG TERM GOAL #3   Title  Pt will increase 10MWT by at least 0.13 m/s in order to demonstrate clinically significant improvement in community ambulation.    Baseline  09/01/18: welf-selected: 14.4s = 0.69 m/s, Fastest: 12.7s = 0.79 m/sec; 10/05/18: self-selected: 12.2s= 0.19m/s fastest: 9.2s= 1.03 m/s    Time  6    Period  Weeks    Status  On-going    Target Date  11/03/18      PT LONG  TERM GOAL #4   Title  Patient will demonstrate improved BLE strength and balance as evidenced by his ability to independently perform 5TSTS without UE support or UE reach from a standard height chair without LOB in order to decrease risk of falls at home  and in the community.    Baseline  10/05/2018: UE reach, SBA/CGA for increased repetitions    Time  4    Period  Weeks    Status  New    Target Date  11/03/18      PT LONG TERM GOAL #5   Title  Patient will demonstrate step-through gait pattern independently when descending stairs without UE support to improve mobility in the community and optimize independence.    Baseline  10/05/2018: step-to with LUE support    Time  4    Period  Weeks    Status  New    Target Date  11/03/18            Plan - 10/07/18 1023    Clinical Impression Statement  Patient presents to clinic with improved vision and is motivated to participate in more strengthening activties with today's session. Patient demonstrated increased tolerance to activity as evidenced by his ability to maintain SPMs with increased resistance on Octane seated elliptical. Additionally, patient continues to tolerate increased weight on Quantum leg press with good body mechanics and motor control. Patient provided with [facility] training for increased balance on days with greater dizziness; patient able to achieve 2 point pattern with symmetrical stride length matching his gait pattern without AD. Patient will continue to benefit from skilled therapeutic intervention to address deficits in balance, strength, and mobility in order to optimize his function in the community and decrease risk for falls.    Rehab Potential  Good    Clinical Impairments Affecting Rehab Potential  (+) symptoms have resolved    PT Frequency  2x / week    PT Duration  4 weeks    PT Treatment/Interventions  ADLs/Self Care Home Management;Aquatic Therapy;Canalith Repostioning;Cryotherapy;Electrical  Stimulation;Iontophoresis 4mg /ml Dexamethasone;Moist Heat;Traction;Ultrasound;DME Instruction;Gait training;Stair training;Functional mobility training;Therapeutic activities;Therapeutic exercise;Balance training;Neuromuscular re-education;Cognitive remediation;Patient/family education;Manual techniques;Passive range of motion;Dry needling;Vestibular    PT Next Visit Plan  DVA if focus has improved. Continue to progress strengthening and balance during obstacle negotiation    PT Home Exercise Plan  Sit to stand without UE support, static balance with feet together and horizontal head turns, Heel raises (Ok to add toe raises as desired)    Consulted and Agree with Plan of Care  Patient       Patient will benefit from skilled therapeutic intervention in order to improve the following deficits and impairments:  Abnormal gait, Decreased balance, Decreased strength, Difficulty walking  Visit Diagnosis: Muscle weakness (generalized)  Unsteadiness on feet     Problem List Patient Active Problem List   Diagnosis Date Noted  . Dizziness 08/22/2018  . Gait instability 10/27/2016  . Essential hypertension 07/25/2014  . Atherosclerosis of abdominal aorta ([place]) 05/19/2013  . Abnormal EKG 08/09/2012  . Prostate cancer [facility]) 08/09/2012   Myles Gip PT, DPT [phone] 10/07/2018, 10:28 AM  [place] MAIN [facility] SERVICES [address] [place], [place], [address] Phone: [phone]   Fax:  [phone]  Name: Stuart Smith MRN: [id number] Date of Birth: Dec 21, 1926

## 2018-10-08 ENCOUNTER — Ambulatory Visit: Payer: Medicare Other

## 2018-10-12 ENCOUNTER — Ambulatory Visit: Payer: Medicare Other

## 2018-10-12 DIAGNOSIS — M6281 Muscle weakness (generalized): Secondary | ICD-10-CM

## 2018-10-12 DIAGNOSIS — R2681 Unsteadiness on feet: Secondary | ICD-10-CM | POA: Diagnosis not present

## 2018-10-12 NOTE — Therapy (Signed)
[place] MAIN [facility] SERVICES [address] [place], [place], [address] Phone: [phone]   Fax:  [phone]  Physical Therapy Treatment  Patient Details  Name: Stuart Smith MRN: [id number] Date of Birth: 04/07/1927 Referring Provider (PT): Dr. Kary Kos   Encounter Date: 10/12/2018  PT End of Session - 10/12/18 1111    Visit Number  10    Number of Visits  16    Date for PT Re-Evaluation  11/03/18    Authorization Type  Progress note 2/10, Last goals: 10/05/2018    PT Start Time  1108    PT Stop Time  1155    PT Time Calculation (min)  47 min    Equipment Utilized During Treatment  Gait belt    Activity Tolerance  Patient tolerated treatment well    Behavior During Therapy  [facility] for tasks assessed/performed       Past Medical History:  Diagnosis Date  . Cataracts, bilateral   . Depression   . Elevated PSA   . H/O seasonal allergies   . History of chickenpox   . Hypertension   . Prostate cancer [facility]) 2013   Surgery   . Torticollis     Past Surgical History:  Procedure Laterality Date  . BACK SURGERY  1995  . GALLBLADDER SURGERY    . PROSTATE SURGERY  2013    There were no vitals filed for this visit.  Subjective Assessment - 10/12/18 1110    Subjective  Patient reports that his right eye continues to improve. Denies any pain today. No significant health changes since last appointment. No specific questions or concerns at this time.     Patient is accompained by:  Family member   Wife who remained in the waiting room   Pertinent History  Pt reports that he was washing his head in the sink on 08/19/18 and had a severe bout of dizziness. He reports feeling "totally disoriented." Unclear if he had any vertigo. He states that after he brought his head back up the dizziness resolved. Then the following Sunday he went to stand up during communion at church and got very dizzy. He came to the [facility] ER and was worked up for a CVA. MRI  negative for acute changes and carotid ultrasound showed less than 50% stenosis of the internal carotid arteries bilaterally. Pt was kept overnight for observation and his dizziness persisted into the morning. Pt states that he was seen by physical therapist at the hospital who "threw me around" and it "helped a little bit." Pt was also treated with oral prednisone and meclizine and his symptoms improved. Pt reports that his symptoms are mostly resolved since that time with the exception of some unsteadiness. Pt denies any head trauma or MVA. PMH includes prostate cancer x 6 years for which he has been treated and has resulted in some weakness. Next Friday he will be having botox injections for his chronic torticollis. Prior history of low back surgery. Pt reports that ~1 year ago he had an episode of "haziness" during church, "then everything turned gray" and pt could not stand up. That cleared on it's own after going home and resting. Pt reports some "thickness" feeling on the underside of his feet.     Limitations  Walking    Diagnostic tests  MRI negative for acute changes and carotid ultrasound showed less than 50% stenosis of the internal carotid arteries bilaterally.    Patient Stated Goals  Improve balance  Currently in Pain?  No/denies            TREATMENT   Ther-ex Octane HIIT L6 x 30s, L3 x 30s for 5 minutes with 1 minute cool down during history. Fatigue monitored (2 minutes unbilled); Quantum leg press 180# x 20, 195# x 20 Quantum heel raises 120# x 20, x 25; Sit to stand without UE support from regular height chair with 4# medicine ball x 10, very minimal assistance required during second half of set for anterior weight shifting.   Neuromuscular Re-education Standing heel/toe raises x 15each direction; Airex toe taps to cone alternating LE x 10 each side; Airex staggered stance balance alternating forward LEwith ball pass around body performinghorizontal head turns  x10 bouts to each side; Airex feet together balance with eyes closed 30s x 2;   Pt educated throughout session about proper posture and technique with exercises. Improved exercise technique, movement at target joints, use of target muscles after min to mod verbal, visual, tactile cues.   Pt was able to perform sit to stand today from regular height chair with added 4# medicine ball to increase challenge. He is also able to continue increasing resistance with leg press today. Balance exercises included today which are challenging for patient especially on uneven surface. Will continue to progress HEP as appropriate in future sessions as well as advancing strengthening and balance exercises.Pt will benefit from PT services to address deficits in strength, balance, and mobility in order to return to full function at home.                     PT Short Term Goals - 10/05/18 1126      PT SHORT TERM GOAL #1   Title  Pt will be independent with HEP in order to improve strength and balance in order to decrease fall risk and improve function at home and work.     Time  3    Period  Weeks    Status  Achieved    Target Date  09/22/18        PT Long Term Goals - 10/05/18 1127      PT LONG TERM GOAL #1   Title  Pt will improve DGI by at least 3 points in order to demonstrate clinically significant improvement in balance and decreased risk for falls.    Baseline  09/01/18: 16/24; 10/05/18: 22/24    Time  6    Period  Weeks    Status  Achieved    Target Date  10/13/18      PT LONG TERM GOAL #2   Title  Pt will be able to perform sit to stand without UE support in order to improve functional independence with transfers    Baseline  09/01/18: unable to perform; 10/05/18: patient able to perform from standard chair with UE reach    Time  6    Period  Weeks    Status  Achieved    Target Date  10/13/18      PT LONG TERM GOAL #3   Title  Pt will increase 10MWT by at  least 0.13 m/s in order to demonstrate clinically significant improvement in community ambulation.    Baseline  09/01/18: welf-selected: 14.4s = 0.69 m/s, Fastest: 12.7s = 0.79 m/sec; 10/05/18: self-selected: 12.2s= 0.57m/s fastest: 9.2s= 1.03 m/s    Time  6    Period  Weeks    Status  On-going    Target Date  11/03/18  PT LONG TERM GOAL #4   Title  Patient will demonstrate improved BLE strength and balance as evidenced by his ability to independently perform 5TSTS without UE support or UE reach from a standard height chair without LOB in order to decrease risk of falls at home and in the community.    Baseline  10/05/2018: UE reach, SBA/CGA for increased repetitions    Time  4    Period  Weeks    Status  New    Target Date  11/03/18      PT LONG TERM GOAL #5   Title  Patient will demonstrate step-through gait pattern independently when descending stairs without UE support to improve mobility in the community and optimize independence.    Baseline  10/05/2018: step-to with LUE support    Time  4    Period  Weeks    Status  New    Target Date  11/03/18            Plan - 10/12/18 1111    Clinical Impression Statement  Pt was able to perform sit to stand today from regular height chair with added 4# medicine ball to increase challenge. He is also able to continue increasing resistance with leg press today. Balance exercises included today which are challenging for patient especially on uneven surface. Will continue to progress HEP as appropriate in future sessions as well as advancing strengthening and balance exercises.Pt will benefit from PT services to address deficits in strength, balance, and mobility in order to return to full function at home.    Rehab Potential  Good    Clinical Impairments Affecting Rehab Potential  (+) symptoms have resolved    PT Frequency  2x / week    PT Duration  4 weeks    PT Treatment/Interventions  ADLs/Self Care Home Management;Aquatic  Therapy;Canalith Repostioning;Cryotherapy;Electrical Stimulation;Iontophoresis 4mg /ml Dexamethasone;Moist Heat;Traction;Ultrasound;DME Instruction;Gait training;Stair training;Functional mobility training;Therapeutic activities;Therapeutic exercise;Balance training;Neuromuscular re-education;Cognitive remediation;Patient/family education;Manual techniques;Passive range of motion;Dry needling;Vestibular    PT Next Visit Plan  Continue to progress strengthening and balance during obstacle negotiation    PT Home Exercise Plan  Sit to stand without UE support, static balance with feet together and horizontal head turns, Heel raises (Ok to add toe raises as desired)    Consulted and Agree with Plan of Care  Patient       Patient will benefit from skilled therapeutic intervention in order to improve the following deficits and impairments:  Abnormal gait, Decreased balance, Decreased strength, Difficulty walking  Visit Diagnosis: Muscle weakness (generalized)  Unsteadiness on feet     Problem List Patient Active Problem List   Diagnosis Date Noted  . Dizziness 08/22/2018  . Gait instability 10/27/2016  . Essential hypertension 07/25/2014  . Atherosclerosis of abdominal aorta ([place]) 05/19/2013  . Abnormal EKG 08/09/2012  . Prostate cancer ([place]) 08/09/2012   Phillips Grout PT, DPT, GCS  Ilhan Debenedetto 10/12/2018, 6:03 PM  [place] MAIN [facility] SERVICES [address] [place], [place], [address] Phone: [phone]   Fax:  [phone]  Name: Stuart Smith MRN: [id number] Date of Birth: April 20, 1927

## 2018-10-13 ENCOUNTER — Ambulatory Visit: Payer: Medicare Other

## 2018-10-13 DIAGNOSIS — M6281 Muscle weakness (generalized): Secondary | ICD-10-CM

## 2018-10-13 DIAGNOSIS — R2681 Unsteadiness on feet: Secondary | ICD-10-CM | POA: Diagnosis not present

## 2018-10-13 NOTE — Therapy (Signed)
[place] MAIN [facility] SERVICES [address] [place], [place], [address] Phone: [phone]   Fax:  [phone]  Physical Therapy Treatment  Patient Details  Name: Stuart Smith MRN: [id number] Date of Birth: 1927-04-07 Referring Provider (PT): Dr. Kary Kos   Encounter Date: 10/13/2018  PT End of Session - 10/13/18 1303    Visit Number  11    Number of Visits  16    Date for PT Re-Evaluation  11/03/18    Authorization Type  Progress note 3/10, Last goals: 10/05/2018    PT Start Time  1300    PT Stop Time  1345    PT Time Calculation (min)  45 min    Equipment Utilized During Treatment  Gait belt    Activity Tolerance  Patient tolerated treatment well    Behavior During Therapy  [facility] for tasks assessed/performed       Past Medical History:  Diagnosis Date  . Cataracts, bilateral   . Depression   . Elevated PSA   . H/O seasonal allergies   . History of chickenpox   . Hypertension   . Prostate cancer [facility]) 2013   Surgery   . Torticollis     Past Surgical History:  Procedure Laterality Date  . BACK SURGERY  1995  . GALLBLADDER SURGERY    . PROSTATE SURGERY  2013    There were no vitals filed for this visit.  Subjective Assessment - 10/13/18 1253    Subjective  No changes since yesterday. No pain today and no falls. No specific questions or concerns. Pt reports minimal BLE soreness following exercise yesterday    Patient is accompained by:  Family member   Wife who remained in the waiting room   Pertinent History  Pt reports that he was washing his head in the sink on 08/19/18 and had a severe bout of dizziness. He reports feeling "totally disoriented." Unclear if he had any vertigo. He states that after he brought his head back up the dizziness resolved. Then the following Sunday he went to stand up during communion at church and got very dizzy. He came to the [facility] ER and was worked up for a CVA. MRI negative for acute changes and  carotid ultrasound showed less than 50% stenosis of the internal carotid arteries bilaterally. Pt was kept overnight for observation and his dizziness persisted into the morning. Pt states that he was seen by physical therapist at the hospital who "threw me around" and it "helped a little bit." Pt was also treated with oral prednisone and meclizine and his symptoms improved. Pt reports that his symptoms are mostly resolved since that time with the exception of some unsteadiness. Pt denies any head trauma or MVA. PMH includes prostate cancer x 6 years for which he has been treated and has resulted in some weakness. Next Friday he will be having botox injections for his chronic torticollis. Prior history of low back surgery. Pt reports that ~1 year ago he had an episode of "haziness" during church, "then everything turned gray" and pt could not stand up. That cleared on it's own after going home and resting. Pt reports some "thickness" feeling on the underside of his feet.     Limitations  Walking    Diagnostic tests  MRI negative for acute changes and carotid ultrasound showed less than 50% stenosis of the internal carotid arteries bilaterally.    Patient Stated Goals  Improve balance    Currently in Pain?  No/denies        TREATMENT  Ther-ex Octane HIIT L8 x 30s, L4 x 30s for 5 minutes with 1 minute cool down during history. Fatigue monitored(37minutes unbilled); Sit to stand without UE support from regular height chair with Airex pad under feet x 5 with minA+1 assist for all repetitions, repeated without Airex x 5;  Neuromuscular Re-education Standing heel/toe raises x 15each direction; 1/2 foam roll A/P balance on round side up x 30s and then round side down x 30s; 1/2 foam roll tandem balance alternating forward LE x 10 each; Step-ups from Airex pad to 6" step with Airex on top alternating LE x 10 each; Ambulation in hallway with horizontal ball pass to therapist x 75' each  direction; Ambulation in hallway with horizontal ball toss to therapist x 75' each direction; Pball seated balance with NBOS and external perturbations x 60s; Pball seated marches alternating LE x 15 each side; Pball seated marches with opposite arm lift x 10 each side;   Pt educated throughout session about proper posture and technique with exercises. Improved exercise technique, movement at target joints, use of target muscles after min to mod verbal, visual, tactile cues.   Session focused on balance training given extensive strengthening yesterday. He demonstrates difficulty with ambulation while performing head turns. Will continue to progress HEP as appropriate in future sessions as well as advancing strengthening and balance exercises.Pt will benefit from PT services to address deficits in strength, balance, and mobility in order to return to full function at home.                        PT Short Term Goals - 10/05/18 1126      PT SHORT TERM GOAL #1   Title  Pt will be independent with HEP in order to improve strength and balance in order to decrease fall risk and improve function at home and work.     Time  3    Period  Weeks    Status  Achieved    Target Date  09/22/18        PT Long Term Goals - 10/05/18 1127      PT LONG TERM GOAL #1   Title  Pt will improve DGI by at least 3 points in order to demonstrate clinically significant improvement in balance and decreased risk for falls.    Baseline  09/01/18: 16/24; 10/05/18: 22/24    Time  6    Period  Weeks    Status  Achieved    Target Date  10/13/18      PT LONG TERM GOAL #2   Title  Pt will be able to perform sit to stand without UE support in order to improve functional independence with transfers    Baseline  09/01/18: unable to perform; 10/05/18: patient able to perform from standard chair with UE reach    Time  6    Period  Weeks    Status  Achieved    Target Date  10/13/18      PT  LONG TERM GOAL #3   Title  Pt will increase 10MWT by at least 0.13 m/s in order to demonstrate clinically significant improvement in community ambulation.    Baseline  09/01/18: welf-selected: 14.4s = 0.69 m/s, Fastest: 12.7s = 0.79 m/sec; 10/05/18: self-selected: 12.2s= 0.82m/s fastest: 9.2s= 1.03 m/s    Time  6    Period  Weeks    Status  On-going  Target Date  11/03/18      PT LONG TERM GOAL #4   Title  Patient will demonstrate improved BLE strength and balance as evidenced by his ability to independently perform 5TSTS without UE support or UE reach from a standard height chair without LOB in order to decrease risk of falls at home and in the community.    Baseline  10/05/2018: UE reach, SBA/CGA for increased repetitions    Time  4    Period  Weeks    Status  New    Target Date  11/03/18      PT LONG TERM GOAL #5   Title  Patient will demonstrate step-through gait pattern independently when descending stairs without UE support to improve mobility in the community and optimize independence.    Baseline  10/05/2018: step-to with LUE support    Time  4    Period  Weeks    Status  New    Target Date  11/03/18            Plan - 10/13/18 1304    Clinical Impression Statement  Session focused on balance training given extensive strengthening yesterday. He demonstrates difficulty with ambulation while performing head turns. Will continue to progress HEP as appropriate in future sessions as well as advancing strengthening and balance exercises.Pt will benefit from PT services to address deficits in strength, balance, and mobility in order to return to full function at home    Rehab Potential  Good    Clinical Impairments Affecting Rehab Potential  (+) symptoms have resolved    PT Frequency  2x / week    PT Duration  4 weeks    PT Treatment/Interventions  ADLs/Self Care Home Management;Aquatic Therapy;Canalith Repostioning;Cryotherapy;Electrical Stimulation;Iontophoresis 4mg /ml  Dexamethasone;Moist Heat;Traction;Ultrasound;DME Instruction;Gait training;Stair training;Functional mobility training;Therapeutic activities;Therapeutic exercise;Balance training;Neuromuscular re-education;Cognitive remediation;Patient/family education;Manual techniques;Passive range of motion;Dry needling;Vestibular    PT Next Visit Plan  Continue to progress strengthening and balance during obstacle negotiation    PT Home Exercise Plan  Sit to stand without UE support, static balance with feet together and horizontal head turns, Heel raises (Ok to add toe raises as desired)    Consulted and Agree with Plan of Care  Patient       Patient will benefit from skilled therapeutic intervention in order to improve the following deficits and impairments:  Abnormal gait, Decreased balance, Decreased strength, Difficulty walking  Visit Diagnosis: Muscle weakness (generalized)  Unsteadiness on feet     Problem List Patient Active Problem List   Diagnosis Date Noted  . Dizziness 08/22/2018  . Gait instability 10/27/2016  . Essential hypertension 07/25/2014  . Atherosclerosis of abdominal aorta ([place]) 05/19/2013  . Abnormal EKG 08/09/2012  . Prostate cancer ([place]) 08/09/2012   Phillips Grout PT, DPT, GCS  Huprich,Jason 10/13/2018, 4:13 PM  [place] MAIN [facility] SERVICES [address] [place], [place], [address] Phone: [phone]   Fax:  [phone]  Name: Stuart Smith MRN: [id number] Date of Birth: [birth year]

## 2018-10-19 ENCOUNTER — Ambulatory Visit: Payer: Medicare Other | Attending: Family Medicine

## 2018-10-19 DIAGNOSIS — M6281 Muscle weakness (generalized): Secondary | ICD-10-CM | POA: Insufficient documentation

## 2018-10-19 DIAGNOSIS — R2681 Unsteadiness on feet: Secondary | ICD-10-CM | POA: Diagnosis present

## 2018-10-19 NOTE — Therapy (Signed)
[place] MAIN [facility] SERVICES [address] [place], [place], [address] Phone: [phone]   Fax:  [phone]  Physical Therapy Treatment  Patient Details  Name: Stuart Smith MRN: [id number] Date of Birth: May 18, 1927 Referring Provider (PT): Dr. Kary Kos   Encounter Date: 10/19/2018  PT End of Session - 10/19/18 1109    Visit Number  12    Number of Visits  16    Date for PT Re-Evaluation  11/03/18    Authorization Type  Progress note 4/10, Last goals: 10/05/2018    PT Start Time  1107    PT Stop Time  1150    PT Time Calculation (min)  43 min    Equipment Utilized During Treatment  Gait belt    Activity Tolerance  Patient tolerated treatment well    Behavior During Therapy  [facility] for tasks assessed/performed       Past Medical History:  Diagnosis Date  . Cataracts, bilateral   . Depression   . Elevated PSA   . H/O seasonal allergies   . History of chickenpox   . Hypertension   . Prostate cancer [facility]) 2013   Surgery   . Torticollis     Past Surgical History:  Procedure Laterality Date  . BACK SURGERY  1995  . GALLBLADDER SURGERY    . PROSTATE SURGERY  2013    There were no vitals filed for this visit.  Subjective Assessment - 10/19/18 1109    Subjective  Pt states that he is doing well today. His vision continues to improve. No pain today and no falls. No specific questions or concerns. Pt believes that he is making progress with therapy.     Patient is accompained by:  Family member   Wife who remained in the waiting room   Pertinent History  Pt reports that he was washing his head in the sink on 08/19/18 and had a severe bout of dizziness. He reports feeling "totally disoriented." Unclear if he had any vertigo. He states that after he brought his head back up the dizziness resolved. Then the following Sunday he went to stand up during communion at church and got very dizzy. He came to the [facility] ER and was worked up for a  CVA. MRI negative for acute changes and carotid ultrasound showed less than 50% stenosis of the internal carotid arteries bilaterally. Pt was kept overnight for observation and his dizziness persisted into the morning. Pt states that he was seen by physical therapist at the hospital who "threw me around" and it "helped a little bit." Pt was also treated with oral prednisone and meclizine and his symptoms improved. Pt reports that his symptoms are mostly resolved since that time with the exception of some unsteadiness. Pt denies any head trauma or MVA. PMH includes prostate cancer x 6 years for which he has been treated and has resulted in some weakness. Next Friday he will be having botox injections for his chronic torticollis. Prior history of low back surgery. Pt reports that ~1 year ago he had an episode of "haziness" during church, "then everything turned gray" and pt could not stand up. That cleared on it's own after going home and resting. Pt reports some "thickness" feeling on the underside of his feet.     Limitations  Walking    Diagnostic tests  MRI negative for acute changes and carotid ultrasound showed less than 50% stenosis of the internal carotid arteries bilaterally.    Patient  Stated Goals  Improve balance    Currently in Pain?  No/denies           TREATMENT   Ther-ex Octane HIIT L10x 30s, L5x 30s for 5 minutes with 1 minute cool down during history. Fatigue monitored(61minutes unbilled); Quantum leg press 200# x 20, 210# x 20, Forward step-ups to 6" step alternating LE x 10 each; Lateral step-ups to 6" step x 10 each direction; Heel raises off edge of step 2 x 20; Sit to stand without UE support from regular height chair with 6# medicine ball x 5 and then an addition 5 without medicine ball but performing as fast as possible to improve power, cues for increased anterior weight shifting.   Neuromuscular Re-education 1/2 foam roll A/P balance on round side up x  30s; 1/2 foam roll A/P heel/toe rocking x 10 each direction; 1/2 foam roll tandem balance alternating forward LE x 30s each; Cone taps alternating LE x 10 each; Airex cone taps alternating LE x 10 each; Forward ambulation in hallway with horizontal ball pass to therapist x 75' each direction; Retro ambulation in hallway with horizontal ball toss to therapist x 75' each direction;   Pt educated throughout session about proper posture and technique with exercises. Improved exercise technique, movement at target joints, use of target muscles after min to mod verbal, visual, tactile cues.   Pt continues to be able to increase resistance with leg press today. Head turns during ambulation are limited but improving from last session. Pt also demonstrates improving single leg balance. Will continue to progress HEP as appropriate in future sessions as well as advancing strengthening and balance exercises.Pt will benefit from PT services to address deficits in strength, balance, and mobility in order to return to full function at home.                      PT Short Term Goals - 10/05/18 1126      PT SHORT TERM GOAL #1   Title  Pt will be independent with HEP in order to improve strength and balance in order to decrease fall risk and improve function at home and work.     Time  3    Period  Weeks    Status  Achieved    Target Date  09/22/18        PT Long Term Goals - 10/05/18 1127      PT LONG TERM GOAL #1   Title  Pt will improve DGI by at least 3 points in order to demonstrate clinically significant improvement in balance and decreased risk for falls.    Baseline  09/01/18: 16/24; 10/05/18: 22/24    Time  6    Period  Weeks    Status  Achieved    Target Date  10/13/18      PT LONG TERM GOAL #2   Title  Pt will be able to perform sit to stand without UE support in order to improve functional independence with transfers    Baseline  09/01/18: unable to perform;  10/05/18: patient able to perform from standard chair with UE reach    Time  6    Period  Weeks    Status  Achieved    Target Date  10/13/18      PT LONG TERM GOAL #3   Title  Pt will increase 10MWT by at least 0.13 m/s in order to demonstrate clinically significant improvement in community ambulation.  Baseline  09/01/18: welf-selected: 14.4s = 0.69 m/s, Fastest: 12.7s = 0.79 m/sec; 10/05/18: self-selected: 12.2s= 0.102m/s fastest: 9.2s= 1.03 m/s    Time  6    Period  Weeks    Status  On-going    Target Date  11/03/18      PT LONG TERM GOAL #4   Title  Patient will demonstrate improved BLE strength and balance as evidenced by his ability to independently perform 5TSTS without UE support or UE reach from a standard height chair without LOB in order to decrease risk of falls at home and in the community.    Baseline  10/05/2018: UE reach, SBA/CGA for increased repetitions    Time  4    Period  Weeks    Status  New    Target Date  11/03/18      PT LONG TERM GOAL #5   Title  Patient will demonstrate step-through gait pattern independently when descending stairs without UE support to improve mobility in the community and optimize independence.    Baseline  10/05/2018: step-to with LUE support    Time  4    Period  Weeks    Status  New    Target Date  11/03/18            Plan - 10/19/18 1110    Clinical Impression Statement  Pt continues to be able to increase resistance with leg press today. Head turns during ambulation are limited but improving from last session. Pt also demonstrates improving single leg balance. Will continue to progress HEP as appropriate in future sessions as well as advancing strengthening and balance exercises.Pt will benefit from PT services to address deficits in strength, balance, and mobility in order to return to full function at home.    Rehab Potential  Good    Clinical Impairments Affecting Rehab Potential  (+) symptoms have resolved    PT  Frequency  2x / week    PT Duration  4 weeks    PT Treatment/Interventions  ADLs/Self Care Home Management;Aquatic Therapy;Canalith Repostioning;Cryotherapy;Electrical Stimulation;Iontophoresis 4mg /ml Dexamethasone;Moist Heat;Traction;Ultrasound;DME Instruction;Gait training;Stair training;Functional mobility training;Therapeutic activities;Therapeutic exercise;Balance training;Neuromuscular re-education;Cognitive remediation;Patient/family education;Manual techniques;Passive range of motion;Dry needling;Vestibular    PT Next Visit Plan  Continue to progress strengthening and balance during obstacle negotiation    PT Home Exercise Plan  Sit to stand without UE support, static balance with feet together and horizontal head turns, Heel raises (Ok to add toe raises as desired)    Consulted and Agree with Plan of Care  Patient       Patient will benefit from skilled therapeutic intervention in order to improve the following deficits and impairments:  Abnormal gait, Decreased balance, Decreased strength, Difficulty walking  Visit Diagnosis: Muscle weakness (generalized)  Unsteadiness on feet     Problem List Patient Active Problem List   Diagnosis Date Noted  . Dizziness 08/22/2018  . Gait instability 10/27/2016  . Essential hypertension 07/25/2014  . Atherosclerosis of abdominal aorta ([place]) 05/19/2013  . Abnormal EKG 08/09/2012  . Prostate cancer ([place]) 08/09/2012   Phillips Grout PT, DPT, GCS  Huprich,Jason 10/20/2018, 12:53 PM  [place] MAIN [facility] SERVICES [address] [place], [place], [address] Phone: [phone]   Fax:  [phone]  Name: Stuart Smith MRN: [id number] Date of Birth: [birth year]

## 2018-10-21 ENCOUNTER — Ambulatory Visit: Payer: Medicare Other

## 2018-10-21 DIAGNOSIS — M6281 Muscle weakness (generalized): Secondary | ICD-10-CM

## 2018-10-21 DIAGNOSIS — R2681 Unsteadiness on feet: Secondary | ICD-10-CM

## 2018-10-21 NOTE — Therapy (Signed)
[place] MAIN [facility] SERVICES [address] [place], [place], [address] Phone: [phone]   Fax:  [phone]  Physical Therapy Treatment  Patient Details  Name: Stuart Smith MRN: [id number] Date of Birth: [birth year] Referring Provider (PT): Dr. Kary Kos   Encounter Date: 10/21/2018  PT End of Session - 10/21/18 1021    Visit Number  13    Number of Visits  16    Date for PT Re-Evaluation  11/03/18    Authorization Type  Progress note 5/10, Last goals: 10/05/2018    PT Start Time  1018    PT Stop Time  1100    PT Time Calculation (min)  42 min    Equipment Utilized During Treatment  Gait belt    Activity Tolerance  Patient tolerated treatment well    Behavior During Therapy  [facility] for tasks assessed/performed       Past Medical History:  Diagnosis Date  . Cataracts, bilateral   . Depression   . Elevated PSA   . H/O seasonal allergies   . History of chickenpox   . Hypertension   . Prostate cancer [facility]) 2013   Surgery   . Torticollis     Past Surgical History:  Procedure Laterality Date  . BACK SURGERY  1995  . GALLBLADDER SURGERY    . PROSTATE SURGERY  2013    There were no vitals filed for this visit.  Subjective Assessment - 10/21/18 1021    Subjective  Pt states that he is doing well today. He continues to have frustration with his vision. No pain today and no falls. No specific questions or concerns. Pt continues to report improvement with therapy.      Patient is accompained by:  Family member   Wife who remained in the waiting room   Pertinent History  Pt reports that he was washing his head in the sink on 08/19/18 and had a severe bout of dizziness. He reports feeling "totally disoriented." Unclear if he had any vertigo. He states that after he brought his head back up the dizziness resolved. Then the following Sunday he went to stand up during communion at church and got very dizzy. He came to the [facility] ER and was  worked up for a CVA. MRI negative for acute changes and carotid ultrasound showed less than 50% stenosis of the internal carotid arteries bilaterally. Pt was kept overnight for observation and his dizziness persisted into the morning. Pt states that he was seen by physical therapist at the hospital who "threw me around" and it "helped a little bit." Pt was also treated with oral prednisone and meclizine and his symptoms improved. Pt reports that his symptoms are mostly resolved since that time with the exception of some unsteadiness. Pt denies any head trauma or MVA. PMH includes prostate cancer x 6 years for which he has been treated and has resulted in some weakness. Next Friday he will be having botox injections for his chronic torticollis. Prior history of low back surgery. Pt reports that ~1 year ago he had an episode of "haziness" during church, "then everything turned gray" and pt could not stand up. That cleared on it's own after going home and resting. Pt reports some "thickness" feeling on the underside of his feet.     Limitations  Walking    Diagnostic tests  MRI negative for acute changes and carotid ultrasound showed less than 50% stenosis of the internal carotid arteries bilaterally.  Patient Stated Goals  Improve balance    Currently in Pain?  No/denies        TREATMENT   Ther-ex Octane HIIT L10x 30s, L5x 30s for 5 minutes with 1 minute cool down during history. Fatigue monitored(3minutes unbilled); Wellness Zone leg press 175# x 20, 195# x 20, 215# x 20; Wellness Zone leg press jumps 33# 2 x 15, cues for sequencing, improved motor control with added repetitions; Forward step-ups to 6" step with Airex pad on top alternating LE x 10 each; Sit to stand without UE support from regular height chair performing as fast as possible to improve power, cues for increased anterior weight shifting x 10;   Neuromuscular Re-education Balance on 2 dynadisc with dynamic reaching to  targets on mirror as therapist calls out; 1/2 foam roll A/P balance on flat side up x 30s; 1/2 foam roll A/P heel/toe rocking flat side up x 10 each direction; 1/2 foam roll tandem balance alternating forward LE x 30s each; Tandem gait on 2"x4", 4 lengths without UE support; Sidestepping t on 2"x4", 4 lengths without UE support;   Pt educated throughout session about proper posture and technique with exercises. Improved exercise technique, movement at target joints, use of target muscles after min to mod verbal, visual, tactile cues.   Pt continues to be able to increase resistance with leg press today. Attempted jumping on leg press in the Wellness Zone which improves with repetition. Pt also demonstrates improving single leg balance.Will continue to progress HEP as appropriate in future sessions as well as advancing strengthening and balance exercises.Pt will benefit from PT services to address deficits in strength, balance, and mobility in order to return to full function at home.                          PT Short Term Goals - 10/05/18 1126      PT SHORT TERM GOAL #1   Title  Pt will be independent with HEP in order to improve strength and balance in order to decrease fall risk and improve function at home and work.     Time  3    Period  Weeks    Status  Achieved    Target Date  09/22/18        PT Long Term Goals - 10/05/18 1127      PT LONG TERM GOAL #1   Title  Pt will improve DGI by at least 3 points in order to demonstrate clinically significant improvement in balance and decreased risk for falls.    Baseline  09/01/18: 16/24; 10/05/18: 22/24    Time  6    Period  Weeks    Status  Achieved    Target Date  10/13/18      PT LONG TERM GOAL #2   Title  Pt will be able to perform sit to stand without UE support in order to improve functional independence with transfers    Baseline  09/01/18: unable to perform; 10/05/18: patient able to perform  from standard chair with UE reach    Time  6    Period  Weeks    Status  Achieved    Target Date  10/13/18      PT LONG TERM GOAL #3   Title  Pt will increase 10MWT by at least 0.13 m/s in order to demonstrate clinically significant improvement in community ambulation.    Baseline  09/01/18: welf-selected: 14.4s = 0.69  m/s, Fastest: 12.7s = 0.79 m/sec; 10/05/18: self-selected: 12.2s= 0.55m/s fastest: 9.2s= 1.03 m/s    Time  6    Period  Weeks    Status  On-going    Target Date  11/03/18      PT LONG TERM GOAL #4   Title  Patient will demonstrate improved BLE strength and balance as evidenced by his ability to independently perform 5TSTS without UE support or UE reach from a standard height chair without LOB in order to decrease risk of falls at home and in the community.    Baseline  10/05/2018: UE reach, SBA/CGA for increased repetitions    Time  4    Period  Weeks    Status  New    Target Date  11/03/18      PT LONG TERM GOAL #5   Title  Patient will demonstrate step-through gait pattern independently when descending stairs without UE support to improve mobility in the community and optimize independence.    Baseline  10/05/2018: step-to with LUE support    Time  4    Period  Weeks    Status  New    Target Date  11/03/18            Plan - 10/21/18 1022    Clinical Impression Statement  Tandem gait on 2"x4", 4 lengths without UE support;    Rehab Potential  Good    Clinical Impairments Affecting Rehab Potential  (+) symptoms have resolved    PT Frequency  2x / week    PT Duration  4 weeks    PT Treatment/Interventions  ADLs/Self Care Home Management;Aquatic Therapy;Canalith Repostioning;Cryotherapy;Electrical Stimulation;Iontophoresis 4mg /ml Dexamethasone;Moist Heat;Traction;Ultrasound;DME Instruction;Gait training;Stair training;Functional mobility training;Therapeutic activities;Therapeutic exercise;Balance training;Neuromuscular re-education;Cognitive  remediation;Patient/family education;Manual techniques;Passive range of motion;Dry needling;Vestibular    PT Next Visit Plan  Continue to progress strengthening and balance during obstacle negotiation    PT Home Exercise Plan  Sit to stand without UE support, static balance with feet together and horizontal head turns, Heel raises (Ok to add toe raises as desired)    Consulted and Agree with Plan of Care  Patient       Patient will benefit from skilled therapeutic intervention in order to improve the following deficits and impairments:  Abnormal gait, Decreased balance, Decreased strength, Difficulty walking  Visit Diagnosis: Muscle weakness (generalized)  Unsteadiness on feet     Problem List Patient Active Problem List   Diagnosis Date Noted  . Dizziness 08/22/2018  . Gait instability 10/27/2016  . Essential hypertension 07/25/2014  . Atherosclerosis of abdominal aorta ([place]) 05/19/2013  . Abnormal EKG 08/09/2012  . Prostate cancer ([place]) 08/09/2012   Phillips Grout PT, DPT, GCS  Janei Scheff 10/22/2018, 12:37 PM  [place] MAIN [facility] SERVICES [address] [place], [place], [address] Phone: [phone]   Fax:  [phone]  Name: Stuart Smith MRN: [id number] Date of Birth: [birth year]

## 2018-10-22 ENCOUNTER — Ambulatory Visit: Payer: Medicare Other

## 2018-10-26 ENCOUNTER — Ambulatory Visit: Payer: Medicare Other

## 2018-10-26 DIAGNOSIS — M6281 Muscle weakness (generalized): Secondary | ICD-10-CM

## 2018-10-26 DIAGNOSIS — R2681 Unsteadiness on feet: Secondary | ICD-10-CM

## 2018-10-26 NOTE — Therapy (Addendum)
[place] MAIN [facility] SERVICES [address] [place], [place], [address] Phone: [phone]   Fax:  [phone]  Physical Therapy Treatment  Patient Details  Name: Stuart Smith MRN: [id number] Date of Birth: March 02, 1927 Referring Provider (PT): Dr. Kary Kos   Encounter Date: 10/26/2018  PT End of Session - 10/26/18 0950    Visit Number  14    Number of Visits  16    Date for PT Re-Evaluation  11/03/18    Authorization Type  Progress note 7/10, Last goals: 10/05/2018    PT Start Time  0846    PT Stop Time  0932    PT Time Calculation (min)  46 min    Equipment Utilized During Treatment  Gait belt    Activity Tolerance  Patient tolerated treatment well    Behavior During Therapy  [facility] for tasks assessed/performed       Past Medical History:  Diagnosis Date  . Cataracts, bilateral   . Depression   . Elevated PSA   . H/O seasonal allergies   . History of chickenpox   . Hypertension   . Prostate cancer [facility]) 2013   Surgery   . Torticollis     Past Surgical History:  Procedure Laterality Date  . BACK SURGERY  1995  . GALLBLADDER SURGERY    . PROSTATE SURGERY  2013    There were no vitals filed for this visit.  Subjective Assessment - 10/26/18 0941    Subjective  Pt reports that he is having a "rough morning" with increased muscle spasms.  He reports no falls or medication changes since last visit.  He stated that he felt "better" following ther ex and that he was glad that he decided to come to therapy instead of cancelling.     Patient is accompained by:  Family member   Wife who remained in the waiting room   Pertinent History  Pt reports that he was washing his head in the sink on 08/19/18 and had a severe bout of dizziness. He reports feeling "totally disoriented." Unclear if he had any vertigo. He states that after he brought his head back up the dizziness resolved. Then the following Sunday he went to stand up during communion  at church and got very dizzy. He came to the [facility] ER and was worked up for a CVA. MRI negative for acute changes and carotid ultrasound showed less than 50% stenosis of the internal carotid arteries bilaterally. Pt was kept overnight for observation and his dizziness persisted into the morning. Pt states that he was seen by physical therapist at the hospital who "threw me around" and it "helped a little bit." Pt was also treated with oral prednisone and meclizine and his symptoms improved. Pt reports that his symptoms are mostly resolved since that time with the exception of some unsteadiness. Pt denies any head trauma or MVA. PMH includes prostate cancer x 6 years for which he has been treated and has resulted in some weakness. Next Friday he will be having botox injections for his chronic torticollis. Prior history of low back surgery. Pt reports that ~1 year ago he had an episode of "haziness" during church, "then everything turned gray" and pt could not stand up. That cleared on it's own after going home and resting. Pt reports some "thickness" feeling on the underside of his feet.     Limitations  Walking    Diagnostic tests  MRI negative for acute changes and carotid  ultrasound showed less than 50% stenosis of the internal carotid arteries bilaterally.    Patient Stated Goals  Improve balance    Currently in Pain?  No/denies      Therapeutic Exercise   Nustep x7 min Level 2, 60-70 SPM  Parallel bars Standing heel raise, toe raise with light bilateral UE support, SBA 2x20  Airex: ft together with head turns x12 CGA with increased a-p sway, close SBA Standing march on Airex x20 without UE support CGA Step ups onto Airex x20 without UE support, close SBA STS 4x without Airex and 6x with Airex in chair without UE support  Standing HS curl x10, hip abduction 2x10 bilaterally with 3 lb ankle weights Standing heel/toe raises x20 each with VC's to avoid ankle  inversion                            PT Short Term Goals - 10/05/18 1126      PT SHORT TERM GOAL #1   Title  Pt will be independent with HEP in order to improve strength and balance in order to decrease fall risk and improve function at home and work.     Time  3    Period  Weeks    Status  Achieved    Target Date  09/22/18        PT Long Term Goals - 10/05/18 1127      PT LONG TERM GOAL #1   Title  Pt will improve DGI by at least 3 points in order to demonstrate clinically significant improvement in balance and decreased risk for falls.    Baseline  09/01/18: 16/24; 10/05/18: 22/24    Time  6    Period  Weeks    Status  Achieved    Target Date  10/13/18      PT LONG TERM GOAL #2   Title  Pt will be able to perform sit to stand without UE support in order to improve functional independence with transfers    Baseline  09/01/18: unable to perform; 10/05/18: patient able to perform from standard chair with UE reach    Time  6    Period  Weeks    Status  Achieved    Target Date  10/13/18      PT LONG TERM GOAL #3   Title  Pt will increase 10MWT by at least 0.13 m/s in order to demonstrate clinically significant improvement in community ambulation.    Baseline  09/01/18: welf-selected: 14.4s = 0.69 m/s, Fastest: 12.7s = 0.79 m/sec; 10/05/18: self-selected: 12.2s= 0.4m/s fastest: 9.2s= 1.03 m/s    Time  6    Period  Weeks    Status  On-going    Target Date  11/03/18      PT LONG TERM GOAL #4   Title  Patient will demonstrate improved BLE strength and balance as evidenced by his ability to independently perform 5TSTS without UE support or UE reach from a standard height chair without LOB in order to decrease risk of falls at home and in the community.    Baseline  10/05/2018: UE reach, SBA/CGA for increased repetitions    Time  4    Period  Weeks    Status  New    Target Date  11/03/18      PT LONG TERM GOAL #5   Title  Patient will demonstrate  step-through gait pattern independently when descending stairs without UE  support to improve mobility in the community and optimize independence.    Baseline  10/05/2018: step-to with LUE support    Time  4    Period  Weeks    Status  New    Target Date  11/03/18            Plan - 10/26/18 0949    Clinical Impression Statement  Patient tolerated all there ex well today, despite reporting that he was having a "rough morning" and reported feeling "better" at end of session.  PT provided education regarding upright posture during standing exercises and working muscles to overload.  He required close SBA to CGA assist for standing balance.  Pt experienced one posterior LOB during STS exercise which he was able to correct with min A from PT.  Pt able to perform new there ex on Airex foam pad without difficulty or report of pain.      Rehab Potential  Good    Clinical Impairments Affecting Rehab Potential  (+) symptoms have resolved    PT Frequency  2x / week    PT Duration  4 weeks    PT Treatment/Interventions  ADLs/Self Care Home Management;Aquatic Therapy;Canalith Repostioning;Cryotherapy;Electrical Stimulation;Iontophoresis 4mg /ml Dexamethasone;Moist Heat;Traction;Ultrasound;DME Instruction;Gait training;Stair training;Functional mobility training;Therapeutic activities;Therapeutic exercise;Balance training;Neuromuscular re-education;Cognitive remediation;Patient/family education;Manual techniques;Passive range of motion;Dry needling;Vestibular    PT Next Visit Plan  Continue to progress strengthening and balance during obstacle negotiation    PT Home Exercise Plan  Sit to stand without UE support, static balance with feet together and horizontal head turns, Heel raises (Ok to add toe raises as desired)    Consulted and Agree with Plan of Care  Patient       Patient will benefit from skilled therapeutic intervention in order to improve the following deficits and impairments:  Abnormal gait,  Decreased balance, Decreased strength, Difficulty walking  Visit Diagnosis: Muscle weakness (generalized)  Unsteadiness on feet     Problem List Patient Active Problem List   Diagnosis Date Noted  . Dizziness 08/22/2018  . Gait instability 10/27/2016  . Essential hypertension 07/25/2014  . Atherosclerosis of abdominal aorta ([place]) 05/19/2013  . Abnormal EKG 08/09/2012  . Prostate cancer ([place]) 08/09/2012    Roxanne Gates, PT, DPT 10/26/2018, 1:17 PM, ADDENDED 10/27/2018  [place] [facility] MAIN [facility] SERVICES [address] [place], [place], [address] Phone: [phone]   Fax:  [phone]  Name: Stuart Smith MRN: [id number] Date of Birth: 11-Oct-1927

## 2018-10-27 ENCOUNTER — Ambulatory Visit: Payer: Medicare Other

## 2018-11-01 ENCOUNTER — Ambulatory Visit: Payer: Medicare Other

## 2018-11-04 ENCOUNTER — Ambulatory Visit: Payer: Medicare Other

## 2018-11-04 DIAGNOSIS — M6281 Muscle weakness (generalized): Secondary | ICD-10-CM

## 2018-11-04 DIAGNOSIS — R2681 Unsteadiness on feet: Secondary | ICD-10-CM

## 2018-11-04 NOTE — Therapy (Signed)
[place] MAIN [facility] SERVICES [address] [place], [place], [address] Phone: [phone]   Fax:  [phone]  Physical Therapy Treatment/Recertification  Dates of reporting period  10/05/18    to   11/04/18  Patient Details  Name: Stuart Smith MRN: [id number] Date of Birth: 1927-02-20 Referring Provider (PT): Dr. Kary Kos   Encounter Date: 11/04/2018  PT End of Session - 11/04/18 1121    Visit Number  15    Number of Visits  32    Date for PT Re-Evaluation  12/30/18    Authorization Type  Progress note 8/10, next visit is 1/10; Last goals: 11/04/18    PT Start Time  1117    PT Stop Time  1201    PT Time Calculation (min)  44 min    Equipment Utilized During Treatment  Gait belt    Activity Tolerance  Patient tolerated treatment well    Behavior During Therapy  WFL for tasks assessed/performed       Past Medical History:  Diagnosis Date  . Cataracts, bilateral   . Depression   . Elevated PSA   . H/O seasonal allergies   . History of chickenpox   . Hypertension   . Prostate cancer [facility]) 2013   Surgery   . Torticollis     Past Surgical History:  Procedure Laterality Date  . BACK SURGERY  1995  . GALLBLADDER SURGERY    . PROSTATE SURGERY  2013    There were no vitals filed for this visit.  Subjective Assessment - 11/04/18 1121    Subjective  Pt states that he is doing well today. No specific questions or concerns at this time. He denies pain and has not had any falls since the last therapy session.     Patient is accompained by:  Family member   Wife who remained in the waiting room   Pertinent History  Pt reports that he was washing his head in the sink on 08/19/18 and had a severe bout of dizziness. He reports feeling "totally disoriented." Unclear if he had any vertigo. He states that after he brought his head back up the dizziness resolved. Then the following Sunday he went to stand up during communion at church and got  very dizzy. He came to the [facility] ER and was worked up for a CVA. MRI negative for acute changes and carotid ultrasound showed less than 50% stenosis of the internal carotid arteries bilaterally. Pt was kept overnight for observation and his dizziness persisted into the morning. Pt states that he was seen by physical therapist at the hospital who "threw me around" and it "helped a little bit." Pt was also treated with oral prednisone and meclizine and his symptoms improved. Pt reports that his symptoms are mostly resolved since that time with the exception of some unsteadiness. Pt denies any head trauma or MVA. PMH includes prostate cancer x 6 years for which he has been treated and has resulted in some weakness. Next Friday he will be having botox injections for his chronic torticollis. Prior history of low back surgery. Pt reports that ~1 year ago he had an episode of "haziness" during church, "then everything turned gray" and pt could not stand up. That cleared on it's own after going home and resting. Pt reports some "thickness" feeling on the underside of his feet.     Limitations  Walking    Diagnostic tests  MRI negative for acute changes and carotid ultrasound  showed less than 50% stenosis of the internal carotid arteries bilaterally.    Patient Stated Goals  Improve balance    Currently in Pain?  No/denies         [facility] PT Assessment - 11/04/18 1133      Standardized Balance Assessment   Standardized Balance Assessment  Berg Balance Test;Dynamic Gait Index;Timed Up and Go Test;Five Times Sit to Stand;10 meter walk test    Five times sit to stand comments   17.1s without UE support    10 Meter Walk  self-selected: 12.2s= 0.73m/s fastest: 8.1s= 1.23 m/s      Berg Balance Test   Sit to Stand  Able to stand without using hands and stabilize independently    Standing Unsupported  Able to stand safely 2 minutes    Sitting with Back Unsupported but Feet Supported on Floor or Stool  Able to sit  safely and securely 2 minutes    Stand to Sit  Sits safely with minimal use of hands    Transfers  Able to transfer safely, minor use of hands    Standing Unsupported with Eyes Closed  Able to stand 10 seconds safely    Standing Ubsupported with Feet Together  Able to place feet together independently and stand 1 minute safely    From Standing, Reach Forward with Outstretched Arm  Can reach confidently >25 cm (10")    From Standing Position, Pick up Object from Floor  Able to pick up shoe safely and easily    From Standing Position, Turn to Look Behind Over each Shoulder  Looks behind from both sides and weight shifts well    Turn 360 Degrees  Able to turn 360 degrees safely in 4 seconds or less    Standing Unsupported, Alternately Place Feet on Step/Stool  Able to stand independently and safely and complete 8 steps in 20 seconds    Standing Unsupported, One Foot in Front  Able to plae foot ahead of the other independently and hold 30 seconds    Standing on One Leg  Able to lift leg independently and hold 5-10 seconds    Total Score  54      Dynamic Gait Index   Level Surface  Normal    Change in Gait Speed  Normal    Gait with Horizontal Head Turns  Normal    Gait with Vertical Head Turns  Normal    Gait and Pivot Turn  Normal    Step Over Obstacle  Normal    Step Around Obstacles  Normal    Steps  Normal    Total Score  24        TREATMENT   Ther-ex Octane HIIT L11x 30s, L5x 30s for 5 minutes with 1 minute cool down during history. Fatigue monitored(5minutes unbilled); Performed outcome measures with patient including DGI (24/24), 55m gait speed (self-selected: 12.2s= 0.71m/s fastest: 8.1s= 1.23 m/s), 5TSTS (17.1s), and BERG (54/56) Updated goals and discussed plan of care with patient;  Wellness Zone leg press213# x 15, 233# x 15; Wellness Zone leg press jumps 33# 2 x 15, cues for sequencing, improved motor control with added repetitions; Forward step-ups to 6" step  with Airex pad on top from standing on Airex pad alternating LE x 10 each;      Pt educated throughout session about proper posture and technique with exercises. Improved exercise technique, movement at target joints, use of target muscles after min to mod verbal, visual, tactile cues.     Stuart Kitchen  PT Short Term Goals - 11/04/18 1122      PT SHORT TERM GOAL #1   Title  Pt will be independent with HEP in order to improve strength and balance in order to decrease fall risk and improve function at home and work.     Time  3    Period  Weeks    Status  Achieved        PT Long Term Goals - 11/04/18 1122      PT LONG TERM GOAL #1   Title  Pt will improve DGI by at least 3 points in order to demonstrate clinically significant improvement in balance and decreased risk for falls.    Baseline  09/01/18: 16/24; 10/05/18: 22/24; 11/04/18: 24/24    Time  6    Period  Weeks    Status  Achieved      PT LONG TERM GOAL #2   Title  Pt will be able to perform sit to stand without UE support in order to improve functional independence with transfers    Baseline  09/01/18: unable to perform; 10/05/18: patient able to perform from standard chair with UE reach; 11/04/18: 5TSTS in 17.1 seconds    Time  6    Period  Weeks    Status  Achieved      PT LONG TERM GOAL #3   Title  Pt will increase 10MWT by at least 0.13 m/s in order to demonstrate clinically significant improvement in community ambulation.    Baseline  09/01/18: self-selected: 14.4s = 0.69 m/s, Fastest: 12.7s = 0.79 m/sec; 10/05/18: self-selected: 12.2s= 0.13m/s fastest: 9.2s= 1.03 m/s; 11/04/18: self-selected: 12.2s= 0.61m/s fastest: 8.1s= 1.23 m/s    Time  6    Period  Weeks    Status  Achieved      PT LONG TERM GOAL #4   Title  Patient will demonstrate improved BLE strength and balance as evidenced by his ability to independently perform 5TSTS without UE support or UE reach from a standard  height chair without LOB in order to decrease risk of falls at home and in the community.    Baseline  10/05/2018: UE reach, SBA/CGA for increased repetitions; 11/04/18: 17.1s     Time  4    Period  Weeks    Status  Achieved    Target Date  --      PT LONG TERM GOAL #5   Title  Patient will demonstrate step-through gait pattern independently when descending stairs without UE support to improve mobility in the community and optimize independence.    Baseline  10/05/2018: step-to with LUE support; 11/04/18: Able to perform without UE support with reciprocal pattern    Time  4    Period  Weeks    Status  Achieved      Additional Long Term Goals   Additional Long Term Goals  Yes      PT LONG TERM GOAL #6   Title  Pt will decrease 5TSTS by at least 3 seconds in order to demonstrate clinically significant improvement in LE strength     Baseline  09/01/18: unable to perform; 10/05/18: patient able to perform from standard chair with UE reach; 11/04/18: 17.1 seconds    Time  8    Period  Weeks    Status  New    Target Date  12/30/18      PT LONG TERM GOAL #7   Title  Pt will be able to perform single leg balance  for greater than 12 seconds on each leg in order to improve balance and decrease his risk for falls    Baseline  11/04/18: 7-9 seconds on both LEs    Time  8    Period  Weeks    Status  New            Plan - 11/04/18 1122    Clinical Impression Statement  Patient demonstrates very significant improvements in his outcome measures since starting therapy. His strength and balance have improved considerably. At initial evaluation on 09/01/18 pt was unable to stand from a chair without heavy reliance on his arms to push off the arm rests. Today he is able to complete a Five Time Sit to Stand test without his arms in 17.1 seconds. His DGI improved from 16/24 to 24/24 and his BERG improved from a 45/56 initially to a 54/56 today. His self-selected and fastest walking speed have  increased on the 66m gait speed test and he is able to ascend/descend steps with a reciprocal pattern and no railings. His leg press has increased consistently over the course of his therapy. He continues to present with deficits in lower extremity strength as well as single leg balance.  Patient's condition has the potential to improve in response to therapy. Maximum improvement is yet to be obtained. The anticipated improvement is attainable and reasonable in a generally predictable time. Patient will continue to benefit from skilled therapeutic intervention to optimize independence and improve function    Rehab Potential  Good    Clinical Impairments Affecting Rehab Potential  (+) symptoms have resolved    PT Frequency  2x / week    PT Duration  8 weeks    PT Treatment/Interventions  ADLs/Self Care Home Management;Aquatic Therapy;Canalith Repostioning;Cryotherapy;Electrical Stimulation;Iontophoresis 4mg /ml Dexamethasone;Moist Heat;Traction;Ultrasound;DME Instruction;Gait training;Stair training;Functional mobility training;Therapeutic activities;Therapeutic exercise;Balance training;Neuromuscular re-education;Cognitive remediation;Patient/family education;Manual techniques;Passive range of motion;Dry needling;Vestibular    PT Next Visit Plan  Continue to progress strengthening and balance during obstacle negotiation    PT Home Exercise Plan  Sit to stand without UE support, mini squast, tandem and single leg balance;    Consulted and Agree with Plan of Care  Patient       Patient will benefit from skilled therapeutic intervention in order to improve the following deficits and impairments:  Abnormal gait, Decreased balance, Decreased strength, Difficulty walking  Visit Diagnosis: Muscle weakness (generalized)  Unsteadiness on feet     Problem List Patient Active Problem List   Diagnosis Date Noted  . Dizziness 08/22/2018  . Gait instability 10/27/2016  . Essential hypertension 07/25/2014   . Atherosclerosis of abdominal aorta ([place]) 05/19/2013  . Abnormal EKG 08/09/2012  . Prostate cancer ([place]) 08/09/2012   Phillips Grout PT, DPT, GCS  Stuart Smith 11/04/2018, 1:45 PM  [place] MAIN [facility] SERVICES [address] [place], [place], [address] Phone: [phone]   Fax:  [phone]  Name: Stuart Smith MRN: [id number] Date of Birth: [birth year]

## 2018-11-16 ENCOUNTER — Ambulatory Visit: Payer: Medicare Other

## 2018-11-16 DIAGNOSIS — R2681 Unsteadiness on feet: Secondary | ICD-10-CM

## 2018-11-16 DIAGNOSIS — M6281 Muscle weakness (generalized): Secondary | ICD-10-CM

## 2018-11-16 NOTE — Therapy (Signed)
[place] MAIN [facility] SERVICES [address] [place], [place], [address] Phone: [phone]   Fax:  [phone]  Physical Therapy Treatment  Patient Details  Name: Stuart Smith MRN: [id number] Date of Birth: [birth year] Referring Provider (PT): Dr. Kary Kos   Encounter Date: 11/16/2018  PT End of Session - 11/16/18 0948    Visit Number  16    Number of Visits  32    Date for PT Re-Evaluation  12/30/18    Authorization Type  Progress note: 1/10; Last goals: 11/04/18    PT Start Time  0932    PT Stop Time  1015    PT Time Calculation (min)  43 min    Equipment Utilized During Treatment  Gait belt    Activity Tolerance  Patient tolerated treatment well    Behavior During Therapy  WFL for tasks assessed/performed       Past Medical History:  Diagnosis Date  . Cataracts, bilateral   . Depression   . Elevated PSA   . H/O seasonal allergies   . History of chickenpox   . Hypertension   . Prostate cancer [facility]) 2013   Surgery   . Torticollis     Past Surgical History:  Procedure Laterality Date  . BACK SURGERY  1995  . GALLBLADDER SURGERY    . PROSTATE SURGERY  2013    There were no vitals filed for this visit.  Subjective Assessment - 11/16/18 1211    Subjective  Pt states that he is doing well today. No specific questions or concerns at this time. He denies pain and has not had any falls since the last therapy session.     Patient is accompained by:  Family member   Wife who remained in the waiting room   Pertinent History  Pt reports that he was washing his head in the sink on 08/19/18 and had a severe bout of dizziness. He reports feeling "totally disoriented." Unclear if he had any vertigo. He states that after he brought his head back up the dizziness resolved. Then the following Sunday he went to stand up during communion at church and got very dizzy. He came to the [facility] ER and was worked up for a CVA. MRI negative for acute  changes and carotid ultrasound showed less than 50% stenosis of the internal carotid arteries bilaterally. Pt was kept overnight for observation and his dizziness persisted into the morning. Pt states that he was seen by physical therapist at the hospital who "threw me around" and it "helped a little bit." Pt was also treated with oral prednisone and meclizine and his symptoms improved. Pt reports that his symptoms are mostly resolved since that time with the exception of some unsteadiness. Pt denies any head trauma or MVA. PMH includes prostate cancer x 6 years for which he has been treated and has resulted in some weakness. Next Friday he will be having botox injections for his chronic torticollis. Prior history of low back surgery. Pt reports that ~1 year ago he had an episode of "haziness" during church, "then everything turned gray" and pt could not stand up. That cleared on it's own after going home and resting. Pt reports some "thickness" feeling on the underside of his feet.     Limitations  Walking    Diagnostic tests  MRI negative for acute changes and carotid ultrasound showed less than 50% stenosis of the internal carotid arteries bilaterally.    Patient Stated Goals  Improve balance         TREATMENT   Ther-ex Octane HIIT L11x 30s, L5x 30s for 5 minutes with 1 minute cool down during history. Fatigue monitored(2minutes unbilled); Quantum leg press210# x 20, 225# x 15, 230# x 15, pt requires assist for first rep of every set; Forward lunges onto BOSU (round side up) alternating LE x 10 each; Sit to stand without UE support from regular height chair performing as fast as possible to improve power, cues for increased anterior weight shifting x 10;   Neuromuscular Re-education Rockerboard static balance in A/P and R/L directions x 30s each; Rockerboard static balance in A/P and R/L directions with ball passes around body x 10 each; Rockerboard balance in A/P and R/L  directions mini squats x 10 each; Heel/toe raises without UE support x 10 each; Tandem gait in // bars x 6 lengths without UE support; Sidestepping on Airex balance beam x 4 lengths without UE support;   Pt educated throughout session about proper posture and technique with exercises. Improved exercise technique, movement at target joints, use of target muscles after min to mod verbal, visual, tactile cues.   Pt continues to be able to increase resistance with leg press today. Progressed balance exercises to include more challenging unstable surfaces such as a rocker board. Sit to stand continues to improve.Will continue to progress HEP as appropriate in future sessions as well as advancing strengthening and balance exercises.Pt will benefit from PT services to address deficits in strength, balance, and mobility in order to return to full function at home.                        PT Short Term Goals - 11/04/18 1122      PT SHORT TERM GOAL #1   Title  Pt will be independent with HEP in order to improve strength and balance in order to decrease fall risk and improve function at home and work.     Time  3    Period  Weeks    Status  Achieved        PT Long Term Goals - 11/04/18 1122      PT LONG TERM GOAL #1   Title  Pt will improve DGI by at least 3 points in order to demonstrate clinically significant improvement in balance and decreased risk for falls.    Baseline  09/01/18: 16/24; 10/05/18: 22/24; 11/04/18: 24/24    Time  6    Period  Weeks    Status  Achieved      PT LONG TERM GOAL #2   Title  Pt will be able to perform sit to stand without UE support in order to improve functional independence with transfers    Baseline  09/01/18: unable to perform; 10/05/18: patient able to perform from standard chair with UE reach; 11/04/18: 5TSTS in 17.1 seconds    Time  6    Period  Weeks    Status  Achieved      PT LONG TERM GOAL #3   Title  Pt will  increase 10MWT by at least 0.13 m/s in order to demonstrate clinically significant improvement in community ambulation.    Baseline  09/01/18: self-selected: 14.4s = 0.69 m/s, Fastest: 12.7s = 0.79 m/sec; 10/05/18: self-selected: 12.2s= 0.28m/s fastest: 9.2s= 1.03 m/s; 11/04/18: self-selected: 12.2s= 0.64m/s fastest: 8.1s= 1.23 m/s    Time  6    Period  Weeks    Status  Achieved  PT LONG TERM GOAL #4   Title  Patient will demonstrate improved BLE strength and balance as evidenced by his ability to independently perform 5TSTS without UE support or UE reach from a standard height chair without LOB in order to decrease risk of falls at home and in the community.    Baseline  10/05/2018: UE reach, SBA/CGA for increased repetitions; 11/04/18: 17.1s     Time  4    Period  Weeks    Status  Achieved    Target Date  --      PT LONG TERM GOAL #5   Title  Patient will demonstrate step-through gait pattern independently when descending stairs without UE support to improve mobility in the community and optimize independence.    Baseline  10/05/2018: step-to with LUE support; 11/04/18: Able to perform without UE support with reciprocal pattern    Time  4    Period  Weeks    Status  Achieved      Additional Long Term Goals   Additional Long Term Goals  Yes      PT LONG TERM GOAL #6   Title  Pt will decrease 5TSTS by at least 3 seconds in order to demonstrate clinically significant improvement in LE strength     Baseline  09/01/18: unable to perform; 10/05/18: patient able to perform from standard chair with UE reach; 11/04/18: 17.1 seconds    Time  8    Period  Weeks    Status  New    Target Date  12/30/18      PT LONG TERM GOAL #7   Title  Pt will be able to perform single leg balance for greater than 12 seconds on each leg in order to improve balance and decrease his risk for falls    Baseline  11/04/18: 7-9 seconds on both LEs    Time  [place] - 11/16/18 0948    Clinical Impression Statement  Pt continues to be able to increase resistance with leg press today. Progressed balance exercises to include more challenging unstable surfaces such as a rocker board. Sit to stand continues to improve.Will continue to progress HEP as appropriate in future sessions as well as advancing strengthening and balance exercises.Pt will benefit from PT services to address deficits in strength, balance, and mobility in order to return to full function at home.    Rehab Potential  Good    Clinical Impairments Affecting Rehab Potential  (+) symptoms have resolved    PT Frequency  2x / week    PT Duration  8 weeks    PT Treatment/Interventions  ADLs/Self Care Home Management;Aquatic Therapy;Canalith Repostioning;Cryotherapy;Electrical Stimulation;Iontophoresis 4mg /ml Dexamethasone;Moist Heat;Traction;Ultrasound;DME Instruction;Gait training;Stair training;Functional mobility training;Therapeutic activities;Therapeutic exercise;Balance training;Neuromuscular re-education;Cognitive remediation;Patient/family education;Manual techniques;Passive range of motion;Dry needling;Vestibular    PT Next Visit Plan  Continue to progress strengthening and balance    PT Home Exercise Plan  Sit to stand without UE support, mini squast, tandem and single leg balance;    Consulted and Agree with Plan of Care  Patient       Patient will benefit from skilled therapeutic intervention in order to improve the following deficits and impairments:  Abnormal gait, Decreased balance, Decreased strength, Difficulty walking  Visit Diagnosis: Muscle weakness (generalized)  Unsteadiness on feet     Problem List Patient Active Problem List   Diagnosis  Date Noted  . Dizziness 08/22/2018  . Gait instability 10/27/2016  . Essential hypertension 07/25/2014  . Atherosclerosis of abdominal aorta ([place]) 05/19/2013  . Abnormal EKG 08/09/2012  . Prostate cancer  ([place]) 08/09/2012   Phillips Grout PT, DPT, GCS  Camil Hausmann 11/16/2018, 1:21 PM  [place] MAIN [facility] SERVICES [address] [place], [place], [address] Phone: [phone]   Fax:  [phone]  Name: Stuart Smith MRN: [id number] Date of Birth: Mar 22, 1927

## 2018-11-18 ENCOUNTER — Encounter: Payer: Self-pay | Admitting: Physical Therapy

## 2018-11-18 ENCOUNTER — Ambulatory Visit: Payer: Medicare Other | Attending: Family Medicine | Admitting: Physical Therapy

## 2018-11-18 DIAGNOSIS — M6281 Muscle weakness (generalized): Secondary | ICD-10-CM | POA: Diagnosis not present

## 2018-11-18 DIAGNOSIS — R2681 Unsteadiness on feet: Secondary | ICD-10-CM | POA: Diagnosis present

## 2018-11-18 NOTE — Therapy (Signed)
[place] MAIN [facility] SERVICES [address] [place], [place], [address] Phone: [phone]   Fax:  [phone]  Physical Therapy Treatment  Patient Details  Name: Stuart Smith MRN: [id number] Date of Birth: [birth year] Referring Provider (PT): Dr. Kary Kos   Encounter Date: 11/18/2018  PT End of Session - 11/18/18 1024    Visit Number  17    Number of Visits  32    Date for PT Re-Evaluation  12/30/18    Authorization Type  Progress note: 2/10; Last goals: 11/04/18    PT Start Time  1026    PT Stop Time  1111    PT Time Calculation (min)  45 min    Equipment Utilized During Treatment  Gait belt    Activity Tolerance  Patient tolerated treatment well    Behavior During Therapy  WFL for tasks assessed/performed       Past Medical History:  Diagnosis Date  . Cataracts, bilateral   . Depression   . Elevated PSA   . H/O seasonal allergies   . History of chickenpox   . Hypertension   . Prostate cancer [facility]) 2013   Surgery   . Torticollis     Past Surgical History:  Procedure Laterality Date  . BACK SURGERY  1995  . GALLBLADDER SURGERY    . PROSTATE SURGERY  2013    There were no vitals filed for this visit.  Subjective Assessment - 11/18/18 1031    Subjective  Patient states that he is doing fine today. He denies any pain and no falls. He reports feeling a bit stiff.     Patient is accompained by:  --   Wife who remained in the waiting room   Pertinent History  Pt reports that he was washing his head in the sink on 08/19/18 and had a severe bout of dizziness. He reports feeling "totally disoriented." Unclear if he had any vertigo. He states that after he brought his head back up the dizziness resolved. Then the following Sunday he went to stand up during communion at church and got very dizzy. He came to the [place] ER and was worked up for a CVA. MRI negative for acute changes and carotid ultrasound showed less than 50% stenosis of the  internal carotid arteries bilaterally. Pt was kept overnight for observation and his dizziness persisted into the morning. Pt states that he was seen by physical therapist at the hospital who "threw me around" and it "helped a little bit." Pt was also treated with oral prednisone and meclizine and his symptoms improved. Pt reports that his symptoms are mostly resolved since that time with the exception of some unsteadiness. Pt denies any head trauma or MVA. PMH includes prostate cancer x 6 years for which he has been treated and has resulted in some weakness. Next Friday he will be having botox injections for his chronic torticollis. Prior history of low back surgery. Pt reports that ~1 year ago he had an episode of "haziness" during church, "then everything turned gray" and pt could not stand up. That cleared on it's own after going home and resting. Pt reports some "thickness" feeling on the underside of his feet.     Limitations  Walking    Diagnostic tests  MRI negative for acute changes and carotid ultrasound showed less than 50% stenosis of the internal carotid arteries bilaterally.    Patient Stated Goals  Improve balance    Currently in Pain?  No/denies  TREATMENT   Ther-ex Octane HIIT L11x 45s, L5x 60s for 4 minutes with 1 minute cool down during history. Fatigue monitored Quantumleg press 150# x10210# x 20, 225# x 15, 230# x 15, pt requires assist for first rep of every set; Quantum leg press 75# RLE x15; LLE x20 Sit to stand without UE support from regular height chair, cues for increased anterior weight shiftingx 7; x3 with tactile cues/min A for hip extension and verbal cues for foot position  Patient required increased rest breaks during exercise today due to fatigue in legs.  Neuromuscular Re-education Rockerboard static balance in A/P and R/L directions x 30s each ; Rockerboard static balance in A/P with 4# wand raise x10  Rockerboard static balance R/L  directions with 4# wand rotations x 10 each direction; Heel/toe raises without UE support x 10 each; Tandem gait in // bars on Airex Balance beam x 8 lengths without UE support, VCs to activate glutes and press GT down to create stable base of support   PT Education - 11/18/18 1243    Education Details  exercise technique, balance strategies    Person(s) Educated  Patient    Methods  Explanation;Demonstration;Verbal cues    Comprehension  Verbalized understanding;Need further instruction;Returned demonstration       PT Short Term Goals - 11/04/18 1122      PT SHORT TERM GOAL #1   Title  Pt will be independent with HEP in order to improve strength and balance in order to decrease fall risk and improve function at home and work.     Time  3    Period  Weeks    Status  Achieved        PT Long Term Goals - 11/04/18 1122      PT LONG TERM GOAL #1   Title  Pt will improve DGI by at least 3 points in order to demonstrate clinically significant improvement in balance and decreased risk for falls.    Baseline  09/01/18: 16/24; 10/05/18: 22/24; 11/04/18: 24/24    Time  6    Period  Weeks    Status  Achieved      PT LONG TERM GOAL #2   Title  Pt will be able to perform sit to stand without UE support in order to improve functional independence with transfers    Baseline  09/01/18: unable to perform; 10/05/18: patient able to perform from standard chair with UE reach; 11/04/18: 5TSTS in 17.1 seconds    Time  6    Period  Weeks    Status  Achieved      PT LONG TERM GOAL #3   Title  Pt will increase 10MWT by at least 0.13 m/s in order to demonstrate clinically significant improvement in community ambulation.    Baseline  09/01/18: self-selected: 14.4s = 0.69 m/s, Fastest: 12.7s = 0.79 m/sec; 10/05/18: self-selected: 12.2s= 0.45m/s fastest: 9.2s= 1.03 m/s; 11/04/18: self-selected: 12.2s= 0.1m/s fastest: 8.1s= 1.23 m/s    Time  6    Period  Weeks    Status  Achieved      PT LONG TERM  GOAL #4   Title  Patient will demonstrate improved BLE strength and balance as evidenced by his ability to independently perform 5TSTS without UE support or UE reach from a standard height chair without LOB in order to decrease risk of falls at home and in the community.    Baseline  10/05/2018: UE reach, SBA/CGA for increased repetitions; 11/04/18: 17.1s  Time  4    Period  Weeks    Status  Achieved    Target Date  --      PT LONG TERM GOAL #5   Title  Patient will demonstrate step-through gait pattern independently when descending stairs without UE support to improve mobility in the community and optimize independence.    Baseline  10/05/2018: step-to with LUE support; 11/04/18: Able to perform without UE support with reciprocal pattern    Time  4    Period  Weeks    Status  Achieved      Additional Long Term Goals   Additional Long Term Goals  Yes      PT LONG TERM GOAL #6   Title  Pt will decrease 5TSTS by at least 3 seconds in order to demonstrate clinically significant improvement in LE strength     Baseline  09/01/18: unable to perform; 10/05/18: patient able to perform from standard chair with UE reach; 11/04/18: 17.1 seconds    Time  8    Period  Weeks    Status  New    Target Date  12/30/18      PT LONG TERM GOAL #7   Title  Pt will be able to perform single leg balance for greater than 12 seconds on each leg in order to improve balance and decrease his risk for falls    Baseline  11/04/18: 7-9 seconds on both LEs    Time  [place] - 11/18/18 1243    Clinical Impression Statement  Patient presents to clinic with excellent motivation. Patient continues to tolerate all increases in resistance and repetition with exercises and advancements with balance activties. Patient will continue to benefit from skilled therapeutic intervention to address deficits in strength, balance, and mobility for improved QOL and function.     Rehab Potential  Good    Clinical Impairments Affecting Rehab Potential  (+) symptoms have resolved    PT Frequency  2x / week    PT Duration  8 weeks    PT Treatment/Interventions  ADLs/Self Care Home Management;Aquatic Therapy;Canalith Repostioning;Cryotherapy;Electrical Stimulation;Iontophoresis 4mg /ml Dexamethasone;Moist Heat;Traction;Ultrasound;DME Instruction;Gait training;Stair training;Functional mobility training;Therapeutic activities;Therapeutic exercise;Balance training;Neuromuscular re-education;Cognitive remediation;Patient/family education;Manual techniques;Passive range of motion;Dry needling;Vestibular    PT Next Visit Plan  Continue to progress strengthening and balance    PT Home Exercise Plan  Sit to stand without UE support, mini squast, tandem and single leg balance;    Consulted and Agree with Plan of Care  Patient       Patient will benefit from skilled therapeutic intervention in order to improve the following deficits and impairments:  Abnormal gait, Decreased balance, Decreased strength, Difficulty walking  Visit Diagnosis: Muscle weakness (generalized)  Unsteadiness on feet     Problem List Patient Active Problem List   Diagnosis Date Noted  . Dizziness 08/22/2018  . Gait instability 10/27/2016  . Essential hypertension 07/25/2014  . Atherosclerosis of abdominal aorta ([place]) 05/19/2013  . Abnormal EKG 08/09/2012  . Prostate cancer [facility]) 08/09/2012   Myles Gip PT, DPT [phone] 11/18/2018, 12:46 PM  [place] MAIN [facility] SERVICES [address] [place], [place], [address] Phone: [phone]   Fax:  [phone]  Name: AZARIAS CHIOU MRN: [id number] Date of Birth: [birth year]

## 2018-11-22 ENCOUNTER — Ambulatory Visit: Payer: Medicare Other | Admitting: Physical Therapy

## 2018-11-22 DIAGNOSIS — R2681 Unsteadiness on feet: Secondary | ICD-10-CM

## 2018-11-22 DIAGNOSIS — M6281 Muscle weakness (generalized): Secondary | ICD-10-CM

## 2018-11-22 NOTE — Therapy (Signed)
[place] MAIN [facility] SERVICES [address] [place], [place], [address] Phone: [phone]   Fax:  [phone]  Physical Therapy Treatment  Patient Details  Name: Stuart Smith MRN: [id number] Date of Birth: 03/15/1927 Referring Provider (PT): Dr. Kary Kos   Encounter Date: 11/22/2018  PT End of Session - 11/22/18 0850    Visit Number  18    Number of Visits  32    Date for PT Re-Evaluation  12/30/18    Authorization Type  Progress note: 3/10; Last goals: 11/04/18    PT Start Time  0845    PT Stop Time  0925    PT Time Calculation (min)  40 min    Equipment Utilized During Treatment  Gait belt    Activity Tolerance  Patient tolerated treatment well    Behavior During Therapy  WFL for tasks assessed/performed       Past Medical History:  Diagnosis Date  . Cataracts, bilateral   . Depression   . Elevated PSA   . H/O seasonal allergies   . History of chickenpox   . Hypertension   . Prostate cancer [facility]) 2013   Surgery   . Torticollis     Past Surgical History:  Procedure Laterality Date  . BACK SURGERY  1995  . GALLBLADDER SURGERY    . PROSTATE SURGERY  2013    There were no vitals filed for this visit.  Subjective Assessment - 11/22/18 0845    Subjective  Patient reports that he is having some neck stiffness as his Botox wears off. Patient denies any other changes since his last visit. He goes back to his opthamologist tomorrow to be evaluated, and he is feeling anxious/excited to see what the outcome is. Patient reports that overall things are good and he feels well.    Patient is accompained by:  --   Wife who remained in the waiting room   Pertinent History  Pt reports that he was washing his head in the sink on 08/19/18 and had a severe bout of dizziness. He reports feeling "totally disoriented." Unclear if he had any vertigo. He states that after he brought his head back up the dizziness resolved. Then the following Sunday  he went to stand up during communion at church and got very dizzy. He came to the [facility] ER and was worked up for a CVA. MRI negative for acute changes and carotid ultrasound showed less than 50% stenosis of the internal carotid arteries bilaterally. Pt was kept overnight for observation and his dizziness persisted into the morning. Pt states that he was seen by physical therapist at the hospital who "threw me around" and it "helped a little bit." Pt was also treated with oral prednisone and meclizine and his symptoms improved. Pt reports that his symptoms are mostly resolved since that time with the exception of some unsteadiness. Pt denies any head trauma or MVA. PMH includes prostate cancer x 6 years for which he has been treated and has resulted in some weakness. Next Friday he will be having botox injections for his chronic torticollis. Prior history of low back surgery. Pt reports that ~1 year ago he had an episode of "haziness" during church, "then everything turned gray" and pt could not stand up. That cleared on it's own after going home and resting. Pt reports some "thickness" feeling on the underside of his feet.     Limitations  Walking    Diagnostic tests  MRI negative for  acute changes and carotid ultrasound showed less than 50% stenosis of the internal carotid arteries bilaterally.    Patient Stated Goals  Improve balance    Currently in Pain?  No/denies       TREATMENT  Ther-ex Octane HIIT L6x 3 min backward, L10x 3 min forward during history. Fatigue monitored throughout.  Quantumleg press 155# x20215# x 15, 225# x 10, 230# x 15, pt requires assist for first rep of heavier set; Sit to stand without UE support from airex seat x5 Sit to stand without UE support from regular height chair, cues for increased anterior weight shiftingx5 with tactile cues/min A for hip extension and verbal cues for foot position  Neuromuscular Re-education Rockerboard static balance in A/P  and R/L  directions x 30s each ; Rockerboard static balance in A/P with 4# wand raise x10  Rockerboard static balance R/L directions with 4# wand rotations x 10 each direction; Heel/toe raises without UE support x 10 each; Tandem gaitin // bars on Airex Balance beam x 10lengths without UE support, VCs to activate glutes and press GT down to create stable base of support Side stepping in // bars on Airex balance beam x6 lengths without UE support, verbal cues to press evenly through the foot.   PT Education - 11/22/18 0850    Education Details  exercise technique, balance strategies    Person(s) Educated  Patient    Methods  Explanation;Demonstration;Verbal cues    Comprehension  Verbalized understanding;Need further instruction;Returned demonstration       PT Short Term Goals - 11/04/18 1122      PT SHORT TERM GOAL #1   Title  Pt will be independent with HEP in order to improve strength and balance in order to decrease fall risk and improve function at home and work.     Time  3    Period  Weeks    Status  Achieved        PT Long Term Goals - 11/04/18 1122      PT LONG TERM GOAL #1   Title  Pt will improve DGI by at least 3 points in order to demonstrate clinically significant improvement in balance and decreased risk for falls.    Baseline  09/01/18: 16/24; 10/05/18: 22/24; 11/04/18: 24/24    Time  6    Period  Weeks    Status  Achieved      PT LONG TERM GOAL #2   Title  Pt will be able to perform sit to stand without UE support in order to improve functional independence with transfers    Baseline  09/01/18: unable to perform; 10/05/18: patient able to perform from standard chair with UE reach; 11/04/18: 5TSTS in 17.1 seconds    Time  6    Period  Weeks    Status  Achieved      PT LONG TERM GOAL #3   Title  Pt will increase 10MWT by at least 0.13 m/s in order to demonstrate clinically significant improvement in community ambulation.    Baseline  09/01/18: self-selected: 14.4s =  0.69 m/s, Fastest: 12.7s = 0.79 m/sec; 10/05/18: self-selected: 12.2s= 0.74m/s fastest: 9.2s= 1.03 m/s; 11/04/18: self-selected: 12.2s= 0.72m/s fastest: 8.1s= 1.23 m/s    Time  6    Period  Weeks    Status  Achieved      PT LONG TERM GOAL #4   Title  Patient will demonstrate improved BLE strength and balance as evidenced by his ability to independently perform  5TSTS without UE support or UE reach from a standard height chair without LOB in order to decrease risk of falls at home and in the community.    Baseline  10/05/2018: UE reach, SBA/CGA for increased repetitions; 11/04/18: 17.1s     Time  4    Period  Weeks    Status  Achieved    Target Date  --      PT LONG TERM GOAL #5   Title  Patient will demonstrate step-through gait pattern independently when descending stairs without UE support to improve mobility in the community and optimize independence.    Baseline  10/05/2018: step-to with LUE support; 11/04/18: Able to perform without UE support with reciprocal pattern    Time  4    Period  Weeks    Status  Achieved      Additional Long Term Goals   Additional Long Term Goals  Yes      PT LONG TERM GOAL #6   Title  Pt will decrease 5TSTS by at least 3 seconds in order to demonstrate clinically significant improvement in LE strength     Baseline  09/01/18: unable to perform; 10/05/18: patient able to perform from standard chair with UE reach; 11/04/18: 17.1 seconds    Time  8    Period  Weeks    Status  New    Target Date  12/30/18      PT LONG TERM GOAL #7   Title  Pt will be able to perform single leg balance for greater than 12 seconds on each leg in order to improve balance and decrease his risk for falls    Baseline  11/04/18: 7-9 seconds on both LEs    Time  Ingenio - 11/22/18 0936    Clinical Impression Statement  Patient presents to clinic with increased neck stiffness and reports of fatigue but is amenable to therapy.  Patient tolerated increases in resistance on all exercises, but as a result required increased rest time after sets. Patient demonstrated excellent ability to recover balance on a compliant surface when walking tandem and laterally; he continues to benefit from initial verbal cues for balance strategies pertinent to the task, however he is able to apply the strategies without cueing for the remaining repetitions. Patient will continue to benefit from skilled therapeutic intervention to address deficits in strength, balanc, and mobility for improved QOL and decreased risk of falls.     Rehab Potential  Good    Clinical Impairments Affecting Rehab Potential  (+) symptoms have resolved    PT Frequency  2x / week    PT Duration  8 weeks    PT Treatment/Interventions  ADLs/Self Care Home Management;Aquatic Therapy;Canalith Repostioning;Cryotherapy;Electrical Stimulation;Iontophoresis 4mg /ml Dexamethasone;Moist Heat;Traction;Ultrasound;DME Instruction;Gait training;Stair training;Functional mobility training;Therapeutic activities;Therapeutic exercise;Balance training;Neuromuscular re-education;Cognitive remediation;Patient/family education;Manual techniques;Passive range of motion;Dry needling;Vestibular    PT Next Visit Plan  Continue to progress strengthening and balance    PT Home Exercise Plan  Sit to stand without UE support, mini squast, tandem and single leg balance;    Consulted and Agree with Plan of Care  Patient       Patient will benefit from skilled therapeutic intervention in order to improve the following deficits and impairments:  Abnormal gait, Decreased balance, Decreased strength, Difficulty walking  Visit Diagnosis: Muscle weakness (generalized)  Unsteadiness on feet  Problem List Patient Active Problem List   Diagnosis Date Noted  . Dizziness 08/22/2018  . Gait instability 10/27/2016  . Essential hypertension 07/25/2014  . Atherosclerosis of abdominal aorta ([place])  05/19/2013  . Abnormal EKG 08/09/2012  . Prostate cancer [facility]) 08/09/2012   Myles Gip PT, DPT [phone] 11/22/2018, 9:40 AM  [place] MAIN [facility] SERVICES [address] [place], [place], [address] Phone: [phone]   Fax:  [phone]  Name: Stuart Smith MRN: [id number] Date of Birth: Sep 15, 1927

## 2018-11-25 ENCOUNTER — Ambulatory Visit: Payer: Medicare Other

## 2018-11-25 DIAGNOSIS — M6281 Muscle weakness (generalized): Secondary | ICD-10-CM

## 2018-11-25 DIAGNOSIS — R2681 Unsteadiness on feet: Secondary | ICD-10-CM

## 2018-11-25 NOTE — Therapy (Signed)
[place] MAIN [facility] SERVICES [address] [place], [place], [address] Phone: [phone]   Fax:  [phone]  Physical Therapy Treatment  Patient Details  Name: Stuart Smith MRN: [id number] Date of Birth: 03/28/1927 Referring Provider (PT): Dr. Kary Kos   Encounter Date: 11/25/2018  PT End of Session - 11/25/18 0854    Visit Number  19    Number of Visits  32    Date for PT Re-Evaluation  12/30/18    Authorization Type  Progress note: 4/10; Last goals: 11/04/18    PT Start Time  0845    PT Stop Time  0929    PT Time Calculation (min)  44 min    Equipment Utilized During Treatment  Gait belt    Activity Tolerance  Patient tolerated treatment well    Behavior During Therapy  WFL for tasks assessed/performed       Past Medical History:  Diagnosis Date  . Cataracts, bilateral   . Depression   . Elevated PSA   . H/O seasonal allergies   . History of chickenpox   . Hypertension   . Prostate cancer [facility]) 2013   Surgery   . Torticollis     Past Surgical History:  Procedure Laterality Date  . BACK SURGERY  1995  . GALLBLADDER SURGERY    . PROSTATE SURGERY  2013    There were no vitals filed for this visit.  Subjective Assessment - 11/25/18 0850    Subjective  Patient reports he went golfing yesterday with his friends. Is feeling good this morning denies any falls or LOB. has a hard time getting out of low chairs.     Patient is accompained by:  --   Wife who remained in the waiting room   Pertinent History  Pt reports that he was washing his head in the sink on 08/19/18 and had a severe bout of dizziness. He reports feeling "totally disoriented." Unclear if he had any vertigo. He states that after he brought his head back up the dizziness resolved. Then the following Sunday he went to stand up during communion at church and got very dizzy. He came to the [facility] ER and was worked up for a CVA. MRI negative for acute changes and carotid  ultrasound showed less than 50% stenosis of the internal carotid arteries bilaterally. Pt was kept overnight for observation and his dizziness persisted into the morning. Pt states that he was seen by physical therapist at the hospital who "threw me around" and it "helped a little bit." Pt was also treated with oral prednisone and meclizine and his symptoms improved. Pt reports that his symptoms are mostly resolved since that time with the exception of some unsteadiness. Pt denies any head trauma or MVA. PMH includes prostate cancer x 6 years for which he has been treated and has resulted in some weakness. Next Friday he will be having botox injections for his chronic torticollis. Prior history of low back surgery. Pt reports that ~1 year ago he had an episode of "haziness" during church, "then everything turned gray" and pt could not stand up. That cleared on it's own after going home and resting. Pt reports some "thickness" feeling on the underside of his feet.     Limitations  Walking    Diagnostic tests  MRI negative for acute changes and carotid ultrasound showed less than 50% stenosis of the internal carotid arteries bilaterally.    Patient Stated Goals  Improve balance  Currently in Pain?  No/denies       Ther-ex  Octane HIIT L11 x 30s, L5 x 30s for 5 minutes with 1 minute cool down during history. Fatigue monitored (3 minutes unbilled);  Sit to stand from lowering plinth table: (20 inch, 19 inch, 17 inch) with emphasis on decreasing reliance upon UE's.  performing as fast as possible to improve power, cues for increased anterior weight shifting x 5x each level   Quantum leg press, 225# x 15, 230# x 15, pt requires assist for first rep of every set;  Quantum leg press, single leg: #105 15x each LE, requires cueing for velocity of  movement  Neuromuscular Re-education   Heel/toe raises without UE support x 10 each; CGA with verbal cues for shoulders back for upright positioning  Airex  balance beam Tandem gait in // bars x 8 lengths without UE support; CGA with verbal cues for positioning and tactile cueing to shoulders for upright position to decrease forward trunk lean.   Sidestepping on Airex balance beam x 6 lengths without UE support; CGA, verbal cues for upright posture and head position.      Pt educated throughout session about proper posture and technique with exercises. Improved exercise technique, movement at target joints, use of target muscles after min to mod verbal, visual, tactile cues.                          PT Education - 11/25/18 0853    Education Details  exercise technique, balance strategies, sit to stands    Person(s) Educated  Patient    Methods  Explanation;Demonstration;Tactile cues;Verbal cues    Comprehension  Verbalized understanding;Returned demonstration;Tactile cues required;Need further instruction;Verbal cues required       PT Short Term Goals - 11/04/18 1122      PT SHORT TERM GOAL #1   Title  Pt will be independent with HEP in order to improve strength and balance in order to decrease fall risk and improve function at home and work.     Time  3    Period  Weeks    Status  Achieved        PT Long Term Goals - 11/04/18 1122      PT LONG TERM GOAL #1   Title  Pt will improve DGI by at least 3 points in order to demonstrate clinically significant improvement in balance and decreased risk for falls.    Baseline  09/01/18: 16/24; 10/05/18: 22/24; 11/04/18: 24/24    Time  6    Period  Weeks    Status  Achieved      PT LONG TERM GOAL #2   Title  Pt will be able to perform sit to stand without UE support in order to improve functional independence with transfers    Baseline  09/01/18: unable to perform; 10/05/18: patient able to perform from standard chair with UE reach; 11/04/18: 5TSTS in 17.1 seconds    Time  6    Period  Weeks    Status  Achieved      PT LONG TERM GOAL #3   Title  Pt will increase  10MWT by at least 0.13 m/s in order to demonstrate clinically significant improvement in community ambulation.    Baseline  09/01/18: self-selected: 14.4s = 0.69 m/s, Fastest: 12.7s = 0.79 m/sec; 10/05/18: self-selected: 12.2s= 0.23m/s fastest: 9.2s= 1.03 m/s; 11/04/18: self-selected: 12.2s= 0.85m/s fastest: 8.1s= 1.23 m/s    Time  6    Period  Weeks    Status  Achieved      PT LONG TERM GOAL #4   Title  Patient will demonstrate improved BLE strength and balance as evidenced by his ability to independently perform 5TSTS without UE support or UE reach from a standard height chair without LOB in order to decrease risk of falls at home and in the community.    Baseline  10/05/2018: UE reach, SBA/CGA for increased repetitions; 11/04/18: 17.1s     Time  4    Period  Weeks    Status  Achieved    Target Date  --      PT LONG TERM GOAL #5   Title  Patient will demonstrate step-through gait pattern independently when descending stairs without UE support to improve mobility in the community and optimize independence.    Baseline  10/05/2018: step-to with LUE support; 11/04/18: Able to perform without UE support with reciprocal pattern    Time  4    Period  Weeks    Status  Achieved      Additional Long Term Goals   Additional Long Term Goals  Yes      PT LONG TERM GOAL #6   Title  Pt will decrease 5TSTS by at least 3 seconds in order to demonstrate clinically significant improvement in LE strength     Baseline  09/01/18: unable to perform; 10/05/18: patient able to perform from standard chair with UE reach; 11/04/18: 17.1 seconds    Time  8    Period  Weeks    Status  New    Target Date  12/30/18      PT LONG TERM GOAL #7   Title  Pt will be able to perform single leg balance for greater than 12 seconds on each leg in order to improve balance and decrease his risk for falls    Baseline  11/04/18: 7-9 seconds on both LEs    Time  [place]ennard -  11/25/18 5361    Clinical Impression Statement  Patient presents to physical therapy with good motivation. Patient challenged with maintaining upright position due to difficulty maintaining head position and keeping shoulders back from concurrent torticollis. Upon correction of positioning patient demonstrated improved ankle stability reactions with decreased episodes of instability or use of UE's.  Patient will continue to benefit from skilled therapeutic intervention to address deficits in strength, balance, and mobility for improved QOL and function.    Rehab Potential  Good    Clinical Impairments Affecting Rehab Potential  (+) symptoms have resolved    PT Frequency  2x / week    PT Duration  8 weeks    PT Treatment/Interventions  ADLs/Self Care Home Management;Aquatic Therapy;Canalith Repostioning;Cryotherapy;Electrical Stimulation;Iontophoresis 4mg /ml Dexamethasone;Moist Heat;Traction;Ultrasound;DME Instruction;Gait training;Stair training;Functional mobility training;Therapeutic activities;Therapeutic exercise;Balance training;Neuromuscular re-education;Cognitive remediation;Patient/family education;Manual techniques;Passive range of motion;Dry needling;Vestibular    PT Next Visit Plan  Continue to progress strengthening and balance    PT Home Exercise Plan  Sit to stand without UE support, mini squast, tandem and single leg balance;    Consulted and Agree with Plan of Care  Patient       Patient will benefit from skilled therapeutic intervention in order to improve the following deficits and impairments:  Abnormal gait, Decreased balance, Decreased strength, Difficulty walking  Visit Diagnosis: Muscle weakness (generalized)  Unsteadiness on feet     Problem List Patient Active Problem List   Diagnosis Date Noted  . Dizziness 08/22/2018  . Gait instability 10/27/2016  . Essential hypertension 07/25/2014  . Atherosclerosis of abdominal aorta ([place]) 05/19/2013  . Abnormal EKG  08/09/2012  . Prostate cancer ([place]) 08/09/2012   Janna Arch, PT, DPT   11/25/2018, 9:30 AM  [place] MAIN [facility] SERVICES [address] [place], [place], [address] Phone: [phone]   Fax:  [phone]  Name: FRANCISZEK PLATTEN MRN: [id number] Date of Birth: 1927/08/24

## 2018-11-29 ENCOUNTER — Ambulatory Visit: Payer: Medicare Other

## 2018-11-29 DIAGNOSIS — M6281 Muscle weakness (generalized): Secondary | ICD-10-CM | POA: Diagnosis not present

## 2018-11-29 DIAGNOSIS — R2681 Unsteadiness on feet: Secondary | ICD-10-CM

## 2018-11-29 NOTE — Therapy (Signed)
[place] MAIN [facility] SERVICES [address] [place], [place], [address] Phone: [phone]   Fax:  [phone]  Physical Therapy Treatment  Patient Details  Name: Stuart Smith MRN: [id number] Date of Birth: [birth year] Referring Provider (PT): Dr. Kary Kos   Encounter Date: 11/29/2018  PT End of Session - 11/29/18 0850    Visit Number  20    Number of Visits  32    Date for PT Re-Evaluation  12/30/18    Authorization Type  Progress note: 5/10; Last goals: 11/04/18    PT Start Time  0846    PT Stop Time  0930    PT Time Calculation (min)  44 min    Equipment Utilized During Treatment  Gait belt    Activity Tolerance  Patient tolerated treatment well    Behavior During Therapy  WFL for tasks assessed/performed       Past Medical History:  Diagnosis Date  . Cataracts, bilateral   . Depression   . Elevated PSA   . H/O seasonal allergies   . History of chickenpox   . Hypertension   . Prostate cancer [facility]) 2013   Surgery   . Torticollis     Past Surgical History:  Procedure Laterality Date  . BACK SURGERY  1995  . GALLBLADDER SURGERY    . PROSTATE SURGERY  2013    There were no vitals filed for this visit.  Subjective Assessment - 11/29/18 0849    Subjective  Patient reports having a good morning.  Has been doing his exercises. Has been able to go church yesterday without any falls or LOB.     Patient is accompained by:  --   Wife who remained in the waiting room   Pertinent History  Pt reports that he was washing his head in the sink on 08/19/18 and had a severe bout of dizziness. He reports feeling "totally disoriented." Unclear if he had any vertigo. He states that after he brought his head back up the dizziness resolved. Then the following Sunday he went to stand up during communion at church and got very dizzy. He came to the [facility] and was worked up for a CVA. MRI negative for acute changes and carotid ultrasound showed less  than 50% stenosis of the internal carotid arteries bilaterally. Pt was kept overnight for observation and his dizziness persisted into the morning. Pt states that he was seen by physical therapist at the hospital who "threw me around" and it "helped a little bit." Pt was also treated with oral prednisone and meclizine and his symptoms improved. Pt reports that his symptoms are mostly resolved since that time with the exception of some unsteadiness. Pt denies any head trauma or MVA. PMH includes prostate cancer x 6 years for which he has been treated and has resulted in some weakness. Next Friday he will be having botox injections for his chronic torticollis. Prior history of low back surgery. Pt reports that ~1 year ago he had an episode of "haziness" during church, "then everything turned gray" and pt could not stand up. That cleared on it's own after going home and resting. Pt reports some "thickness" feeling on the underside of his feet.     Limitations  Walking    Diagnostic tests  MRI negative for acute changes and carotid ultrasound showed less than 50% stenosis of the internal carotid arteries bilaterally.    Patient Stated Goals  Improve balance    Currently in  Pain?  No/denies        Ther-ex  Octane HIIT L11 x 30s, L5 x 30s for 5 minutes with 1 minute cool down during history. Fatigue monitored (3 minutes unbilled);   Sit to stand from lowering plinth table: ( 18 inch, 17 inch) with emphasis on decreasing reliance upon UE's.  performing as fast as possible to improve power, cues for increased anterior weight shifting x 5x each level, 2 sets at lowest level     Neuromuscular Re-education    Heel/toe raises without UE support x 10 each; CGA with verbal cues for shoulders back for upright positioning   Airex: baseball/softball toss ball 10x inside and outside BOS with occasional posterior LOB; verbal cueing for alignment  Airex pad: cone taps with SUE support. To promote coordination and  stability in single limb, requires single UE support due to knocking cones over without support. CGA and tactile and verbal cueing for which leg and color based on guidance.    Bend and pick up cones from ground in // bars to simulate picking up ball from golf hole on the green when patient is golfing due to patient having frequent LOB when performing. CGA and verbal cueing for sequencing. 10x CGA   Stand on green step reach down to pick up weighted green ball to simulate reaching down into golf course hole to pick up ball. Verbal cueing for widening BOS to increase stability and depth of squat to pick up ball. 5x  With CGA. SUE support   Step over and back orange hurdle 10x each LE and verbal cueing to increase hip/knee flexion for clearance.; CGA with no UE support  Step over orange hurdle sideways with focus on foot positioning and spatial awareness, CGA 10x each LE.    Pt educated throughout session about proper posture and technique with exercises. Improved exercise technique, movement at target joints, use of target muscles after min to mod verbal, visual, tactile cues.                          PT Education - 11/29/18 0850    Education Details  exercise technique, balance strategies, sit to stands    Person(s) Educated  Patient    Methods  Explanation;Demonstration;Tactile cues;Verbal cues    Comprehension  Verbalized understanding;Returned demonstration;Verbal cues required;Tactile cues required;Need further instruction       PT Short Term Goals - 11/04/18 1122      PT SHORT TERM GOAL #1   Title  Pt will be independent with HEP in order to improve strength and balance in order to decrease fall risk and improve function at home and work.     Time  3    Period  Weeks    Status  Achieved        PT Long Term Goals - 11/04/18 1122      PT LONG TERM GOAL #1   Title  Pt will improve DGI by at least 3 points in order to demonstrate clinically significant  improvement in balance and decreased risk for falls.    Baseline  09/01/18: 16/24; 10/05/18: 22/24; 11/04/18: 24/24    Time  6    Period  Weeks    Status  Achieved      PT LONG TERM GOAL #2   Title  Pt will be able to perform sit to stand without UE support in order to improve functional independence with transfers    Baseline  09/01/18: unable to perform; 10/05/18: patient able to perform from standard chair with UE reach; 11/04/18: 5TSTS in 17.1 seconds    Time  6    Period  Weeks    Status  Achieved      PT LONG TERM GOAL #3   Title  Pt will increase 10MWT by at least 0.13 m/s in order to demonstrate clinically significant improvement in community ambulation.    Baseline  09/01/18: self-selected: 14.4s = 0.69 m/s, Fastest: 12.7s = 0.79 m/sec; 10/05/18: self-selected: 12.2s= 0.87m/s fastest: 9.2s= 1.03 m/s; 11/04/18: self-selected: 12.2s= 0.61m/s fastest: 8.1s= 1.23 m/s    Time  6    Period  Weeks    Status  Achieved      PT LONG TERM GOAL #4   Title  Patient will demonstrate improved BLE strength and balance as evidenced by his ability to independently perform 5TSTS without UE support or UE reach from a standard height chair without LOB in order to decrease risk of falls at home and in the community.    Baseline  10/05/2018: UE reach, SBA/CGA for increased repetitions; 11/04/18: 17.1s     Time  4    Period  Weeks    Status  Achieved    Target Date  --      PT LONG TERM GOAL #5   Title  Patient will demonstrate step-through gait pattern independently when descending stairs without UE support to improve mobility in the community and optimize independence.    Baseline  10/05/2018: step-to with LUE support; 11/04/18: Able to perform without UE support with reciprocal pattern    Time  4    Period  Weeks    Status  Achieved      Additional Long Term Goals   Additional Long Term Goals  Yes      PT LONG TERM GOAL #6   Title  Pt will decrease 5TSTS by at least 3 seconds in order to  demonstrate clinically significant improvement in LE strength     Baseline  09/01/18: unable to perform; 10/05/18: patient able to perform from standard chair with UE reach; 11/04/18: 17.1 seconds    Time  8    Period  Weeks    Status  New    Target Date  12/30/18      PT LONG TERM GOAL #7   Title  Pt will be able to perform single leg balance for greater than 12 seconds on each leg in order to improve balance and decrease his risk for falls    Baseline  11/04/18: 7-9 seconds on both LEs    Time  8    Period  Weeks    Status  New            Plan - 11/29/18 0933    Clinical Impression Statement  Patient continues to progress with functional mobility and stability on stable and unstable surfaces. Simulation of movements required for patient to golf was performed with patient challenged by squatting down to pick up ball from ground when on elevated surface, squat depth improved with widening BOS with decreased instability. Patient will continue to benefit from skilled therapeutic intervention to address deficits in strength, balance, and mobility for improved QOL and function.    Rehab Potential  Good    Clinical Impairments Affecting Rehab Potential  (+) symptoms have resolved    PT Frequency  2x / week    PT Duration  8 weeks    PT Treatment/Interventions  ADLs/Self  Care Home Management;Aquatic Therapy;Canalith Repostioning;Cryotherapy;Electrical Stimulation;Iontophoresis 4mg /ml Dexamethasone;Moist Heat;Traction;Ultrasound;DME Instruction;Gait training;Stair training;Functional mobility training;Therapeutic activities;Therapeutic exercise;Balance training;Neuromuscular re-education;Cognitive remediation;Patient/family education;Manual techniques;Passive range of motion;Dry needling;Vestibular    PT Next Visit Plan  goals    PT Home Exercise Plan  Sit to stand without UE support, mini squast, tandem and single leg balance;    Consulted and Agree with Plan of Care  Patient        Patient will benefit from skilled therapeutic intervention in order to improve the following deficits and impairments:  Abnormal gait, Decreased balance, Decreased strength, Difficulty walking  Visit Diagnosis: Muscle weakness (generalized)  Unsteadiness on feet     Problem List Patient Active Problem List   Diagnosis Date Noted  . Dizziness 08/22/2018  . Gait instability 10/27/2016  . Essential hypertension 07/25/2014  . Atherosclerosis of abdominal aorta ([place]) 05/19/2013  . Abnormal EKG 08/09/2012  . Prostate cancer ([place]) 08/09/2012   Janna Arch, PT, DPT   11/29/2018, 9:34 AM  [place] MAIN [facility] SERVICES [address] [place], [place], [address] Phone: [phone]   Fax:  [phone]  Name: Stuart Smith MRN: [id number] Date of Birth: 1927/05/25

## 2018-12-02 ENCOUNTER — Ambulatory Visit: Payer: Medicare Other

## 2018-12-02 DIAGNOSIS — M6281 Muscle weakness (generalized): Secondary | ICD-10-CM | POA: Diagnosis not present

## 2018-12-02 DIAGNOSIS — R2681 Unsteadiness on feet: Secondary | ICD-10-CM

## 2018-12-02 NOTE — Therapy (Signed)
[place] MAIN [facility] SERVICES [address] [place], [place], [address] Phone: [phone]   Fax:  [phone]  Physical Therapy Treatment  Patient Details  Name: Stuart Smith MRN: [id number] Date of Birth: 1927-03-19 Referring Provider (PT): Dr. Kary Kos   Encounter Date: 12/02/2018  PT End of Session - 12/02/18 2047    Visit Number  21    Number of Visits  32    Date for PT Re-Evaluation  12/30/18    Authorization Type  Last goals: 11/04/18    PT Start Time  0847    PT Stop Time  0930    PT Time Calculation (min)  43 min    Equipment Utilized During Treatment  Gait belt    Activity Tolerance  Patient tolerated treatment well    Behavior During Therapy  [facility] for tasks assessed/performed       Past Medical History:  Diagnosis Date  . Cataracts, bilateral   . Depression   . Elevated PSA   . H/O seasonal allergies   . History of chickenpox   . Hypertension   . Prostate cancer [facility]) 2013   Surgery   . Torticollis     Past Surgical History:  Procedure Laterality Date  . BACK SURGERY  1995  . GALLBLADDER SURGERY    . PROSTATE SURGERY  2013    There were no vitals filed for this visit.  Subjective Assessment - 12/02/18 2046    Subjective  Patient reports he is doing well today. He is consistent with his HEP and has noticed improvement in his strength. No pain and no specific questions or concerns.     Patient is accompained by:  --   Wife who remained in the waiting room   Pertinent History  Pt reports that he was washing his head in the sink on 08/19/18 and had a severe bout of dizziness. He reports feeling "totally disoriented." Unclear if he had any vertigo. He states that after he brought his head back up the dizziness resolved. Then the following Sunday he went to stand up during communion at church and got very dizzy. He came to the [facility] ER and was worked up for a CVA. MRI negative for acute changes and carotid ultrasound  showed less than 50% stenosis of the internal carotid arteries bilaterally. Pt was kept overnight for observation and his dizziness persisted into the morning. Pt states that he was seen by physical therapist at the hospital who "threw me around" and it "helped a little bit." Pt was also treated with oral prednisone and meclizine and his symptoms improved. Pt reports that his symptoms are mostly resolved since that time with the exception of some unsteadiness. Pt denies any head trauma or MVA. PMH includes prostate cancer x 6 years for which he has been treated and has resulted in some weakness. Next Friday he will be having botox injections for his chronic torticollis. Prior history of low back surgery. Pt reports that ~1 year ago he had an episode of "haziness" during church, "then everything turned gray" and pt could not stand up. That cleared on it's own after going home and resting. Pt reports some "thickness" feeling on the underside of his feet.     Limitations  Walking    Diagnostic tests  MRI negative for acute changes and carotid ultrasound showed less than 50% stenosis of the internal carotid arteries bilaterally.    Patient Stated Goals  Improve balance    Currently  in Pain?  No/denies         TREATMENT   Ther-ex Octane HIIT L11x 30s, L5x 60s for 5 minutes with 1 minute cool down during history. Fatigue monitored(9minutes unbilled); Split squats with 2 Airex pads for rear knee tap x 8, x 4 each side; Sit to stand without UE support from regular height chair performing as fast as possible to improve power, cues for increased anterior weight shiftingx 10; Quantumleg press 230# x 15, pt requires assist for first rep of every set;   Neuromuscular Re-education 1/2 foam roll in A/P orientation 30s x 2; 1/2 foam roll tandem balance alternating LE 30s x 2 each; Heel/toe raises without UE support x 10 each;CGA with verbal cues for shoulders back for upright positioning Tandem  gait in // bars x 6 lengths without UE support; Tandem gait in // bars on Airex balance beam x 4 lengths without UE support; Sidestepping on Airex balance beam x 4 lengths without UE support;   Pt educated throughout session about proper posture and technique with exercises. Improved exercise technique, movement at target joints, use of target muscles after min to mod verbal, visual, tactile cues.   Pt continues to be able to increase resistance with leg press today. He struggles with stability and strength during split squats. Therapist discussed progression of strengthening in order to practice floor to standing transfers.Sit to stand continues to improve.He will need updated outcome measures/goals at some point over the next few sessions. Will continue to progress HEP as appropriate in future sessions as well as advancing strengthening and balance exercises.Pt will benefit from PT services to address deficits in strength, balance, and mobility in order to return to full function at home.                        PT Short Term Goals - 11/04/18 1122      PT SHORT TERM GOAL #1   Title  Pt will be independent with HEP in order to improve strength and balance in order to decrease fall risk and improve function at home and work.     Time  3    Period  Weeks    Status  Achieved        PT Long Term Goals - 11/04/18 1122      PT LONG TERM GOAL #1   Title  Pt will improve DGI by at least 3 points in order to demonstrate clinically significant improvement in balance and decreased risk for falls.    Baseline  09/01/18: 16/24; 10/05/18: 22/24; 11/04/18: 24/24    Time  6    Period  Weeks    Status  Achieved      PT LONG TERM GOAL #2   Title  Pt will be able to perform sit to stand without UE support in order to improve functional independence with transfers    Baseline  09/01/18: unable to perform; 10/05/18: patient able to perform from standard chair with UE  reach; 11/04/18: 5TSTS in 17.1 seconds    Time  6    Period  Weeks    Status  Achieved      PT LONG TERM GOAL #3   Title  Pt will increase 10MWT by at least 0.13 m/s in order to demonstrate clinically significant improvement in community ambulation.    Baseline  09/01/18: self-selected: 14.4s = 0.69 m/s, Fastest: 12.7s = 0.79 m/sec; 10/05/18: self-selected: 12.2s= 0.57m/s fastest: 9.2s= 1.03 m/s; 11/04/18:  self-selected: 12.2s= 0.98m/s fastest: 8.1s= 1.23 m/s    Time  6    Period  Weeks    Status  Achieved      PT LONG TERM GOAL #4   Title  Patient will demonstrate improved BLE strength and balance as evidenced by his ability to independently perform 5TSTS without UE support or UE reach from a standard height chair without LOB in order to decrease risk of falls at home and in the community.    Baseline  10/05/2018: UE reach, SBA/CGA for increased repetitions; 11/04/18: 17.1s     Time  4    Period  Weeks    Status  Achieved    Target Date  --      PT LONG TERM GOAL #5   Title  Patient will demonstrate step-through gait pattern independently when descending stairs without UE support to improve mobility in the community and optimize independence.    Baseline  10/05/2018: step-to with LUE support; 11/04/18: Able to perform without UE support with reciprocal pattern    Time  4    Period  Weeks    Status  Achieved      Additional Long Term Goals   Additional Long Term Goals  Yes      PT LONG TERM GOAL #6   Title  Pt will decrease 5TSTS by at least 3 seconds in order to demonstrate clinically significant improvement in LE strength     Baseline  09/01/18: unable to perform; 10/05/18: patient able to perform from standard chair with UE reach; 11/04/18: 17.1 seconds    Time  8    Period  Weeks    Status  New    Target Date  12/30/18      PT LONG TERM GOAL #7   Title  Pt will be able to perform single leg balance for greater than 12 seconds on each leg in order to improve balance and  decrease his risk for falls    Baseline  11/04/18: 7-9 seconds on both LEs    Time  8    Period  Weeks    Status  New            Plan - 12/02/18 2049    Clinical Impression Statement  Pt continues to be able to increase resistance with leg press today. He struggles with stability and strength during split squats. Therapist discussed progression of strengthening in order to practice floor to standing transfers.Sit to stand continues to improve.He will need updated outcome measures/goals at some point over the next few sessions. Will continue to progress HEP as appropriate in future sessions as well as advancing strengthening and balance exercises.Pt will benefit from PT services to address deficits in strength, balance, and mobility in order to return to full function at home.    Rehab Potential  Good    Clinical Impairments Affecting Rehab Potential  (+) symptoms have resolved    PT Frequency  2x / week    PT Duration  8 weeks    PT Treatment/Interventions  ADLs/Self Care Home Management;Aquatic Therapy;Canalith Repostioning;Cryotherapy;Electrical Stimulation;Iontophoresis 4mg /ml Dexamethasone;Moist Heat;Traction;Ultrasound;DME Instruction;Gait training;Stair training;Functional mobility training;Therapeutic activities;Therapeutic exercise;Balance training;Neuromuscular re-education;Cognitive remediation;Patient/family education;Manual techniques;Passive range of motion;Dry needling;Vestibular    PT Next Visit Plan  goals, progress strength and balance    PT Home Exercise Plan  Sit to stand without UE support, mini squast, tandem and single leg balance;    Consulted and Agree with Plan of Care  Patient  Patient will benefit from skilled therapeutic intervention in order to improve the following deficits and impairments:  Abnormal gait, Decreased balance, Decreased strength, Difficulty walking  Visit Diagnosis: Muscle weakness (generalized)  Unsteadiness on  feet     Problem List Patient Active Problem List   Diagnosis Date Noted  . Dizziness 08/22/2018  . Gait instability 10/27/2016  . Essential hypertension 07/25/2014  . Atherosclerosis of abdominal aorta ([place]) 05/19/2013  . Abnormal EKG 08/09/2012  . Prostate cancer ([place]) 08/09/2012   Phillips Grout PT, DPT, GCS  Huprich,Jason 12/02/2018, 8:58 PM  [place] MAIN [facility] SERVICES [address] [place], [place], [address] Phone: [phone]   Fax:  [phone]  Name: Stuart Smith MRN: [id number] Date of Birth: 1927/04/04

## 2018-12-06 ENCOUNTER — Ambulatory Visit: Payer: Medicare Other

## 2018-12-06 DIAGNOSIS — M6281 Muscle weakness (generalized): Secondary | ICD-10-CM | POA: Diagnosis not present

## 2018-12-06 DIAGNOSIS — R2681 Unsteadiness on feet: Secondary | ICD-10-CM

## 2018-12-06 NOTE — Therapy (Signed)
[place] MAIN [facility] SERVICES [address] [place], [place], [address] Phone: [phone]   Fax:  [phone]  Physical Therapy Treatment  Patient Details  Name: Stuart Smith MRN: [id number] Date of Birth: 30-Sep-1927 Referring Provider (PT): Dr. Kary Kos   Encounter Date: 12/06/2018    Past Medical History:  Diagnosis Date  . Cataracts, bilateral   . Depression   . Elevated PSA   . H/O seasonal allergies   . History of chickenpox   . Hypertension   . Prostate cancer [facility]) 2013   Surgery   . Torticollis     Past Surgical History:  Procedure Laterality Date  . BACK SURGERY  1995  . GALLBLADDER SURGERY    . PROSTATE SURGERY  2013    There were no vitals filed for this visit.  Subjective Assessment - 12/06/18 0959    Subjective  Patient feels that he is doing much better since starting therapy.  "Before I came in here I couldn't even stand from a chair".  He requested to pace himself today due to needing to [facility] a lot [facility]  He reports pain in is neck and that he will be getting Botox injections tomorrow.    Patient is accompained by:  --   Wife who remained in the waiting room   Pertinent History  Pt reports that he was washing his head in the sink on 08/19/18 and had a severe bout of dizziness. He reports feeling "totally disoriented." Unclear if he had any vertigo. He states that after he brought his head back up the dizziness resolved. Then the following Sunday he went to stand up during communion at church and got very dizzy. He came to the [facility] ER and was worked up for a CVA. MRI negative for acute changes and carotid ultrasound showed less than 50% stenosis of the internal carotid arteries bilaterally. Pt was kept overnight for observation and his dizziness persisted into the morning. Pt states that he was seen by physical therapist at the hospital who "threw me around" and it "helped a little bit." Pt was also  treated with oral prednisone and meclizine and his symptoms improved. Pt reports that his symptoms are mostly resolved since that time with the exception of some unsteadiness. Pt denies any head trauma or MVA. PMH includes prostate cancer x 6 years for which he has been treated and has resulted in some weakness. Next Friday he will be having botox injections for his chronic torticollis. Prior history of low back surgery. Pt reports that ~1 year ago he had an episode of "haziness" during church, "then everything turned gray" and pt could not stand up. That cleared on it\'s own after going home and resting. Pt reports some "thickness" feeling on the underside of his feet.     Limitations  Walking    Diagnostic tests  MRI negative for acute changes and carotid ultrasound showed less than 50% stenosis of the internal carotid arteries bilaterally.    Patient Stated Goals  Improve balance      10 :56-11:39  Therapeutic Exercise Octane: L7 6 min Quantam leg press 2x15 230#  Neuromuscular Re-education Heel raises x15 Toe taps x15 Tandem walking x6 lengths 1 LOB laterally self corrected Quiet stance blue foam 2x30 Tandem 2x30 blue foam Tandem walking x2 laps with one LOB with min A to correct; terminated after that Blue foam marching Blue foam HS curls Blue foam squats 2x10  PT Short Term Goals - 11/04/18 1122      PT SHORT TERM GOAL #1   Title  Pt will be independent with HEP in order to improve strength and balance in order to decrease fall risk and improve function at home and work.     Time  3    Period  Weeks    Status  Achieved        PT Long Term Goals - 11/04/18 1122      PT LONG TERM GOAL #1   Title  Pt will improve DGI by at least 3 points in order to demonstrate clinically significant improvement in balance and decreased risk for falls.    Baseline  09/01/18: 16/24; 10/05/18: 22/24; 11/04/18: 24/24    Time  6    Period  Weeks     Status  Achieved      PT LONG TERM GOAL #2   Title  Pt will be able to perform sit to stand without UE support in order to improve functional independence with transfers    Baseline  09/01/18: unable to perform; 10/05/18: patient able to perform from standard chair with UE reach; 11/04/18: 5TSTS in 17.1 seconds    Time  6    Period  Weeks    Status  Achieved      PT LONG TERM GOAL #3   Title  Pt will increase 10MWT by at least 0.13 m/s in order to demonstrate clinically significant improvement in community ambulation.    Baseline  09/01/18: self-selected: 14.4s = 0.69 m/s, Fastest: 12.7s = 0.79 m/sec; 10/05/18: self-selected: 12.2s= 0.75m/s fastest: 9.2s= 1.03 m/s; 11/04/18: self-selected: 12.2s= 0.37m/s fastest: 8.1s= 1.23 m/s    Time  6    Period  Weeks    Status  Achieved      PT LONG TERM GOAL #4   Title  Patient will demonstrate improved BLE strength and balance as evidenced by his ability to independently perform 5TSTS without UE support or UE reach from a standard height chair without LOB in order to decrease risk of falls at home and in the community.    Baseline  10/05/2018: UE reach, SBA/CGA for increased repetitions; 11/04/18: 17.1s     Time  4    Period  Weeks    Status  Achieved    Target Date  --      PT LONG TERM GOAL #5   Title  Patient will demonstrate step-through gait pattern independently when descending stairs without UE support to improve mobility in the community and optimize independence.    Baseline  10/05/2018: step-to with LUE support; 11/04/18: Able to perform without UE support with reciprocal pattern    Time  4    Period  Weeks    Status  Achieved      Additional Long Term Goals   Additional Long Term Goals  Yes      PT LONG TERM GOAL #6   Title  Pt will decrease 5TSTS by at least 3 seconds in order to demonstrate clinically significant improvement in LE strength     Baseline  09/01/18: unable to perform; 10/05/18: patient able to perform from  standard chair with UE reach; 11/04/18: 17.1 seconds    Time  8    Period  Weeks    Status  New    Target Date  12/30/18      PT LONG TERM GOAL #7   Title  Pt will be able to perform single leg  balance for greater than 12 seconds on each leg in order to improve balance and decrease his risk for falls    Baseline  11/04/18: 7-9 seconds on both LEs    Time  8    Period  Weeks    Status  New              Patient will benefit from skilled therapeutic intervention in order to improve the following deficits and impairments:     Visit Diagnosis: No diagnosis found.     Problem List Patient Active Problem List   Diagnosis Date Noted  . Dizziness 08/22/2018  . Gait instability 10/27/2016  . Essential hypertension 07/25/2014  . Atherosclerosis of abdominal aorta ([place]) 05/19/2013  . Abnormal EKG 08/09/2012  . Prostate cancer ([place]) 08/09/2012   Roxanne Gates, PT, DPT  Roxanne Gates 12/06/2018, 10:09 AM  [place] MAIN [facility] SERVICES [address] [place], [place], [address] Phone: [phone]   Fax:  [phone]  Name: Stuart Smith MRN: [id number] Date of Birth: [birth year]

## 2018-12-08 ENCOUNTER — Ambulatory Visit: Payer: Medicare Other

## 2018-12-08 DIAGNOSIS — M6281 Muscle weakness (generalized): Secondary | ICD-10-CM

## 2018-12-08 DIAGNOSIS — R2681 Unsteadiness on feet: Secondary | ICD-10-CM

## 2018-12-08 NOTE — Therapy (Signed)
[place] MAIN [facility] SERVICES [address] [place], [place], [address] Phone: [phone]   Fax:  [phone]  Physical Therapy Treatment  Patient Details  Name: Stuart Smith MRN: [id number] Date of Birth: 1927/09/27 Referring Provider (PT): Dr. Kary Kos   Encounter Date: 12/08/2018  PT End of Session - 12/08/18 1013    Visit Number  23    Number of Visits  32    Date for PT Re-Evaluation  12/30/18    Authorization Type  7/10,  Last goals: 11/04/18    PT Start Time  1016    PT Stop Time  1100    PT Time Calculation (min)  44 min    Equipment Utilized During Treatment  Gait belt    Activity Tolerance  Patient tolerated treatment well    Behavior During Therapy  [facility] for tasks assessed/performed       Past Medical History:  Diagnosis Date  . Cataracts, bilateral   . Depression   . Elevated PSA   . H/O seasonal allergies   . History of chickenpox   . Hypertension   . Prostate cancer [facility]) 2013   Surgery   . Torticollis     Past Surgical History:  Procedure Laterality Date  . BACK SURGERY  1995  . GALLBLADDER SURGERY    . PROSTATE SURGERY  2013    There were no vitals filed for this visit.  Subjective Assessment - 12/08/18 1021    Subjective  Patient reported that he had botox injections yesterday for torticollis/dystonia, and that he has some soreness in face/neck due to this, but overall is doing well.     Pertinent History  Pt reports that he was washing his head in the sink on 08/19/18 and had a severe bout of dizziness. He reports feeling "totally disoriented." Unclear if he had any vertigo. He states that after he brought his head back up the dizziness resolved. Then the following Sunday he went to stand up during communion at church and got very dizzy. He came to the [facility] ER and was worked up for a CVA. MRI negative for acute changes and carotid ultrasound showed less than 50% stenosis of the internal carotid arteries  bilaterally. Pt was kept overnight for observation and his dizziness persisted into the morning. Pt states that he was seen by physical therapist at the hospital who "threw me around" and it "helped a little bit." Pt was also treated with oral prednisone and meclizine and his symptoms improved. Pt reports that his symptoms are mostly resolved since that time with the exception of some unsteadiness. Pt denies any head trauma or MVA. PMH includes prostate cancer x 6 years for which he has been treated and has resulted in some weakness. Next Friday he will be having botox injections for his chronic torticollis. Prior history of low back surgery. Pt reports that ~1 year ago he had an episode of "haziness" during church, "then everything turned gray" and pt could not stand up. That cleared on it's own after going home and resting. Pt reports some "thickness" feeling on the underside of his feet.     Limitations  Walking    Diagnostic tests  MRI negative for acute changes and carotid ultrasound showed less than 50% stenosis of the internal carotid arteries bilaterally.    Patient Stated Goals  Improve balance       TREATMENT:   Therapeutic Exercise: Octane: L7 6 min Quantam leg press 2x15 230# (with  slight assist from PT to begin movement) GTB hip extension/abduction in standing 2x10 with UE support    Neuromuscular Re-education  Heel raises x20 Toe taps to second stair without UE support x10, first step x10  Feet apart blue foam 2x30 Tandem 2x30 blue foam Tandem walking x2 laps with one LOB with min A to correct; terminated after that Blue foam marching Blue foam HS curls Blue foam squats 2x10 1/2 foam roller x20 touching bar Mod tandem on blue foam alt feet in front 2x20sec hold bilaterally CGA FA on blue foam x20sec, with vertical head turns, with horiztonal head turns, no UE support, CGA for support    PT Education - 12/08/18 1021    Education Details  balance strategies, exercise  technique    Person(s) Educated  Patient    Methods  Explanation;Demonstration;Verbal cues    Comprehension  Verbalized understanding;Returned demonstration       PT Short Term Goals - 11/04/18 1122      PT SHORT TERM GOAL #1   Title  Pt will be independent with HEP in order to improve strength and balance in order to decrease fall risk and improve function at home and work.     Time  3    Period  Weeks    Status  Achieved        PT Long Term Goals - 11/04/18 1122      PT LONG TERM GOAL #1   Title  Pt will improve DGI by at least 3 points in order to demonstrate clinically significant improvement in balance and decreased risk for falls.    Baseline  09/01/18: 16/24; 10/05/18: 22/24; 11/04/18: 24/24    Time  6    Period  Weeks    Status  Achieved      PT LONG TERM GOAL #2   Title  Pt will be able to perform sit to stand without UE support in order to improve functional independence with transfers    Baseline  09/01/18: unable to perform; 10/05/18: patient able to perform from standard chair with UE reach; 11/04/18: 5TSTS in 17.1 seconds    Time  6    Period  Weeks    Status  Achieved      PT LONG TERM GOAL #3   Title  Pt will increase 10MWT by at least 0.13 m/s in order to demonstrate clinically significant improvement in community ambulation.    Baseline  09/01/18: self-selected: 14.4s = 0.69 m/s, Fastest: 12.7s = 0.79 m/sec; 10/05/18: self-selected: 12.2s= 0.22m/s fastest: 9.2s= 1.03 m/s; 11/04/18: self-selected: 12.2s= 0.59m/s fastest: 8.1s= 1.23 m/s    Time  6    Period  Weeks    Status  Achieved      PT LONG TERM GOAL #4   Title  Patient will demonstrate improved BLE strength and balance as evidenced by his ability to independently perform 5TSTS without UE support or UE reach from a standard height chair without LOB in order to decrease risk of falls at home and in the community.    Baseline  10/05/2018: UE reach, SBA/CGA for increased repetitions; 11/04/18: 17.1s      Time  4    Period  Weeks    Status  Achieved    Target Date  --      PT LONG TERM GOAL #5   Title  Patient will demonstrate step-through gait pattern independently when descending stairs without UE support to improve mobility in the community and optimize independence.  Baseline  10/05/2018: step-to with LUE support; 11/04/18: Able to perform without UE support with reciprocal pattern    Time  4    Period  Weeks    Status  Achieved      Additional Long Term Goals   Additional Long Term Goals  Yes      PT LONG TERM GOAL #6   Title  Pt will decrease 5TSTS by at least 3 seconds in order to demonstrate clinically significant improvement in LE strength     Baseline  09/01/18: unable to perform; 10/05/18: patient able to perform from standard chair with UE reach; 11/04/18: 17.1 seconds    Time  8    Period  Weeks    Status  New    Target Date  12/30/18      PT LONG TERM GOAL #7   Title  Pt will be able to perform single leg balance for greater than 12 seconds on each leg in order to improve balance and decrease his risk for falls    Baseline  11/04/18: 7-9 seconds on both LEs    Time  [place] - 12/08/18 1218    Clinical Impression Statement  Patient with good response to treatment today. Pt most challenged by higher level dynamic balance on uneven surfaces, no significant LOB noted this session. Pt motivated to participate, 1-2 sitting rest breaks needed. Overall the patient would benefit from further skilled PT to continue to progress towards goals.    Rehab Potential  Good    Clinical Impairments Affecting Rehab Potential  (+) symptoms have resolved    PT Frequency  2x / week    PT Duration  8 weeks    PT Treatment/Interventions  ADLs/Self Care Home Management;Aquatic Therapy;Canalith Repostioning;Cryotherapy;Electrical Stimulation;Iontophoresis 4mg /ml Dexamethasone;Moist Heat;Traction;Ultrasound;DME Instruction;Gait training;Stair  training;Functional mobility training;Therapeutic activities;Therapeutic exercise;Balance training;Neuromuscular re-education;Cognitive remediation;Patient/family education;Manual techniques;Passive range of motion;Dry needling;Vestibular    PT Next Visit Plan  goals, progress strength and balance    PT Home Exercise Plan  Sit to stand without UE support, mini squast, tandem and single leg balance;    Consulted and Agree with Plan of Care  Patient       Patient will benefit from skilled therapeutic intervention in order to improve the following deficits and impairments:  Abnormal gait, Decreased balance, Decreased strength, Difficulty walking  Visit Diagnosis: Muscle weakness (generalized)  Unsteadiness on feet     Problem List Patient Active Problem List   Diagnosis Date Noted  . Dizziness 08/22/2018  . Gait instability 10/27/2016  . Essential hypertension 07/25/2014  . Atherosclerosis of abdominal aorta ([place]) 05/19/2013  . Abnormal EKG 08/09/2012  . Prostate cancer [facility]) 08/09/2012     Lieutenant Diego PT, DPT 12:20 PM,12/08/18 [place] MAIN [facility] SERVICES [address] [place], [place], [address] Phone: [phone]   Fax:  [phone]  Name: Stuart Smith MRN: [id number] Date of Birth: Dec 25, 1926

## 2018-12-14 ENCOUNTER — Ambulatory Visit: Payer: Medicare Other

## 2018-12-16 ENCOUNTER — Ambulatory Visit: Payer: Medicare Other

## 2018-12-16 VITALS — BP 151/85 | HR 88

## 2018-12-16 DIAGNOSIS — M6281 Muscle weakness (generalized): Secondary | ICD-10-CM

## 2018-12-16 DIAGNOSIS — R2681 Unsteadiness on feet: Secondary | ICD-10-CM

## 2018-12-16 NOTE — Therapy (Signed)
[place] MAIN [facility] SERVICES [address] [place], [place], [address] Phone: [phone]   Fax:  [phone]  Physical Therapy Treatment/Goal Update   Patient Details  Name: Stuart Smith MRN: [id number] Date of Birth: 24-Nov-1926 Referring Provider (PT): Dr. Kary Kos   Encounter Date: 12/16/2018  PT End of Session - 12/16/18 0808    Visit Number  24    Number of Visits  32    Date for PT Re-Evaluation  12/30/18    Authorization Type  Last goals: 12/16/18    PT Start Time  0803    PT Stop Time  0850    PT Time Calculation (min)  47 min    Equipment Utilized During Treatment  Gait belt    Activity Tolerance  Patient tolerated treatment well    Behavior During Therapy  [facility] for tasks assessed/performed       Past Medical History:  Diagnosis Date  . Cataracts, bilateral   . Depression   . Elevated PSA   . H/O seasonal allergies   . History of chickenpox   . Hypertension   . Prostate cancer [facility]) 2013   Surgery   . Torticollis     Past Surgical History:  Procedure Laterality Date  . BACK SURGERY  1995  . GALLBLADDER SURGERY    . PROSTATE SURGERY  2013    Vitals:   12/16/18 0832  BP: (!) 151/85  Pulse: 88  SpO2: 98%    Subjective Assessment - 12/16/18 0808    Subjective  Pt states that he is doing well today. He denies any pain currently. Pt reports that he saw his opthamologist who diagnosed him with glaucoma. He has been using drops and his MD was pleased at his follow-up at the improvement in pressure achieved. No specific questions or concerns at this time.     Pertinent History  Pt reports that he was washing his head in the sink on 08/19/18 and had a severe bout of dizziness. He reports feeling "totally disoriented." Unclear if he had any vertigo. He states that after he brought his head back up the dizziness resolved. Then the following Sunday he went to stand up during communion at church and got very dizzy. He came to  the [facility] ER and was worked up for a CVA. MRI negative for acute changes and carotid ultrasound showed less than 50% stenosis of the internal carotid arteries bilaterally. Pt was kept overnight for observation and his dizziness persisted into the morning. Pt states that he was seen by physical therapist at the hospital who "threw me around" and it "helped a little bit." Pt was also treated with oral prednisone and meclizine and his symptoms improved. Pt reports that his symptoms are mostly resolved since that time with the exception of some unsteadiness. Pt denies any head trauma or MVA. PMH includes prostate cancer x 6 years for which he has been treated and has resulted in some weakness. Next Friday he will be having botox injections for his chronic torticollis. Prior history of low back surgery. Pt reports that ~1 year ago he had an episode of "haziness" during church, "then everything turned gray" and pt could not stand up. That cleared on it's own after going home and resting. Pt reports some "thickness" feeling on the underside of his feet.     Limitations  Walking    Diagnostic tests  MRI negative for acute changes and carotid ultrasound showed less than 50% stenosis of  the internal carotid arteries bilaterally.    Patient Stated Goals  Improve balance    Currently in Pain?  No/denies           [facility] PT Assessment - 12/16/18 0001      Observation/Other Assessments   Other Surveys   Other Surveys    Activities of Balance Confidence Scale (ABC Scale)   59.25%      6 Minute Walk- Baseline   6 Minute Walk- Baseline  yes    BP (mmHg)  151/85    HR (bpm)  88    02 Sat (%RA)  98 %    Modified Borg Scale for Dyspnea  0- Nothing at all    Perceived Rate of Exertion (Borg)  6-      6 Minute walk- Post Test   6 Minute Walk Post Test  yes    BP (mmHg)  173/71    HR (bpm)  107    02 Sat (%RA)  99 %      6 minute walk test results    Aerobic Endurance Distance Walked  1090      Standardized  Balance Assessment   Standardized Balance Assessment  Five Times Sit to Stand    Five times sit to stand comments   15.5s         TREATMENT   Ther-ex Octane HIIT L6x 30s, L3x 60s for 5 minutes with 1 minute cool down during history. Fatigue monitored(28mnutes unbilled); Updated outcome measures with patient including 5TSTS (15.5s), 30s sit to stand test (8.5), miniBEST (20/28), 6MWT (1090') Pt completed ABC: 59.25% (unbilled)    Pt educated throughout session about proper posture and technique with exercises. Improved exercise technique, movement at target joints, use of target muscles after min to mod verbal, visual, tactile cues.   Patient demonstrates improvements in his outcome measures since starting therapy. His strength and balance have improved considerably. At initial evaluation on 09/01/18 pt was unable to stand from a chair without heavy reliance on his arms to push off the arm rests. Today he is able to complete a Five Time Sit to Stand test without his arms in 15.5 seconds which is decreased from 17.1 seconds when last checked. Additional outcome measures performed as pt had already met the ceiling for DGI and BERG. Today pt scored a 20/28 on the miniBEST and ambulated 1090' during the 6MWT. He was able to stand up 8.5 times during the 30s sit to stand test.  Patient's condition has the potential to improve in response to therapy. Maximum improvement is yet to be obtained. The anticipated improvement is attainable and reasonable in a generally predictable time. Patient will continue to benefit from skilled therapeutic intervention to optimize independence and improve function                          PT Short Term Goals - 12/16/18 0809      PT SHORT TERM GOAL #1   Title  Pt will be independent with HEP in order to improve strength and balance in order to decrease fall risk and improve function at home and work.     Time  3    Period  Weeks     Status  Achieved        PT Long Term Goals - 12/16/18 0809      PT LONG TERM GOAL #1   Title  Pt will improve DGI by at least 3 points  in order to demonstrate clinically significant improvement in balance and decreased risk for falls.    Baseline  09/01/18: 16/24; 10/05/18: 22/24; 11/04/18: 24/24    Time  6    Period  Weeks    Status  Achieved      PT LONG TERM GOAL #2   Title  Pt will be able to perform sit to stand without UE support in order to improve functional independence with transfers    Baseline  09/01/18: unable to perform; 10/05/18: patient able to perform from standard chair with UE reach; 11/04/18: 5TSTS in 17.1 seconds    Time  6    Period  Weeks    Status  Achieved      PT LONG TERM GOAL #3   Title  Pt will increase 10MWT by at least 0.13 m/s in order to demonstrate clinically significant improvement in community ambulation.    Baseline  09/01/18: self-selected: 14.4s = 0.69 m/s, Fastest: 12.7s = 0.79 m/sec; 10/05/18: self-selected: 12.2s= 0.68ms fastest: 9.2s= 1.03 m/s; 11/04/18: self-selected: 12.2s= 0.845m fastest: 8.1s= 1.23 m/s    Time  6    Period  Weeks    Status  Achieved      PT LONG TERM GOAL #4   Title  Patient will demonstrate improved BLE strength and balance as evidenced by his ability to independently perform 5TSTS without UE support or UE reach from a standard height chair without LOB in order to decrease risk of falls at home and in the community.    Baseline  10/05/2018: UE reach, SBA/CGA for increased repetitions; 11/04/18: 17.1s     Time  4    Period  Weeks    Status  Achieved      PT LONG TERM GOAL #5   Title  Patient will demonstrate step-through gait pattern independently when descending stairs without UE support to improve mobility in the community and optimize independence.    Baseline  10/05/2018: step-to with LUE support; 11/04/18: Able to perform without UE support with reciprocal pattern    Time  4    Period  Weeks    Status   Achieved      Additional Long Term Goals   Additional Long Term Goals  Yes      PT LONG TERM GOAL #6   Title  Pt will decrease 5TSTS by at least 3 seconds in order to demonstrate clinically significant improvement in LE strength     Baseline  09/01/18: unable to perform; 10/05/18: patient able to perform from standard chair with UE reach; 11/04/18: 17.1 seconds; 12/16/18: 15.5s    Time  8    Period  Weeks    Status  Partially Met    Target Date  12/30/18      PT LONG TERM GOAL #7   Title  Pt will be able to perform single leg balance for greater than 12 seconds on each leg in order to improve balance and decrease his risk for falls    Baseline  11/04/18: 7-9 seconds on both LEs; 12/16/18: LLE: 9.4s average; RLE: 5.2s average    Time  8    Period  Weeks    Status  Partially Met    Target Date  12/30/18      PT LONG TERM GOAL #8   Title  Pt will improve ABC by at least 13% in order to demonstrate clinically significant improvement in balance confidence.     Baseline  12/16/18: 59.25%    Time  12    Period  Weeks    Status  New    Target Date  03/10/19      PT LONG TERM GOAL  #9   TITLE  Pt will increase 6MWT by at least 56m(1664f in order to demonstrate clinically significant improvement in cardiopulmonary endurance and community ambulation     Baseline  12/16/18: 1090'    Time  12    Period  Weeks    Status  New    Target Date  03/10/19      PT LONG TERM GOAL  #10   TITLE  Pt will improve FGA by at least 4 points in order to demonstrate clinically significant improvement in balance and decrease in fall risk    Baseline  12/16/18: 20/28    Time  12    Period  Weeks    Status  New    Target Date  03/10/19            Plan - 12/16/18 0809    Clinical Impression Statement  Patient demonstrates improvements in his outcome measures since starting therapy. His strength and balance have improved considerably. At initial evaluation on 09/01/18 pt was unable to stand from a  chair without heavy reliance on his arms to push off the arm rests. Today he is able to complete a Five Time Sit to Stand test without his arms in 15.5 seconds which is decreased from 17.1 seconds when last checked. Additional outcome measures performed as pt had already met the ceiling for DGI and BERG. Today pt scored a 20/28 on the miniBEST and ambulated 1090' during the 6MWT. He was able to stand up 8.5 times during the 30s sit to stand test.  Patient's condition has the potential to improve in response to therapy. Maximum improvement is yet to be obtained. The anticipated improvement is attainable and reasonable in a generally predictable time. Patient will continue to benefit from skilled therapeutic intervention to optimize independence and improve function    Rehab Potential  Good    Clinical Impairments Affecting Rehab Potential  (+) symptoms have resolved    PT Frequency  2x / week    PT Duration  8 weeks    PT Treatment/Interventions  ADLs/Self Care Home Management;Aquatic Therapy;Canalith Repostioning;Cryotherapy;Electrical Stimulation;Iontophoresis 62m33ml Dexamethasone;Moist Heat;Traction;Ultrasound;DME Instruction;Gait training;Stair training;Functional mobility training;Therapeutic activities;Therapeutic exercise;Balance training;Neuromuscular re-education;Cognitive remediation;Patient/family education;Manual techniques;Passive range of motion;Dry needling;Vestibular    PT Next Visit Plan  progress strength and balance    PT Home Exercise Plan  Sit to stand without UE support, mini squast, tandem and single leg balance;    Consulted and Agree with Plan of Care  Patient       Patient will benefit from skilled therapeutic intervention in order to improve the following deficits and impairments:  Abnormal gait, Decreased balance, Decreased strength, Difficulty walking  Visit Diagnosis: Muscle weakness (generalized)  Unsteadiness on feet     Problem List Patient Active Problem List    Diagnosis Date Noted  . Dizziness 08/22/2018  . Gait instability 10/27/2016  . Essential hypertension 07/25/2014  . Atherosclerosis of abdominal [place] ([place]/01/2013  . Abnormal EKG 08/09/2012  . [place] cancer ([place]   JasPhillips Grout, DPT, GCS  Twala Collings 12/16/2018, 3:29 PM  [place] [facility]ICES [address] [place]one: [phone]:  [phone]: EugLEWIS KEATSN: [id number] of Birth: 10/1927/06/25

## 2018-12-20 ENCOUNTER — Ambulatory Visit: Payer: Medicare Other | Attending: Family Medicine

## 2018-12-20 DIAGNOSIS — R2681 Unsteadiness on feet: Secondary | ICD-10-CM | POA: Diagnosis present

## 2018-12-20 DIAGNOSIS — M6281 Muscle weakness (generalized): Secondary | ICD-10-CM | POA: Insufficient documentation

## 2018-12-20 NOTE — Therapy (Signed)
[place] MAIN [facility] SERVICES [address] [place], [place], [address] Phone: [phone]   Fax:  [phone]  Physical Therapy Treatment  Patient Details  Name: Stuart Smith MRN: [id number] Date of Birth: Oct 13, 1927 Referring Provider (PT): Dr. Kary Kos   Encounter Date: 12/20/2018  PT End of Session - 12/20/18 1052    Visit Number  25    Number of Visits  32    Date for PT Re-Evaluation  12/30/18    Authorization Type  Last goals: 12/16/18    PT Start Time  0840    PT Stop Time  0930    PT Time Calculation (min)  50 min    Equipment Utilized During Treatment  Gait belt    Activity Tolerance  Patient tolerated treatment well    Behavior During Therapy  [facility] for tasks assessed/performed       Past Medical History:  Diagnosis Date  . Cataracts, bilateral   . Depression   . Elevated PSA   . H/O seasonal allergies   . History of chickenpox   . Hypertension   . Prostate cancer [facility]) 2013   Surgery   . Torticollis     Past Surgical History:  Procedure Laterality Date  . BACK SURGERY  1995  . GALLBLADDER SURGERY    . PROSTATE SURGERY  2013    There were no vitals filed for this visit.  Subjective Assessment - 12/20/18 0834    Subjective  Pt states that he is doing well today. He denies any pain currently. No specific questions or concerns at this time.     Pertinent History  Pt reports that he was washing his head in the sink on 08/19/18 and had a severe bout of dizziness. He reports feeling "totally disoriented." Unclear if he had any vertigo. He states that after he brought his head back up the dizziness resolved. Then the following Sunday he went to stand up during communion at church and got very dizzy. He came to the [facility] ER and was worked up for a CVA. MRI negative for acute changes and carotid ultrasound showed less than 50% stenosis of the internal carotid arteries bilaterally. Pt was kept overnight for observation and his  dizziness persisted into the morning. Pt states that he was seen by physical therapist at the hospital who "threw me around" and it "helped a little bit." Pt was also treated with oral prednisone and meclizine and his symptoms improved. Pt reports that his symptoms are mostly resolved since that time with the exception of some unsteadiness. Pt denies any head trauma or MVA. PMH includes prostate cancer x 6 years for which he has been treated and has resulted in some weakness. Next Friday he will be having botox injections for his chronic torticollis. Prior history of low back surgery. Pt reports that ~1 year ago he had an episode of "haziness" during church, "then everything turned gray" and pt could not stand up. That cleared on it's own after going home and resting. Pt reports some "thickness" feeling on the underside of his feet.     Limitations  Walking    Diagnostic tests  MRI negative for acute changes and carotid ultrasound showed less than 50% stenosis of the internal carotid arteries bilaterally.    Patient Stated Goals  Improve balance    Currently in Pain?  No/denies               TREATMENT   Ther-ex Octane HIIT  L10x 30s, L5x 60s for 10 minutes with 2 minute cool down. Fatigue monitored and history obtained(61mnutes unbilled); Split squats with 2 Airex pads for rear knee tap x 10 on each side with UE support on // bars, improved strength noted today; Sit to stand without UE support from regular height chair performing as fast as possible to improve power, cues for increased anterior weight shiftingx 10; Practiced floor to stand transfers with red mat. Practiced going from sitting to quadruped and then half kneeling. Pt uses ipsilateral hand on forward knee and uses contralateral UE support on mat table. Attempted without mat table support however pt is unable to perform. Practiced transfer with patient multiple times;   Neuromuscular Re-education 1/2 foam roll tandem  balance alternating LE 30s x 2 each; Incline/decline balance with eyes closed 30s x 3 incline and 30s decline; Seated rapid alternating toe taps to 4" step to work on speed for reaction time x 15 bilateral; Standing rapid alternating toe taps to 4" step to work on speed for reaction time x 15 bilateral; Standing rapid lateral toe taps to 2" foam pad to work on speed for reaction time x 15 bilateral; Worked on reaction time stepping strategy with pt leaning backwards and then laterally each direction into therapist with therapist removing support and encouraging step to maintain balance;   Pt educated throughout session about proper posture and technique with exercises. Improved exercise technique, movement at target joints, use of target muscles after min to mod verbal, visual, tactile cues.   Pt continues to be able to demonstrate excellent motivation during session. He demonstrates significant improvement in strength with split squats. Practiced floor to stand transfers with red mat. Practiced going from sitting to quadruped and then half kneeling and finally standing. Pt requires UE assist from mat table and is too weak to perform without external support when attempted. Will continue to progress HEP as appropriate in future sessions as well as advancing strengthening and balance exercises.Pt will benefit from PT services to address deficits in strength, balance, and mobility in order to return to full function at home.                     PT Short Term Goals - 12/16/18 0809      PT SHORT TERM GOAL #1   Title  Pt will be independent with HEP in order to improve strength and balance in order to decrease fall risk and improve function at home and work.     Time  3    Period  Weeks    Status  Achieved        PT Long Term Goals - 12/16/18 0809      PT LONG TERM GOAL #1   Title  Pt will improve DGI by at least 3 points in order to demonstrate clinically significant  improvement in balance and decreased risk for falls.    Baseline  09/01/18: 16/24; 10/05/18: 22/24; 11/04/18: 24/24    Time  6    Period  Weeks    Status  Achieved      PT LONG TERM GOAL #2   Title  Pt will be able to perform sit to stand without UE support in order to improve functional independence with transfers    Baseline  09/01/18: unable to perform; 10/05/18: patient able to perform from standard chair with UE reach; 11/04/18: 5TSTS in 17.1 seconds    Time  6    Period  Weeks  Status  Achieved      PT LONG TERM GOAL #3   Title  Pt will increase 10MWT by at least 0.13 m/s in order to demonstrate clinically significant improvement in community ambulation.    Baseline  09/01/18: self-selected: 14.4s = 0.69 m/s, Fastest: 12.7s = 0.79 m/sec; 10/05/18: self-selected: 12.2s= 0.6ms fastest: 9.2s= 1.03 m/s; 11/04/18: self-selected: 12.2s= 0.861m fastest: 8.1s= 1.23 m/s    Time  6    Period  Weeks    Status  Achieved      PT LONG TERM GOAL #4   Title  Patient will demonstrate improved BLE strength and balance as evidenced by his ability to independently perform 5TSTS without UE support or UE reach from a standard height chair without LOB in order to decrease risk of falls at home and in the community.    Baseline  10/05/2018: UE reach, SBA/CGA for increased repetitions; 11/04/18: 17.1s     Time  4    Period  Weeks    Status  Achieved      PT LONG TERM GOAL #5   Title  Patient will demonstrate step-through gait pattern independently when descending stairs without UE support to improve mobility in the community and optimize independence.    Baseline  10/05/2018: step-to with LUE support; 11/04/18: Able to perform without UE support with reciprocal pattern    Time  4    Period  Weeks    Status  Achieved      Additional Long Term Goals   Additional Long Term Goals  Yes      PT LONG TERM GOAL #6   Title  Pt will decrease 5TSTS by at least 3 seconds in order to demonstrate  clinically significant improvement in LE strength     Baseline  09/01/18: unable to perform; 10/05/18: patient able to perform from standard chair with UE reach; 11/04/18: 17.1 seconds; 12/16/18: 15.5s    Time  8    Period  Weeks    Status  Partially Met    Target Date  12/30/18      PT LONG TERM GOAL #7   Title  Pt will be able to perform single leg balance for greater than 12 seconds on each leg in order to improve balance and decrease his risk for falls    Baseline  11/04/18: 7-9 seconds on both LEs; 12/16/18: LLE: 9.4s average; RLE: 5.2s average    Time  8    Period  Weeks    Status  Partially Met    Target Date  12/30/18      PT LONG TERM GOAL #8   Title  Pt will improve ABC by at least 13% in order to demonstrate clinically significant improvement in balance confidence.     Baseline  12/16/18: 59.25%    Time  12    Period  Weeks    Status  New    Target Date  03/10/19      PT LONG TERM GOAL  #9   TITLE  Pt will increase 6MWT by at least [id number] order to demonstrate clinically significant improvement in cardiopulmonary endurance and community ambulation     Baseline  12/16/18: 1090'    Time  12    Period  Weeks    Status  New    Target Date  03/10/19      PT LONG TERM GOAL  #10   TITLE  Pt will improve FGA by at least 4 points in order  to demonstrate clinically significant improvement in balance and decrease in fall risk    Baseline  12/16/18: 20/28    Time  12    Period  Weeks    Status  New    Target Date  03/10/19            Plan - 12/20/18 1052    Clinical Impression Statement  Pt continues to be able to demonstrate excellent motivation during session. He demonstrates significant improvement in strength with split squats. Practiced floor to stand transfers with red mat. Practiced going from sitting to quadruped and then half kneeling and finally standing. Pt requires UE assist from mat table and is too weak to perform without external support when attempted.  Will continue to progress HEP as appropriate in future sessions as well as advancing strengthening and balance exercises.Pt will benefit from PT services to address deficits in strength, balance, and mobility in order to return to full function at home.    Rehab Potential  Good    Clinical Impairments Affecting Rehab Potential  (+) symptoms have resolved    PT Frequency  2x / week    PT Duration  8 weeks    PT Treatment/Interventions  ADLs/Self Care Home Management;Aquatic Therapy;Canalith Repostioning;Cryotherapy;Electrical Stimulation;Iontophoresis 39m/ml Dexamethasone;Moist Heat;Traction;Ultrasound;DME Instruction;Gait training;Stair training;Functional mobility training;Therapeutic activities;Therapeutic exercise;Balance training;Neuromuscular re-education;Cognitive remediation;Patient/family education;Manual techniques;Passive range of motion;Dry needling;Vestibular    PT Next Visit Plan  progress strength and balance    PT Home Exercise Plan  Sit to stand without UE support, mini squast, tandem and single leg balance;    Consulted and Agree with Plan of Care  Patient       Patient will benefit from skilled therapeutic intervention in order to improve the following deficits and impairments:  Abnormal gait, Decreased balance, Decreased strength, Difficulty walking  Visit Diagnosis: Muscle weakness (generalized)  Unsteadiness on feet     Problem List Patient Active Problem List   Diagnosis Date Noted  . Dizziness 08/22/2018  . Gait instability 10/27/2016  . Essential hypertension 07/25/2014  . Atherosclerosis of abdominal aorta ([place]2013  . Abnormal EKG 08/09/2012  . Prostate cancer ([place] 08/09/2012   JPhillips GroutPT, DPT, GCS  Huprich,Jason 12/20/2018, 10:58 AM  [place]MAIN RRockledge [facility] CenterSERVICES [address]RTamar[address] [place] [address]Phone: [phone]  Fax:  [phone] Name: Stuart TOZZIMRN: [id number] of Birth:  109/13/1928

## 2018-12-22 ENCOUNTER — Ambulatory Visit: Payer: Medicare Other

## 2018-12-22 VITALS — BP 138/72 | HR 87

## 2018-12-22 DIAGNOSIS — R2681 Unsteadiness on feet: Secondary | ICD-10-CM

## 2018-12-22 DIAGNOSIS — M6281 Muscle weakness (generalized): Secondary | ICD-10-CM | POA: Diagnosis not present

## 2018-12-22 NOTE — Therapy (Signed)
[place] MAIN [facility] SERVICES [address] [place], [place], [address] Phone: [phone]   Fax:  [phone]  Physical Therapy Treatment/Recertification   Patient Details  Name: Stuart Smith MRN: [id number] Date of Birth: 1927/07/13 Referring Provider (PT): Dr. Kary Kos   Encounter Date: 12/22/2018  PT End of Session - 12/22/18 0845    Visit Number  26    Number of Visits  56    Date for PT Re-Evaluation  03/16/19    Authorization Type  Last goals: 12/16/18    PT Start Time  0840    PT Stop Time  0930    PT Time Calculation (min)  50 min    Equipment Utilized During Treatment  Gait belt    Activity Tolerance  Patient tolerated treatment well    Behavior During Therapy  [facility] for tasks assessed/performed       Past Medical History:  Diagnosis Date  . Cataracts, bilateral   . Depression   . Elevated PSA   . H/O seasonal allergies   . History of chickenpox   . Hypertension   . Prostate cancer [facility]) 2013   Surgery   . Torticollis     Past Surgical History:  Procedure Laterality Date  . BACK SURGERY  1995  . GALLBLADDER SURGERY    . PROSTATE SURGERY  2013    Vitals:   12/22/18 0843  BP: 138/72  Pulse: 87  SpO2: 96%    Subjective Assessment - 12/22/18 0843    Subjective  Pt states that he is doing well today. He denies any pain currently. No specific questions or concerns at this time just reports that he is moving a little slow this morning    Pertinent History  Pt reports that he was washing his head in the sink on 08/19/18 and had a severe bout of dizziness. He reports feeling "totally disoriented." Unclear if he had any vertigo. He states that after he brought his head back up the dizziness resolved. Then the following Sunday he went to stand up during communion at church and got very dizzy. He came to the [facility] ER and was worked up for a CVA. MRI negative for acute changes and carotid ultrasound showed less than 50% stenosis  of the internal carotid arteries bilaterally. Pt was kept overnight for observation and his dizziness persisted into the morning. Pt states that he was seen by physical therapist at the hospital who "threw me around" and it "helped a little bit." Pt was also treated with oral prednisone and meclizine and his symptoms improved. Pt reports that his symptoms are mostly resolved since that time with the exception of some unsteadiness. Pt denies any head trauma or MVA. PMH includes prostate cancer x 6 years for which he has been treated and has resulted in some weakness. Next Friday he will be having botox injections for his chronic torticollis. Prior history of low back surgery. Pt reports that ~1 year ago he had an episode of "haziness" during church, "then everything turned gray" and pt could not stand up. That cleared on it's own after going home and resting. Pt reports some "thickness" feeling on the underside of his feet.     Limitations  Walking    Diagnostic tests  MRI negative for acute changes and carotid ultrasound showed less than 50% stenosis of the internal carotid arteries bilaterally.    Patient Stated Goals  Improve balance    Currently in Pain?  No/denies  TREATMENT   Ther-ex Octane HIIT L8-11x 30s, L4-5x60s for 8 minutes with 2 minute cool down. Worked in 50-60% Heart Rate Intensity Zone (106-111) based on Karvonen Formula as well as around 6-7/10 on RPE (0-10). Fatigue monitored, resisted adjusted, and history obtained(48mnutes unbilled); Quantam leg press 225# x 20, 240# x 12  (with assist from PT to begin movement) Sit to stand without UE support from regular height chair with Airex pad under feet x 10; Split squats without UE support as low as possible for patient x 10 bilateral;   Neuromuscular Re-education Forward/backward tandem gait in // bars x 2 lengths each; 1/2 foam roll (flat side up) tandem balance alternating LE 30s x 2 each; Incline  balance with eyes closed 30s x 3 incline and 30s decline; Incline balance with eyes open horizontal and vertical head turns 30s x 3 each Standing rapid alternating toe taps to 6" step to work on speed for reaction time x 15 bilateral;   Pt educated throughout session about proper posture and technique with exercises. Improved exercise technique, movement at target joints, use of target muscles after min to mod verbal, visual, tactile cues.   Pt continues to be able to demonstrate excellent motivation during session. He demonstrates improvements in his outcome measures since starting therapy. His strength and balance have improved considerably. At initial evaluation on 09/01/18 pt was unable to stand from a chair without heavy reliance on his arms to push off the arm rests. On 12/16/18 he is able to complete a Five Time Sit to Stand test without his arms in 15.5 seconds. Additional outcome measures were performed as pt had already maximized his score on the DGI and BERG. On 12/16/18 pt scored a 20/28 on the miniBEST and ambulated 1090' during the 6MWT. He was able to stand up 8.5 times during the 30s sit to stand test. The most notable deficits in these outcome measures are endurance, reaction time, and single leg balance. Patient's condition has the potential to improve in response to therapy. Maximum improvement is yet to be obtained. The anticipated improvement is attainable and reasonable in a generally predictable time. Patient will continue to benefit from skilled therapeutic intervention to optimize independence and improve function  Practiced floor to stand transfers with red mat. Pt will benefit from PT services to address deficits in strength, balance, and mobility in order to return to full function at home.                       PT Short Term Goals - 12/16/18 0809      PT SHORT TERM GOAL #1   Title  Pt will be independent with HEP in order to improve strength and  balance in order to decrease fall risk and improve function at home and work.     Time  3    Period  Weeks    Status  Achieved        PT Long Term Goals - 12/16/18 0809      PT LONG TERM GOAL #1   Title  Pt will improve DGI by at least 3 points in order to demonstrate clinically significant improvement in balance and decreased risk for falls.    Baseline  09/01/18: 16/24; 10/05/18: 22/24; 11/04/18: 24/24    Time  6    Period  Weeks    Status  Achieved      PT LONG TERM GOAL #2   Title  Pt will be able  to perform sit to stand without UE support in order to improve functional independence with transfers    Baseline  09/01/18: unable to perform; 10/05/18: patient able to perform from standard chair with UE reach; 11/04/18: 5TSTS in 17.1 seconds    Time  6    Period  Weeks    Status  Achieved      PT LONG TERM GOAL #3   Title  Pt will increase 10MWT by at least 0.13 m/s in order to demonstrate clinically significant improvement in community ambulation.    Baseline  09/01/18: self-selected: 14.4s = 0.69 m/s, Fastest: 12.7s = 0.79 m/sec; 10/05/18: self-selected: 12.2s= 0.15ms fastest: 9.2s= 1.03 m/s; 11/04/18: self-selected: 12.2s= 0.836m fastest: 8.1s= 1.23 m/s    Time  6    Period  Weeks    Status  Achieved      PT LONG TERM GOAL #4   Title  Patient will demonstrate improved BLE strength and balance as evidenced by his ability to independently perform 5TSTS without UE support or UE reach from a standard height chair without LOB in order to decrease risk of falls at home and in the community.    Baseline  10/05/2018: UE reach, SBA/CGA for increased repetitions; 11/04/18: 17.1s     Time  4    Period  Weeks    Status  Achieved      PT LONG TERM GOAL #5   Title  Patient will demonstrate step-through gait pattern independently when descending stairs without UE support to improve mobility in the community and optimize independence.    Baseline  10/05/2018: step-to with LUE support;  11/04/18: Able to perform without UE support with reciprocal pattern    Time  4    Period  Weeks    Status  Achieved      Additional Long Term Goals   Additional Long Term Goals  Yes      PT LONG TERM GOAL #6   Title  Pt will decrease 5TSTS by at least 3 seconds in order to demonstrate clinically significant improvement in LE strength     Baseline  09/01/18: unable to perform; 10/05/18: patient able to perform from standard chair with UE reach; 11/04/18: 17.1 seconds; 12/16/18: 15.5s    Time  8    Period  Weeks    Status  Partially Met    Target Date  12/30/18      PT LONG TERM GOAL #7   Title  Pt will be able to perform single leg balance for greater than 12 seconds on each leg in order to improve balance and decrease his risk for falls    Baseline  11/04/18: 7-9 seconds on both LEs; 12/16/18: LLE: 9.4s average; RLE: 5.2s average    Time  8    Period  Weeks    Status  Partially Met    Target Date  12/30/18      PT LONG TERM GOAL #8   Title  Pt will improve ABC by at least 13% in order to demonstrate clinically significant improvement in balance confidence.     Baseline  12/16/18: 59.25%    Time  12    Period  Weeks    Status  New    Target Date  03/10/19      PT LONG TERM GOAL  #9   TITLE  Pt will increase 6MWT by at least [id number] order to demonstrate clinically significant improvement in cardiopulmonary endurance and community ambulation  Baseline  12/16/18: 1090'    Time  12    Period  Weeks    Status  New    Target Date  03/10/19      PT LONG TERM GOAL  #10   TITLE  Pt will improve FGA by at least 4 points in order to demonstrate clinically significant improvement in balance and decrease in fall risk    Baseline  12/16/18: 20/28    Time  12    Period  Weeks    Status  New    Target Date  03/10/19            Plan - 12/22/18 0846    Clinical Impression Statement  Pt continues to be able to demonstrate excellent motivation during session. He demonstrates  improvements in his outcome measures since starting therapy. His strength and balance have improved considerably. At initial evaluation on 09/01/18 pt was unable to stand from a chair without heavy reliance on his arms to push off the arm rests. On 12/16/18 he is able to complete a Five Time Sit to Stand test without his arms in 15.5 seconds. Additional outcome measures were performed as pt had already maximized his score on the DGI and BERG. On 12/16/18 pt scored a 20/28 on the miniBEST and ambulated 1090' during the 6MWT. He was able to stand up 8.5 times during the 30s sit to stand test. The most notable deficits in these outcome measures are endurance, reaction time, and single leg balance. Patient's condition has the potential to improve in response to therapy. Maximum improvement is yet to be obtained. The anticipated improvement is attainable and reasonable in a generally predictable time. Patient will continue to benefit from skilled therapeutic intervention to optimize independence and improve function  Practiced floor to stand transfers with red mat. Pt will benefit from PT services to address deficits in strength, balance, and mobility in order to return to full function at home.    Rehab Potential  Good    Clinical Impairments Affecting Rehab Potential  (+) symptoms have resolved    PT Frequency  2x / week    PT Duration  12 weeks    PT Treatment/Interventions  ADLs/Self Care Home Management;Aquatic Therapy;Canalith Repostioning;Cryotherapy;Electrical Stimulation;Iontophoresis 31m/ml Dexamethasone;Moist Heat;Traction;Ultrasound;DME Instruction;Gait training;Stair training;Functional mobility training;Therapeutic activities;Therapeutic exercise;Balance training;Neuromuscular re-education;Cognitive remediation;Patient/family education;Manual techniques;Passive range of motion;Dry needling;Vestibular    PT Next Visit Plan  progress strength and balance, work on reaction time, single leg balance, and  endurance    PT Home Exercise Plan  Sit to stand without UE support, mini squast, tandem and single leg balance;    Consulted and Agree with Plan of Care  Patient       Patient will benefit from skilled therapeutic intervention in order to improve the following deficits and impairments:  Abnormal gait, Decreased balance, Decreased strength, Difficulty walking  Visit Diagnosis: Muscle weakness (generalized)  Unsteadiness on feet     Problem List Patient Active Problem List   Diagnosis Date Noted  . Dizziness 08/22/2018  . Gait instability 10/27/2016  . Essential hypertension 07/25/2014  . Atherosclerosis of abdominal aorta ([place] 05/19/2013  . Abnormal EKG 08/09/2012  . Prostate cancer ([place] 08/09/2012   JPhillips GroutPT, DPT, GCS  Stuart Smith 12/22/2018, 9:49 AM  CStanchfieldMAIN [facility]Kaiser [facility] HospitalSERV[facility] [address] [address]Phone: [phone]  Fax:  [phone] Name: Stuart DECMRN: [id number] of Birth: [birth year]

## 2018-12-27 ENCOUNTER — Ambulatory Visit: Payer: Medicare Other

## 2019-01-03 ENCOUNTER — Ambulatory Visit: Payer: Medicare Other

## 2019-01-03 DIAGNOSIS — M6281 Muscle weakness (generalized): Secondary | ICD-10-CM | POA: Diagnosis not present

## 2019-01-03 DIAGNOSIS — R2681 Unsteadiness on feet: Secondary | ICD-10-CM

## 2019-01-03 NOTE — Therapy (Addendum)
[place] MAIN [facility] SERVICES [address] [place], [place], [address] Phone: [phone]   Fax:  [phone]  Physical Therapy Treatment  Patient Details  Name: Stuart Smith MRN: [id number] Date of Birth: [birth year] Referring Provider (PT): Dr. Kary Kos   Encounter Date: 01/03/2019  PT End of Session - 01/03/19 1348    Visit Number  27    Number of Visits  56    Date for PT Re-Evaluation  03/16/19    Authorization Type  Last goals: 12/16/18    PT Start Time  1345    PT Stop Time  1430    PT Time Calculation (min)  45 min    Equipment Utilized During Treatment  Gait belt    Activity Tolerance  Patient tolerated treatment well    Behavior During Therapy  [facility] for tasks assessed/performed       Past Medical History:  Diagnosis Date  . Cataracts, bilateral   . Depression   . Elevated PSA   . H/O seasonal allergies   . History of chickenpox   . Hypertension   . Prostate cancer [facility]) 2013   Surgery   . Torticollis     Past Surgical History:  Procedure Laterality Date  . BACK SURGERY  1995  . GALLBLADDER SURGERY    . PROSTATE SURGERY  2013    There were no vitals filed for this visit.  Subjective Assessment - 01/03/19 1348    Subjective  Pt states that he is doing well today. He denies any pain currently. No LOB or falls. No specific questions or concerns at this time.    Pertinent History  Pt reports that he was washing his head in the sink on 08/19/18 and had a severe bout of dizziness. He reports feeling "totally disoriented." Unclear if he had any vertigo. He states that after he brought his head back up the dizziness resolved. Then the following Sunday he went to stand up during communion at church and got very dizzy. He came to the [facility] ER and was worked up for a CVA. MRI negative for acute changes and carotid ultrasound showed less than 50% stenosis of the internal carotid arteries bilaterally. Pt was kept overnight for  observation and his dizziness persisted into the morning. Pt states that he was seen by physical therapist at the hospital who "threw me around" and it "helped a little bit." Pt was also treated with oral prednisone and meclizine and his symptoms improved. Pt reports that his symptoms are mostly resolved since that time with the exception of some unsteadiness. Pt denies any head trauma or MVA. PMH includes prostate cancer x 6 years for which he has been treated and has resulted in some weakness. Next Friday he will be having botox injections for his chronic torticollis. Prior history of low back surgery. Pt reports that ~1 year ago he had an episode of "haziness" during church, "then everything turned gray" and pt could not stand up. That cleared on it's own after going home and resting. Pt reports some "thickness" feeling on the underside of his feet.     Limitations  Walking    Diagnostic tests  MRI negative for acute changes and carotid ultrasound showed less than 50% stenosis of the internal carotid arteries bilaterally.    Patient Stated Goals  Improve balance    Currently in Pain?  No/denies          TREATMENT   Ther-ex Octane HIIT L10x 30s,  L5x60s for1mnutes with 271mute cool down, unbilled;  Quantam leg press 230# x 15, 245# x 10 with assist from PT to begin movement as well as to educate about how far down to bring weight during controlled lowering; Deep squats x 12 as low as pt is able to go with therapist assisting him down, utilized 2" Airex pad under heels for last few reps to encourage him to go lower and not to be limited by ankle dorsiflexion; Split squats alternating forward LE without UE support as low as possible for patient x 10 bilateral; Sit to stand without UE support from regular height chair with Airex pad under feet x 5 with min/modA+1 assist required today due to fatigue at end of session;   Neuromuscular Re-education Forward tandem gait in // bars x 2  lengths; Forward tandem gait in // bars on 2"x4" x 4 lengths; Side stepping in // bars on 2"x4" x 4 lengths; Standing rapid alternating toe taps to 4" step to work on speed for reaction time x 15 bilateral; 1/2 foam roll (flat side up) tandem balance alternating LE 30s x 2 each; 1/2 foam roll (flat side up) A/P balance alternating LE 30s x 2 each; 1/2 foam roll (flat side up) A/P balance with heel/toe tapping LE 30s x 2 each; Incline balance with eyes closed 30s x 2; Incline balance with eyes closed horizontal and vertical head turns 30s x 2 each;   Pt educated throughout session about proper posture and technique with exercises. Improved exercise technique, movement at target joints, use of target muscles after min to mod verbal, visual, tactile cues.   Pt continues to be able to demonstrate excellent motivation during session. He continues to demonstrate significant improvement in strength with split squats and is able to increase the resistance on leg press. Additional unbilled time provided at end of session on Octane to help build endurance. Will continue to progress HEP as appropriate in future sessions as well as advancing strengthening and balance exercises.Pt will benefit from PT services to address deficits in strength, balance, and mobility in order to return to full function at home.                          PT Short Term Goals - 12/16/18 0809      PT SHORT TERM GOAL #1   Title  Pt will be independent with HEP in order to improve strength and balance in order to decrease fall risk and improve function at home and work.     Time  3    Period  Weeks    Status  Achieved        PT Long Term Goals - 12/16/18 0809      PT LONG TERM GOAL #1   Title  Pt will improve DGI by at least 3 points in order to demonstrate clinically significant improvement in balance and decreased risk for falls.    Baseline  09/01/18: 16/24; 10/05/18: 22/24; 11/04/18: 24/24     Time  6    Period  Weeks    Status  Achieved      PT LONG TERM GOAL #2   Title  Pt will be able to perform sit to stand without UE support in order to improve functional independence with transfers    Baseline  09/01/18: unable to perform; 10/05/18: patient able to perform from standard chair with UE reach; 11/04/18: 5TSTS in 17.1 seconds    Time  6    Period  Weeks    Status  Achieved      PT LONG TERM GOAL #3   Title  Pt will increase 10MWT by at least 0.13 m/s in order to demonstrate clinically significant improvement in community ambulation.    Baseline  09/01/18: self-selected: 14.4s = 0.69 m/s, Fastest: 12.7s = 0.79 m/sec; 10/05/18: self-selected: 12.2s= 0.62ms fastest: 9.2s= 1.03 m/s; 11/04/18: self-selected: 12.2s= 0.868m fastest: 8.1s= 1.23 m/s    Time  6    Period  Weeks    Status  Achieved      PT LONG TERM GOAL #4   Title  Patient will demonstrate improved BLE strength and balance as evidenced by his ability to independently perform 5TSTS without UE support or UE reach from a standard height chair without LOB in order to decrease risk of falls at home and in the community.    Baseline  10/05/2018: UE reach, SBA/CGA for increased repetitions; 11/04/18: 17.1s     Time  4    Period  Weeks    Status  Achieved      PT LONG TERM GOAL #5   Title  Patient will demonstrate step-through gait pattern independently when descending stairs without UE support to improve mobility in the community and optimize independence.    Baseline  10/05/2018: step-to with LUE support; 11/04/18: Able to perform without UE support with reciprocal pattern    Time  4    Period  Weeks    Status  Achieved      Additional Long Term Goals   Additional Long Term Goals  Yes      PT LONG TERM GOAL #6   Title  Pt will decrease 5TSTS by at least 3 seconds in order to demonstrate clinically significant improvement in LE strength     Baseline  09/01/18: unable to perform; 10/05/18: patient able to  perform from standard chair with UE reach; 11/04/18: 17.1 seconds; 12/16/18: 15.5s    Time  8    Period  Weeks    Status  Partially Met    Target Date  12/30/18      PT LONG TERM GOAL #7   Title  Pt will be able to perform single leg balance for greater than 12 seconds on each leg in order to improve balance and decrease his risk for falls    Baseline  11/04/18: 7-9 seconds on both LEs; 12/16/18: LLE: 9.4s average; RLE: 5.2s average    Time  8    Period  Weeks    Status  Partially Met    Target Date  12/30/18      PT LONG TERM GOAL #8   Title  Pt will improve ABC by at least 13% in order to demonstrate clinically significant improvement in balance confidence.     Baseline  12/16/18: 59.25%    Time  12    Period  Weeks    Status  New    Target Date  03/10/19      PT LONG TERM GOAL  #9   TITLE  Pt will increase 6MWT by at least [id number] order to demonstrate clinically significant improvement in cardiopulmonary endurance and community ambulation     Baseline  12/16/18: 1090'    Time  12    Period  Weeks    Status  New    Target Date  03/10/19      PT LONG TERM GOAL  #10   TITLE  Pt  will improve FGA by at least 4 points in order to demonstrate clinically significant improvement in balance and decrease in fall risk    Baseline  12/16/18: 20/28    Time  12    Period  Weeks    Status  New    Target Date  03/10/19          Pt continues to be able to demonstrate excellent motivation during session. He continues to demonstrate significant improvement in strength with split squats and is able to increase the resistance on leg press. Additional unbilled time provided at end of session on Octane to help build endurance. Will continue to progress HEP as appropriate in future sessions as well as advancing strengthening and balance exercises.Pt will benefit from PT services to address deficits in strength, balance, and mobility in order to return to full function at home.   Plan -  01/03/19 1348    Rehab Potential  Good    Clinical Impairments Affecting Rehab Potential  (+) symptoms have resolved    PT Frequency  2x / week    PT Duration  12 weeks    PT Treatment/Interventions  ADLs/Self Care Home Management;Aquatic Therapy;Canalith Repostioning;Cryotherapy;Electrical Stimulation;Iontophoresis 37m/ml Dexamethasone;Moist Heat;Traction;Ultrasound;DME Instruction;Gait training;Stair training;Functional mobility training;Therapeutic activities;Therapeutic exercise;Balance training;Neuromuscular re-education;Cognitive remediation;Patient/family education;Manual techniques;Passive range of motion;Dry needling;Vestibular    PT Next Visit Plan  progress strength and balance, work on reaction time, single leg balance, and endurance    PT Home Exercise Plan  Sit to stand without UE support, mini squast, tandem and single leg balance;    Consulted and Agree with Plan of Care  Patient       Patient will benefit from skilled therapeutic intervention in order to improve the following deficits and impairments:  Abnormal gait, Decreased balance, Decreased strength, Difficulty walking  Visit Diagnosis: Muscle weakness (generalized)  Unsteadiness on feet     Problem List Patient Active Problem List   Diagnosis Date Noted  . Dizziness 08/22/2018  . Gait instability 10/27/2016  . Essential hypertension 07/25/2014  . Atherosclerosis of abdominal aorta ([place] 05/19/2013  . Abnormal EKG 08/09/2012  . Prostate cancer ([place] 08/09/2012     JPhillips GroutPT, DPT, GCS  Yoanna Jurczyk 01/04/2019, 4:29 PM  CLyndonMAIN REastern [facility] HospitalSERVICES [address] [place] [address]Phone: [phone]  Fax:  [phone] Name: Stuart ELLENBURGMRN: [id number] of Birth: 1Jun 18, 1928

## 2019-01-05 ENCOUNTER — Ambulatory Visit: Payer: Medicare Other

## 2019-01-11 ENCOUNTER — Ambulatory Visit: Payer: Medicare Other

## 2019-01-11 DIAGNOSIS — M6281 Muscle weakness (generalized): Secondary | ICD-10-CM | POA: Diagnosis not present

## 2019-01-11 DIAGNOSIS — R2681 Unsteadiness on feet: Secondary | ICD-10-CM

## 2019-01-11 NOTE — Therapy (Signed)
[place] MAIN [facility] SERVICES [address] [place], [place], [address] Phone: [phone]   Fax:  [phone]  Physical Therapy Treatment  Patient Details  Name: Stuart Smith MRN: [id number] Date of Birth: 1927-02-15 Referring Provider (PT): Dr. Kary Kos   Encounter Date: 01/11/2019  PT End of Session - 01/11/19 1307    Visit Number  28    Number of Visits  56    Date for PT Re-Evaluation  03/16/19    Authorization Type  Last goals: 12/16/18    PT Start Time  1301    PT Stop Time  1345    PT Time Calculation (min)  44 min    Equipment Utilized During Treatment  Gait belt    Activity Tolerance  Patient tolerated treatment well    Behavior During Therapy  [facility] for tasks assessed/performed       Past Medical History:  Diagnosis Date  . Cataracts, bilateral   . Depression   . Elevated PSA   . H/O seasonal allergies   . History of chickenpox   . Hypertension   . Prostate cancer [facility]) 2013   Surgery   . Torticollis     Past Surgical History:  Procedure Laterality Date  . BACK SURGERY  1995  . GALLBLADDER SURGERY    . PROSTATE SURGERY  2013    There were no vitals filed for this visit.  Subjective Assessment - 01/11/19 1307    Subjective  Pt states that he is doing well today. He denies any pain currently. No LOB or falls. No specific questions or concerns at this time.    Pertinent History  Pt reports that he was washing his head in the sink on 08/19/18 and had a severe bout of dizziness. He reports feeling "totally disoriented." Unclear if he had any vertigo. He states that after he brought his head back up the dizziness resolved. Then the following Sunday he went to stand up during communion at church and got very dizzy. He came to the [facility] ER and was worked up for a CVA. MRI negative for acute changes and carotid ultrasound showed less than 50% stenosis of the internal carotid arteries bilaterally. Pt was kept overnight for  observation and his dizziness persisted into the morning. Pt states that he was seen by physical therapist at the hospital who "threw me around" and it "helped a little bit." Pt was also treated with oral prednisone and meclizine and his symptoms improved. Pt reports that his symptoms are mostly resolved since that time with the exception of some unsteadiness. Pt denies any head trauma or MVA. PMH includes prostate cancer x 6 years for which he has been treated and has resulted in some weakness. Next Friday he will be having botox injections for his chronic torticollis. Prior history of low back surgery. Pt reports that ~1 year ago he had an episode of "haziness" during church, "then everything turned gray" and pt could not stand up. That cleared on it's own after going home and resting. Pt reports some "thickness" feeling on the underside of his feet.     Limitations  Walking    Diagnostic tests  MRI negative for acute changes and carotid ultrasound showed less than 50% stenosis of the internal carotid arteries bilaterally.    Patient Stated Goals  Improve balance    Currently in Pain?  No/denies          TREATMENT   Ther-ex Octane HIIT L10x 30s,  L5x60s for22mnutes with 23mute cool down, unbilled; Quantam leg press 240# x 14, 255# x 5, 210# x 25 with assist from PT to begin movement as well as to educate about how far down to bring weight during controlled lowering; Seated clams with manual resistance x 15; Seated adductor squeeze with manual resistance x 15; Practiced floor to stand transfers on blue mat table with cues for sequencing with rolling onto stomach, transitioning to quadruped, moving to half kneeling, and finally standing. Pt reports that he gets up and down off the floor every day for his floor exercises;  Manually resisted rolling right and left x 5 in each direction on mat table; Single leg bridges x 10 each side; Supine hip flexion to 90 with alternating leg  extension, coordinated exhale and posteiror pelvic tilt with hip extension x 10 each side; Sidelying hip abduction x 10 bilateral; Sit to stand without UE support from regular height chairwith Airex pad under feet x 5 with minA+1 for last 2 reps;   Pt educated throughout session about proper posture and technique with exercises. Improved exercise technique, movement at target joints, use of target muscles after min to mod verbal, visual, tactile cues.   Pt continues to be able todemonstrate excellent motivation during session. He is able to perform a floor to stand transfer with therapist today. Worked on strengthening on mat table and he is able to perform all exercises as instructed. LE strength is improving. Will work on jumping in future sessions on leg press in [place] continue to work on explosive movements. Will continue to progress HEP as appropriate in future sessions as well as advancing strengthening and balance exercises.Pt will benefit from PT services to address deficits in strength, balance, and mobility in order to return to full function at home.                       PT Short Term Goals - 12/16/18 0809      PT SHORT TERM GOAL #1   Title  Pt will be independent with HEP in order to improve strength and balance in order to decrease fall risk and improve function at home and work.     Time  3    Period  Weeks    Status  Achieved        PT Long Term Goals - 12/16/18 0809      PT LONG TERM GOAL #1   Title  Pt will improve DGI by at least 3 points in order to demonstrate clinically significant improvement in balance and decreased risk for falls.    Baseline  09/01/18: 16/24; 10/05/18: 22/24; 11/04/18: 24/24    Time  6    Period  Weeks    Status  Achieved      PT LONG TERM GOAL #2   Title  Pt will be able to perform sit to stand without UE support in order to improve functional independence with transfers    Baseline  09/01/18: unable  to perform; 10/05/18: patient able to perform from standard chair with UE reach; 11/04/18: 5TSTS in 17.1 seconds    Time  6    Period  Weeks    Status  Achieved      PT LONG TERM GOAL #3   Title  Pt will increase 10MWT by at least 0.13 m/s in order to demonstrate clinically significant improvement in community ambulation.    Baseline  09/01/18: self-selected: 14.4s = 0.69 m/s,  Fastest: 12.7s = 0.79 m/sec; 10/05/18: self-selected: 12.2s= 0.105ms fastest: 9.2s= 1.03 m/s; 11/04/18: self-selected: 12.2s= 0.883m fastest: 8.1s= 1.23 m/s    Time  6    Period  Weeks    Status  Achieved      PT LONG TERM GOAL #4   Title  Patient will demonstrate improved BLE strength and balance as evidenced by his ability to independently perform 5TSTS without UE support or UE reach from a standard height chair without LOB in order to decrease risk of falls at home and in the community.    Baseline  10/05/2018: UE reach, SBA/CGA for increased repetitions; 11/04/18: 17.1s     Time  4    Period  Weeks    Status  Achieved      PT LONG TERM GOAL #5   Title  Patient will demonstrate step-through gait pattern independently when descending stairs without UE support to improve mobility in the community and optimize independence.    Baseline  10/05/2018: step-to with LUE support; 11/04/18: Able to perform without UE support with reciprocal pattern    Time  4    Period  Weeks    Status  Achieved      Additional Long Term Goals   Additional Long Term Goals  Yes      PT LONG TERM GOAL #6   Title  Pt will decrease 5TSTS by at least 3 seconds in order to demonstrate clinically significant improvement in LE strength     Baseline  09/01/18: unable to perform; 10/05/18: patient able to perform from standard chair with UE reach; 11/04/18: 17.1 seconds; 12/16/18: 15.5s    Time  8    Period  Weeks    Status  Partially Met    Target Date  12/30/18      PT LONG TERM GOAL #7   Title  Pt will be able to perform single leg  balance for greater than 12 seconds on each leg in order to improve balance and decrease his risk for falls    Baseline  11/04/18: 7-9 seconds on both LEs; 12/16/18: LLE: 9.4s average; RLE: 5.2s average    Time  8    Period  Weeks    Status  Partially Met    Target Date  12/30/18      PT LONG TERM GOAL #8   Title  Pt will improve ABC by at least 13% in order to demonstrate clinically significant improvement in balance confidence.     Baseline  12/16/18: 59.25%    Time  12    Period  Weeks    Status  New    Target Date  03/10/19      PT LONG TERM GOAL  #9   TITLE  Pt will increase 6MWT by at least [id number] order to demonstrate clinically significant improvement in cardiopulmonary endurance and community ambulation     Baseline  12/16/18: 1090'    Time  12    Period  Weeks    Status  New    Target Date  03/10/19      PT LONG TERM GOAL  #10   TITLE  Pt will improve FGA by at least 4 points in order to demonstrate clinically significant improvement in balance and decrease in fall risk    Baseline  12/16/18: 20/28    Time  12    Period  Weeks    Status  New    Target Date  03/10/19  Plan - 01/11/19 1310    Clinical Impression Statement  Pt continues to be able todemonstrate excellent motivation during session. He is able to perform a floor to stand transfer with therapist today. Worked on strengthening on mat table and he is able to perform all exercises as instructed. LE strength is improving. Will work on jumping in future sessions on leg press in [place] to continue to work on explosive movements. Will continue to progress HEP as appropriate in future sessions as well as advancing strengthening and balance exercises.Pt will benefit from PT services to address deficits in strength, balance, and mobility in order to return to full function at home.    Rehab Potential  Good    Clinical Impairments Affecting Rehab Potential  (+) symptoms have resolved    PT  Frequency  2x / week    PT Duration  12 weeks    PT Treatment/Interventions  ADLs/Self Care Home Management;Aquatic Therapy;Canalith Repostioning;Cryotherapy;Electrical Stimulation;Iontophoresis 56m/ml Dexamethasone;Moist Heat;Traction;Ultrasound;DME Instruction;Gait training;Stair training;Functional mobility training;Therapeutic activities;Therapeutic exercise;Balance training;Neuromuscular re-education;Cognitive remediation;Patient/family education;Manual techniques;Passive range of motion;Dry needling;Vestibular    PT Next Visit Plan  progress strength and balance, work on reaction time, single leg balance, and endurance    PT Home Exercise Plan  Sit to stand without UE support, mini squast, tandem and single leg balance;    Consulted and Agree with Plan of Care  Patient       Patient will benefit from skilled therapeutic intervention in order to improve the following deficits and impairments:  Abnormal gait, Decreased balance, Decreased strength, Difficulty walking  Visit Diagnosis: Muscle weakness (generalized)  Unsteadiness on feet     Problem List Patient Active Problem List   Diagnosis Date Noted  . Dizziness 08/22/2018  . Gait instability 10/27/2016  . Essential hypertension 07/25/2014  . Athe[place] of [place] ([place] 05/19[place]2013  . Abnormal EKG 08/09/2012  . [place] ([place]09[place]   JPhillips GroutPT, DPT, GCS  Payal Stanforth 01/11/2019, 3:59 PM  [place]orth [place]MAIN [facility]SERV[facility] [address] [address]Phone: [phone]  Fax:  [phone] Name: Stuart CALIXTEMRN: [id number] of Birth: [birth year]

## 2019-01-13 ENCOUNTER — Ambulatory Visit: Payer: Medicare Other

## 2019-01-14 ENCOUNTER — Ambulatory Visit: Payer: Medicare Other

## 2019-01-14 DIAGNOSIS — M6281 Muscle weakness (generalized): Secondary | ICD-10-CM | POA: Diagnosis not present

## 2019-01-14 DIAGNOSIS — R2681 Unsteadiness on feet: Secondary | ICD-10-CM

## 2019-01-14 NOTE — Therapy (Signed)
[place] MAIN [facility] SERVICES [address] [place], [place], [address] Phone: [phone]   Fax:  [phone]  Physical Therapy Treatment  Patient Details  Name: Stuart Smith MRN: [id number] Date of Birth: April 29, 1927 Referring Provider (PT): Dr. Kary Kos   Encounter Date: 01/14/2019  PT End of Session - 01/14/19 1128    Visit Number  29    Number of Visits  56    Date for PT Re-Evaluation  03/16/19    Authorization Type  Last goals: 12/16/18    PT Start Time  1040    PT Stop Time  1130    PT Time Calculation (min)  50 min    Equipment Utilized During Treatment  Gait belt    Activity Tolerance  Patient tolerated treatment well    Behavior During Therapy  [facility] for tasks assessed/performed       Past Medical History:  Diagnosis Date  . Cataracts, bilateral   . Depression   . Elevated PSA   . H/O seasonal allergies   . History of chickenpox   . Hypertension   . Prostate cancer [facility]) 2013   Surgery   . Torticollis     Past Surgical History:  Procedure Laterality Date  . BACK SURGERY  1995  . GALLBLADDER SURGERY    . PROSTATE SURGERY  2013    There were no vitals filed for this visit.  Subjective Assessment - 01/14/19 1128    Subjective  Pt states that he is doing well today. He denies any pain currently. No LOB or falls. No specific questions or concerns at this time.    Pertinent History  Pt reports that he was washing his head in the sink on 08/19/18 and had a severe bout of dizziness. He reports feeling "totally disoriented." Unclear if he had any vertigo. He states that after he brought his head back up the dizziness resolved. Then the following Sunday he went to stand up during communion at church and got very dizzy. He came to the [facility] ER and was worked up for a CVA. MRI negative for acute changes and carotid ultrasound showed less than 50% stenosis of the internal carotid arteries bilaterally. Pt was kept overnight for  observation and his dizziness persisted into the morning. Pt states that he was seen by physical therapist at the hospital who "threw me around" and it "helped a little bit." Pt was also treated with oral prednisone and meclizine and his symptoms improved. Pt reports that his symptoms are mostly resolved since that time with the exception of some unsteadiness. Pt denies any head trauma or MVA. PMH includes prostate cancer x 6 years for which he has been treated and has resulted in some weakness. Next Friday he will be having botox injections for his chronic torticollis. Prior history of low back surgery. Pt reports that ~1 year ago he had an episode of "haziness" during church, "then everything turned gray" and pt could not stand up. That cleared on it's own after going home and resting. Pt reports some "thickness" feeling on the underside of his feet.     Limitations  Walking    Diagnostic tests  MRI negative for acute changes and carotid ultrasound showed less than 50% stenosis of the internal carotid arteries bilaterally.    Patient Stated Goals  Improve balance    Currently in Pain?  No/denies         TREATMENT   Ther-ex Octane HIIT L10x 30s, L5x60s  for49mnutes with 235mute cool down, unbilled; Quantam leg press 245# x 11, 255# x 6with assist from PT to begin movementas well as to educate about how far down to bring weight during controlled lowering; [employer] shuttle leg press jumping 33# 2 x 10; Limited range kettlebell deadlifts starting at the top and only lowering to the point where form can be controlled without lumbar flexion 5# x 10, 15# x 15; Sit to stand for speed x 30s, pt can complete 8 repetitions in the course of 30 seconds; Wall pball squat holds 10s hold/10s rest x 3, 15s hold/15s relax x 3; Seated clams with manual resistance x 15; Seated adductor squeeze with manual resistance x 15; Stepping strategy practice in // bars forward, backwards, and laterall x 3 each  direction;   Pt educated throughout session about proper posture and technique with exercises. Improved exercise technique, movement at target joints, use of target muscles after min to mod verbal, visual, tactile cues.   Pt continues to be able todemonstrate excellent motivation during session. He is able to practice jumping on shuttle leg press in [place] and reaction time is impaired. Will continue to work on this in future sessions. Pt will need updated outcome measures and progress note at next session.Pt will benefit from PT services to address deficits in strength, balance, and mobility in order to return to full function at home.                          PT Short Term Goals - 12/16/18 0809      PT SHORT TERM GOAL #1   Title  Pt will be independent with HEP in order to improve strength and balance in order to decrease fall risk and improve function at home and work.     Time  3    Period  Weeks    Status  Achieved        PT Long Term Goals - 12/16/18 0809      PT LONG TERM GOAL #1   Title  Pt will improve DGI by at least 3 points in order to demonstrate clinically significant improvement in balance and decreased risk for falls.    Baseline  09/01/18: 16/24; 10/05/18: 22/24; 11/04/18: 24/24    Time  6    Period  Weeks    Status  Achieved      PT LONG TERM GOAL #2   Title  Pt will be able to perform sit to stand without UE support in order to improve functional independence with transfers    Baseline  09/01/18: unable to perform; 10/05/18: patient able to perform from standard chair with UE reach; 11/04/18: 5TSTS in 17.1 seconds    Time  6    Period  Weeks    Status  Achieved      PT LONG TERM GOAL #3   Title  Pt will increase 10MWT by at least 0.13 m/s in order to demonstrate clinically significant improvement in community ambulation.    Baseline  09/01/18: self-selected: 14.4s = 0.69 m/s, Fastest: 12.7s = 0.79 m/sec;  10/05/18: self-selected: 12.2s= 0.8218mfastest: 9.2s= 1.03 m/s; 11/04/18: self-selected: 12.2s= 0.18m67mastest: 8.1s= 1.23 m/s    Time  6    Period  Weeks    Status  Achieved      PT LONG TERM GOAL #4   Title  Patient will demonstrate improved BLE strength and balance as evidenced by his ability to  independently perform 5TSTS without UE support or UE reach from a standard height chair without LOB in order to decrease risk of falls at home and in the community.    Baseline  10/05/2018: UE reach, SBA/CGA for increased repetitions; 11/04/18: 17.1s     Time  4    Period  Weeks    Status  Achieved      PT LONG TERM GOAL #5   Title  Patient will demonstrate step-through gait pattern independently when descending stairs without UE support to improve mobility in the community and optimize independence.    Baseline  10/05/2018: step-to with LUE support; 11/04/18: Able to perform without UE support with reciprocal pattern    Time  4    Period  Weeks    Status  Achieved      Additional Long Term Goals   Additional Long Term Goals  Yes      PT LONG TERM GOAL #6   Title  Pt will decrease 5TSTS by at least 3 seconds in order to demonstrate clinically significant improvement in LE strength     Baseline  09/01/18: unable to perform; 10/05/18: patient able to perform from standard chair with UE reach; 11/04/18: 17.1 seconds; 12/16/18: 15.5s    Time  8    Period  Weeks    Status  Partially Met    Target Date  12/30/18      PT LONG TERM GOAL #7   Title  Pt will be able to perform single leg balance for greater than 12 seconds on each leg in order to improve balance and decrease his risk for falls    Baseline  11/04/18: 7-9 seconds on both LEs; 12/16/18: LLE: 9.4s average; RLE: 5.2s average    Time  8    Period  Weeks    Status  Partially Met    Target Date  12/30/18      PT LONG TERM GOAL #8   Title  Pt will improve ABC by at least 13% in order to demonstrate clinically significant improvement in  balance confidence.     Baseline  12/16/18: 59.25%    Time  12    Period  Weeks    Status  New    Target Date  03/10/19      PT LONG TERM GOAL  #9   TITLE  Pt will increase 6MWT by at least 91m(1640f in order to demonstrate clinically significant improvement in cardiopulmonary endurance and community ambulation     Baseline  12/16/18: 1090'    Time  12    Period  Weeks    Status  New    Target Date  03/10/19      PT LONG TERM GOAL  #10   TITLE  Pt will improve FGA by at least 4 points in order to demonstrate clinically significant improvement in balance and decrease in fall risk    Baseline  12/16/18: 20/28    Time  12    Period  Weeks    Status  New    Target Date  03/10/19            Plan - 01/14/19 1127    Clinical Impression Statement  Pt continues to be able todemonstrate excellent motivation during session. He is able to practice jumping on shuttle leg press in [place] and reaction time is impaired. Will continue to work on this in future sessions. Pt will need updated outcome measures and progress note at  next session.Pt will benefit from PT services to address deficits in strength, balance, and mobility in order to return to full function at home.    Rehab Potential  Good    Clinical Impairments Affecting Rehab Potential  (+) symptoms have resolved    PT Frequency  2x / week    PT Duration  12 weeks    PT Treatment/Interventions  ADLs/Self Care Home Management;Aquatic Therapy;Canalith Repostioning;Cryotherapy;Electrical Stimulation;Iontophoresis 86m/ml Dexamethasone;Moist Heat;Traction;Ultrasound;DME Instruction;Gait training;Stair training;Functional mobility training;Therapeutic activities;Therapeutic exercise;Balance training;Neuromuscular re-education;Cognitive remediation;Patient/family education;Manual techniques;Passive range of motion;Dry needling;Vestibular    PT Next Visit Plan  Update goals, progress note, progress strength and balance,  work on reaction time, single leg balance, and endurance    PT Home Exercise Plan  Sit to stand without UE support, mini squast, tandem and single leg balance;    Consulted and Agree with Plan of Care  Patient       Patient will benefit from skilled therapeutic intervention in order to improve the following deficits and impairments:  Abnormal gait, Decreased balance, Decreased strength, Difficulty walking  Visit Diagnosis: Muscle weakness (generalized)  Unsteadiness on feet     Problem List Patient Active Problem List   Diagnosis Date Noted  . Dizziness 08/22/2018  . Gait instability 10/27/2016  . Essential hypertension 07/25/2014  . Atherosclerosis of abdominal aorta ([place] 05/[place]/2013  . Abnormal EKG 08/09/2012  . [place]state cancer ([place] 08/09/2012   JPhillips GroutPT, DPT, GCS  Huprich,Jason 01/14/2019, 5:28 PM  CCoulee DamMAIN RPalms Behavioral HealthSERVICES [address] [address]Phone: [phone]  Fax:  [phone] Name: Stuart BARTNIKMRN: [id number] of Birth: 1Dec 15, 1928

## 2019-01-17 ENCOUNTER — Ambulatory Visit: Payer: Medicare Other | Attending: Family Medicine

## 2019-01-17 VITALS — BP 132/63 | HR 80

## 2019-01-17 DIAGNOSIS — R2681 Unsteadiness on feet: Secondary | ICD-10-CM | POA: Insufficient documentation

## 2019-01-17 DIAGNOSIS — M6281 Muscle weakness (generalized): Secondary | ICD-10-CM | POA: Insufficient documentation

## 2019-01-17 NOTE — Therapy (Signed)
[place] MAIN [facility] SERVICES [address] [place], [place], [address] Phone: [phone]   Fax:  [phone]  Physical Therapy Progress Note   Dates of reporting period  12/02/18   to   01/17/19  Patient Details  Name: Stuart Smith MRN: [id number] Date of Birth: [birth year] Referring Provider (PT): Dr. Kary Kos   Encounter Date: 01/17/2019  PT End of Session - 01/17/19 0845    Visit Number  30    Number of Visits  56    Date for PT Re-Evaluation  03/16/19    Authorization Type  Last goals: 12/16/18, updated 01/17/19    PT Start Time  0842    PT Stop Time  0930    PT Time Calculation (min)  48 min    Equipment Utilized During Treatment  Gait belt    Activity Tolerance  Patient tolerated treatment well    Behavior During Therapy  [facility] for tasks assessed/performed       Past Medical History:  Diagnosis Date  . Cataracts, bilateral   . Depression   . Elevated PSA   . H/O seasonal allergies   . History of chickenpox   . Hypertension   . Prostate cancer [facility]) 2013   Surgery   . Torticollis     Past Surgical History:  Procedure Laterality Date  . BACK SURGERY  1995  . GALLBLADDER SURGERY    . PROSTATE SURGERY  2013    Vitals:   01/17/19 0904  BP: 132/63  Pulse: 80  SpO2: 97%    Subjective Assessment - 01/17/19 0844    Subjective  Pt states that he is doing well today. He denies any pain currently. No LOB or falls. No specific questions or concerns at this time.    Pertinent History  Pt reports that he was washing his head in the sink on 08/19/18 and had a severe bout of dizziness. He reports feeling "totally disoriented." Unclear if he had any vertigo. He states that after he brought his head back up the dizziness resolved. Then the following Sunday he went to stand up during communion at church and got very dizzy. He came to the [facility] ER and was worked up for a CVA. MRI negative for acute changes and carotid ultrasound showed less  than 50% stenosis of the internal carotid arteries bilaterally. Pt was kept overnight for observation and his dizziness persisted into the morning. Pt states that he was seen by physical therapist at the hospital who "threw me around" and it "helped a little bit." Pt was also treated with oral prednisone and meclizine and his symptoms improved. Pt reports that his symptoms are mostly resolved since that time with the exception of some unsteadiness. Pt denies any head trauma or MVA. PMH includes prostate cancer x 6 years for which he has been treated and has resulted in some weakness. Next Friday he will be having botox injections for his chronic torticollis. Prior history of low back surgery. Pt reports that ~1 year ago he had an episode of "haziness" during church, "then everything turned gray" and pt could not stand up. That cleared on it's own after going home and resting. Pt reports some "thickness" feeling on the underside of his feet.     Limitations  Walking    Diagnostic tests  MRI negative for acute changes and carotid ultrasound showed less than 50% stenosis of the internal carotid arteries bilaterally.    Patient Stated Goals  Improve balance  Currently in Pain?  No/denies         [facility] PT Assessment - 01/17/19 0931      6 Minute walk- Post Test   6 Minute Walk Post Test  yes    BP (mmHg)  175/65    HR (bpm)  105    02 Sat (%RA)  99 %    Modified Borg Scale for Dyspnea  5- Strong or hard breathing    Perceived Rate of Exertion (Borg)  13- Somewhat hard      6 minute walk test results    Aerobic Endurance Distance Walked  1165          TREATMENT   Ther-ex Octane HIIT L3 x 5 minutes for gentle warm-up prior to outcome measures. History obtained (3 minutes unbilled); Updated outcome measures with patient including 5TSTS 17.2s, 30s sit to stand test 7.0, miniBEST 24/28, 6MWT 1165'; Pt completed ABC: 68.125% (unbilled)    Pt educated throughout session about proper  posture and technique with outcome measures as needed exercises. Improved technique after min to mod verbal, visual, tactile cues.   Patient demonstrates improvements in some of his outcome measures since starting therapy. He appears slightly tired today and he confirms that he is not feeling his best. His 5TSTS went up slightly from 15.5s on 12/16/18 to 17.2s today. He is only able to perform 7 sit to stands compared to 8.5 before. However his balance and endurance tests improved. His minBEST improved from 20/28 to 24/28 and his 6MWT increased from 1090' to 1165' today. His balance confidence has also improved and increased from 59.25% on 12/16/18 to 68.125% today. Patient's condition has the potential to improve in response to therapy. Maximum improvement is yet to be obtained. The anticipated improvement is attainable and reasonable in a generally predictable time. Patient will continue to benefit from skilled therapeutic intervention to optimize independence and improve function.                         PT Short Term Goals - 01/17/19 0904      PT SHORT TERM GOAL #1   Title  Pt will be independent with HEP in order to improve strength and balance in order to decrease fall risk and improve function at home and work.     Time  3    Period  Weeks    Status  Achieved        PT Long Term Goals - 01/17/19 6213      PT LONG TERM GOAL #1   Title  Pt will improve DGI by at least 3 points in order to demonstrate clinically significant improvement in balance and decreased risk for falls.    Baseline  09/01/18: 16/24; 10/05/18: 22/24; 11/04/18: 24/24    Time  6    Period  Weeks    Status  Achieved      PT LONG TERM GOAL #2   Title  Pt will be able to perform sit to stand without UE support in order to improve functional independence with transfers    Baseline  09/01/18: unable to perform; 10/05/18: patient able to perform from standard chair with UE reach; 11/04/18: 5TSTS in  17.1 seconds    Time  6    Period  Weeks    Status  Achieved      PT LONG TERM GOAL #3   Title  Pt will increase 10MWT by at least 0.13 m/s in order  to demonstrate clinically significant improvement in community ambulation.    Baseline  09/01/18: self-selected: 14.4s = 0.69 m/s, Fastest: 12.7s = 0.79 m/sec; 10/05/18: self-selected: 12.2s= 0.32ms fastest: 9.2s= 1.03 m/s; 11/04/18: self-selected: 12.2s= 0.833m fastest: 8.1s= 1.23 m/s    Time  6    Period  Weeks    Status  Achieved      PT LONG TERM GOAL #4   Title  Patient will demonstrate improved BLE strength and balance as evidenced by his ability to independently perform 5TSTS without UE support or UE reach from a standard height chair without LOB in order to decrease risk of falls at home and in the community.    Baseline  10/05/2018: UE reach, SBA/CGA for increased repetitions; 11/04/18: 17.1s     Time  4    Period  Weeks    Status  Achieved      PT LONG TERM GOAL #5   Title  Patient will demonstrate step-through gait pattern independently when descending stairs without UE support to improve mobility in the community and optimize independence.    Baseline  10/05/2018: step-to with LUE support; 11/04/18: Able to perform without UE support with reciprocal pattern    Time  4    Period  Weeks    Status  Achieved      PT LONG TERM GOAL #6   Title  Pt will decrease 5TSTS by at least 3 seconds in order to demonstrate clinically significant improvement in LE strength     Baseline  09/01/18: unable to perform; 10/05/18: patient able to perform from standard chair with UE reach; 11/04/18: 17.1 seconds; 12/16/18: 15.5s; 01/17/19: 17.2s    Time  8    Period  Weeks    Status  Partially Met    Target Date  03/16/19      PT LONG TERM GOAL #7   Title  Pt will be able to perform single leg balance for greater than 12 seconds on each leg in order to improve balance and decrease his risk for falls    Baseline  11/04/18: 7-9 seconds on both LEs;  12/16/18: LLE: 9.4s average; RLE: 5.2s average; 01/17/19: >20s bilaterally    Time  8    Period  Weeks    Status  Achieved      PT LONG TERM GOAL #8   Title  Pt will improve ABC by at least 13% in order to demonstrate clinically significant improvement in balance confidence.     Baseline  12/16/18: 59.25%; 01/17/19: 68.125%    Time  12    Period  Weeks    Status  Partially Met    Target Date  03/10/19      PT LONG TERM GOAL  #9   TITLE  Pt will increase 6MWT by at least [id number] order to demonstrate clinically significant improvement in cardiopulmonary endurance and community ambulation     Baseline  12/16/18: 1090'; 01/17/19: 1165'    Time  12    Period  Weeks    Status  Partially Met    Target Date  03/10/19      PT LONG TERM GOAL  #10   TITLE  Pt will improve minBEST by at least 4 points in order to demonstrate clinically significant improvement in balance and decrease in fall risk    Baseline  12/16/18: 20/28; 01/17/19: 24/28    Time  12    Period  Weeks    Status  Achieved  Plan - 01/17/19 0845    Clinical Impression Statement  Patient demonstrates improvements in some of his outcome measures since starting therapy. He appears slightly tired today and he confirms that he is not feeling his best. His 5TSTS went up slightly from 15.5s on 12/16/18 to 17.2s today. He is only able to perform 7 sit to stands compared to 8.5 before. However his balance and endurance tests improved. His minBEST improved from 20/28 to 24/28 and his 6MWT increased from 1090' to 1165' today. His balance confidence has also improved and increased from 59.25% on 12/16/18 to 68.125% today. Patient's condition has the potential to improve in response to therapy. Maximum improvement is yet to be obtained. The anticipated improvement is attainable and reasonable in a generally predictable time. Patient will continue to benefit from skilled therapeutic intervention to optimize independence and improve  function.    Rehab Potential  Good    Clinical Impairments Affecting Rehab Potential  (+) symptoms have resolved    PT Frequency  2x / week    PT Duration  12 weeks    PT Treatment/Interventions  ADLs/Self Care Home Management;Aquatic Therapy;Canalith Repostioning;Cryotherapy;Electrical Stimulation;Iontophoresis 21m/ml Dexamethasone;Moist Heat;Traction;Ultrasound;DME Instruction;Gait training;Stair training;Functional mobility training;Therapeutic activities;Therapeutic exercise;Balance training;Neuromuscular re-education;Cognitive remediation;Patient/family education;Manual techniques;Passive range of motion;Dry needling;Vestibular    PT Next Visit Plan  Progress strength and balance, work on reaction time, single leg balance, and endurance    PT Home Exercise Plan  Sit to stand without UE support, mini squast, tandem and single leg balance;    Consulted and Agree with Plan of Care  Patient       Patient will benefit from skilled therapeutic intervention in order to improve the following deficits and impairments:  Abnormal gait, Decreased balance, Decreased strength, Difficulty walking  Visit Diagnosis: Muscle weakness (generalized)  Unsteadiness on feet     Problem List Patient Active Problem List   Diagnosis Date Noted  . Dizziness 08/22/2018  . Gait instability 10/27/2016  . Essential hypertension 07/25/2014  . Atherosclerosis of abdominal aorta ([place] 05/19/2013  . Abnormal EKG 08/09/2012  . Prostate cancer ([place] 08/09/2012   Stuart GroutPT, DPT, GCS  Stuart Smith 01/17/2019, 11:16 AM  [place] [facility] [address] [address]Phone: [phone]  Fax:  [phone] Name: Stuart DELBUONOMRN: [id number] of Birth: 1August 06, 1928

## 2019-01-18 ENCOUNTER — Ambulatory Visit: Payer: Medicare Other

## 2019-01-18 DIAGNOSIS — R2681 Unsteadiness on feet: Secondary | ICD-10-CM

## 2019-01-18 DIAGNOSIS — M6281 Muscle weakness (generalized): Secondary | ICD-10-CM | POA: Diagnosis not present

## 2019-01-18 NOTE — Therapy (Signed)
[place] MAIN [facility] SERVICES [address] [place], [place], [address] Phone: [phone]   Fax:  [phone]  Physical Therapy Treatment  Patient Details  Name: Stuart Smith MRN: [id number] Date of Birth: 1927-04-28 Referring Provider (PT): Dr. Kary Kos   Encounter Date: 01/18/2019  PT End of Session - 01/18/19 0939    Visit Number  31    Number of Visits  78    Date for PT Re-Evaluation  03/16/19    Authorization Type  Last goals: 12/16/18, updated 01/17/19    PT Start Time  0930    PT Stop Time  1014    PT Time Calculation (min)  44 min    Equipment Utilized During Treatment  Gait belt    Activity Tolerance  Patient tolerated treatment well    Behavior During Therapy  [facility] for tasks assessed/performed       Past Medical History:  Diagnosis Date  . Cataracts, bilateral   . Depression   . Elevated PSA   . H/O seasonal allergies   . History of chickenpox   . Hypertension   . Prostate cancer [facility]) 2013   Surgery   . Torticollis     Past Surgical History:  Procedure Laterality Date  . BACK SURGERY  1995  . GALLBLADDER SURGERY    . PROSTATE SURGERY  2013    There were no vitals filed for this visit.  Subjective Assessment - 01/18/19 0938    Subjective  Patient reports he has been doing well, is going to play golf tomorrow. No falls or LOB. No pain.     Pertinent History  Pt reports that he was washing his head in the sink on 08/19/18 and had a severe bout of dizziness. He reports feeling "totally disoriented." Unclear if he had any vertigo. He states that after he brought his head back up the dizziness resolved. Then the following Sunday he went to stand up during communion at church and got very dizzy. He came to the [facility] ER and was worked up for a CVA. MRI negative for acute changes and carotid ultrasound showed less than 50% stenosis of the internal carotid arteries bilaterally. Pt was kept overnight for observation and his  dizziness persisted into the morning. Pt states that he was seen by physical therapist at the hospital who "threw me around" and it "helped a little bit." Pt was also treated with oral prednisone and meclizine and his symptoms improved. Pt reports that his symptoms are mostly resolved since that time with the exception of some unsteadiness. Pt denies any head trauma or MVA. PMH includes prostate cancer x 6 years for which he has been treated and has resulted in some weakness. Next Friday he will be having botox injections for his chronic torticollis. Prior history of low back surgery. Pt reports that ~1 year ago he had an episode of "haziness" during church, "then everything turned gray" and pt could not stand up. That cleared on it's own after going home and resting. Pt reports some "thickness" feeling on the underside of his feet.     Limitations  Walking    Diagnostic tests  MRI negative for acute changes and carotid ultrasound showed less than 50% stenosis of the internal carotid arteries bilaterally.    Patient Stated Goals  Improve balance    Currently in Pain?  No/denies          TREATMENT   Ther-ex Octane HIIT L10x 4 minutes Quantam leg  press 230# x 15, #90 single LE 9x each LE PT to begin movement as well as to educate about how far down to bring weight during controlled lowering; Deep squats x 6 as low as pt is able to go with therapist assisting him down, utilized 2" Airex pad under feet, reaching down to pick up golf ball from within // bars.  Bosu ball spit squats, 10x each LE forward.  Bosu ball lateral squat 10x each LE forward.  Sit to stand without UE support from regular height chairwith Airex pad under feet x 5 with min/modA+1 assist required today due to fatigue at end of session;   Neuromuscular Re-education; CGA required for all standing interventions with occasional posterior LOB due to fatigue of ankle reactions.  Airex pad: balloons taps inside and outside BOS   1/2 foam roll(flat side up)tandem balance alternating LE 30s x 2 each; 1/2 foam roll(flat side up)A/P balance alternating LE 30s x 2 each; 1/2 foam roll(flat side up)A/P balance with heel/toe tapping LE 30s x 2 each;     Pt educated throughout session about proper posture and technique with exercises. Improved exercise technique, movement at target joints, use of target muscles after min to mod verbal, visual, tactile cues.             PT Education - 01/18/19 [phone]    Education Details  balance strategies, exercise technique     Person(s) Educated  Patient    Methods  Explanation;Demonstration;Tactile cues;Verbal cues    Comprehension  Verbalized understanding;Returned demonstration;Verbal cues required;Tactile cues required       PT Short Term Goals - 01/17/19 0904      PT SHORT TERM GOAL #1   Title  Pt will be independent with HEP in order to improve strength and balance in order to decrease fall risk and improve function at home and work.     Time  3    Period  Weeks    Status  Achieved        PT Long Term Goals - 01/17/19 2706      PT LONG TERM GOAL #1   Title  Pt will improve DGI by at least 3 points in order to demonstrate clinically significant improvement in balance and decreased risk for falls.    Baseline  09/01/18: 16/24; 10/05/18: 22/24; 11/04/18: 24/24    Time  6    Period  Weeks    Status  Achieved      PT LONG TERM GOAL #2   Title  Pt will be able to perform sit to stand without UE support in order to improve functional independence with transfers    Baseline  09/01/18: unable to perform; 10/05/18: patient able to perform from standard chair with UE reach; 11/04/18: 5TSTS in 17.1 seconds    Time  6    Period  Weeks    Status  Achieved      PT LONG TERM GOAL #3   Title  Pt will increase 10MWT by at least 0.13 m/s in order to demonstrate clinically significant improvement in community ambulation.    Baseline  09/01/18: self-selected:  14.4s = 0.69 m/s, Fastest: 12.7s = 0.79 m/sec; 10/05/18: self-selected: 12.2s= 0.57ms fastest: 9.2s= 1.03 m/s; 11/04/18: self-selected: 12.2s= 0.877m fastest: 8.1s= 1.23 m/s    Time  6    Period  Weeks    Status  Achieved      PT LONG TERM GOAL #4   Title  Patient will demonstrate improved BLE  strength and balance as evidenced by his ability to independently perform 5TSTS without UE support or UE reach from a standard height chair without LOB in order to decrease risk of falls at home and in the community.    Baseline  10/05/2018: UE reach, SBA/CGA for increased repetitions; 11/04/18: 17.1s     Time  4    Period  Weeks    Status  Achieved      PT LONG TERM GOAL #5   Title  Patient will demonstrate step-through gait pattern independently when descending stairs without UE support to improve mobility in the community and optimize independence.    Baseline  10/05/2018: step-to with LUE support; 11/04/18: Able to perform without UE support with reciprocal pattern    Time  4    Period  Weeks    Status  Achieved      PT LONG TERM GOAL #6   Title  Pt will decrease 5TSTS by at least 3 seconds in order to demonstrate clinically significant improvement in LE strength     Baseline  09/01/18: unable to perform; 10/05/18: patient able to perform from standard chair with UE reach; 11/04/18: 17.1 seconds; 12/16/18: 15.5s; 01/17/19: 17.2s    Time  8    Period  Weeks    Status  Partially Met    Target Date  03/16/19      PT LONG TERM GOAL #7   Title  Pt will be able to perform single leg balance for greater than 12 seconds on each leg in order to improve balance and decrease his risk for falls    Baseline  11/04/18: 7-9 seconds on both LEs; 12/16/18: LLE: 9.4s average; RLE: 5.2s average; 01/17/19: >20s bilaterally    Time  8    Period  Weeks    Status  Achieved      PT LONG TERM GOAL #8   Title  Pt will improve ABC by at least 13% in order to demonstrate clinically significant improvement in balance  confidence.     Baseline  12/16/18: 59.25%; 01/17/19: 68.125%    Time  12    Period  Weeks    Status  Partially Met    Target Date  03/10/19      PT LONG TERM GOAL  #9   TITLE  Pt will increase 6MWT by at least 58m(1625f in order to demonstrate clinically significant improvement in cardiopulmonary endurance and community ambulation     Baseline  12/16/18: 1090'; 01/17/19: 1165'    Time  12    Period  Weeks    Status  Partially Met    Target Date  03/10/19      PT LONG TERM GOAL  #10   TITLE  Pt will improve minBEST by at least 4 points in order to demonstrate clinically significant improvement in balance and decrease in fall risk    Baseline  12/16/18: 20/28; 01/17/19: 24/28    Time  12    Period  Weeks    Status  Achieved            Plan - 01/18/19 1005    Clinical Impression Statement  Patient presents with good motivation throughout session. Is challenged with deep squats, having to choose between quality of movement or velocity of movement, losing balance as he increases speed. Patient's strength and stability in general are improving with increased duration of interventions performed prior to seated rest break. Pt will benefit from PT services to address deficits in  strength, balance, and mobility in order to return to full function at home.    Rehab Potential  Good    Clinical Impairments Affecting Rehab Potential  (+) symptoms have resolved    PT Frequency  2x / week    PT Duration  12 weeks    PT Treatment/Interventions  ADLs/Self Care Home Management;Aquatic Therapy;Canalith Repostioning;Cryotherapy;Electrical Stimulation;Iontophoresis 82m/ml Dexamethasone;Moist Heat;Traction;Ultrasound;DME Instruction;Gait training;Stair training;Functional mobility training;Therapeutic activities;Therapeutic exercise;Balance training;Neuromuscular re-education;Cognitive remediation;Patient/family education;Manual techniques;Passive range of motion;Dry needling;Vestibular    PT Next Visit  Plan  Progress strength and balance, work on reaction time, single leg balance, and endurance    PT Home Exercise Plan  Sit to stand without UE support, mini squast, tandem and single leg balance;    Consulted and Agree with Plan of Care  Patient       Patient will benefit from skilled therapeutic intervention in order to improve the following deficits and impairments:  Abnormal gait, Decreased balance, Decreased strength, Difficulty walking  Visit Diagnosis: Muscle weakness (generalized)  Unsteadiness on feet     Problem List Patient Active Problem List   Diagnosis Date Noted  . Dizziness 08/22/2018  . Gait instability 10/27/2016  . Essential hypertension 07/25/2014  . Atherosclerosis of abdominal aorta ([place] 05/19/2013  . Abnormal EKG 08/09/2012  . Prostate cancer ([place] 08/09/2012    MJanna Arch3/01/2019, 10:22 AM  [place] [facility] HospitalSERVICES [address] [place] [address]Phone: [phone]  Fax:  [phone] Name: ELOMAN LOGANMRN: [id number] of Birth: [birth year]

## 2019-01-19 ENCOUNTER — Ambulatory Visit: Payer: Medicare Other

## 2019-01-24 ENCOUNTER — Ambulatory Visit: Payer: Medicare Other

## 2019-01-24 DIAGNOSIS — R2681 Unsteadiness on feet: Secondary | ICD-10-CM

## 2019-01-24 DIAGNOSIS — M6281 Muscle weakness (generalized): Secondary | ICD-10-CM

## 2019-01-24 NOTE — Therapy (Signed)
[place] MAIN [facility] SERVICES [address] [place], [place], [address] Phone: [phone]   Fax:  [phone]  Physical Therapy Treatment   Patient Details  Name: Stuart Smith MRN: [id number] Date of Birth: [birth year] Referring Provider (PT): Dr. Kary Kos   Encounter Date: 01/24/2019  PT End of Session - 01/24/19 0839    Visit Number  32    Number of Visits  51    Date for PT Re-Evaluation  03/16/19    Authorization Type  Last goals: 01/17/19    PT Start Time  0840    PT Stop Time  0930    PT Time Calculation (min)  50 min    Equipment Utilized During Treatment  Gait belt    Activity Tolerance  Patient tolerated treatment well    Behavior During Therapy  [facility] for tasks assessed/performed       Past Medical History:  Diagnosis Date  . Cataracts, bilateral   . Depression   . Elevated PSA   . H/O seasonal allergies   . History of chickenpox   . Hypertension   . Prostate cancer [facility]) 2013   Surgery   . Torticollis     Past Surgical History:  Procedure Laterality Date  . BACK SURGERY  1995  . GALLBLADDER SURGERY    . PROSTATE SURGERY  2013    There were no vitals filed for this visit.  Subjective Assessment - 01/24/19 0839    Subjective  Patient reports he has been doing well. No falls or LOB. No pain. HEP is going well.     Pertinent History  Pt reports that he was washing his head in the sink on 08/19/18 and had a severe bout of dizziness. He reports feeling "totally disoriented." Unclear if he had any vertigo. He states that after he brought his head back up the dizziness resolved. Then the following Sunday he went to stand up during communion at church and got very dizzy. He came to the [facility] ER and was worked up for a CVA. MRI negative for acute changes and carotid ultrasound showed less than 50% stenosis of the internal carotid arteries bilaterally. Pt was kept overnight for observation and his dizziness persisted into the  morning. Pt states that he was seen by physical therapist at the hospital who "threw me around" and it "helped a little bit." Pt was also treated with oral prednisone and meclizine and his symptoms improved. Pt reports that his symptoms are mostly resolved since that time with the exception of some unsteadiness. Pt denies any head trauma or MVA. PMH includes prostate cancer x 6 years for which he has been treated and has resulted in some weakness. Next Friday he will be having botox injections for his chronic torticollis. Prior history of low back surgery. Pt reports that ~1 year ago he had an episode of "haziness" during church, "then everything turned gray" and pt could not stand up. That cleared on it's own after going home and resting. Pt reports some "thickness" feeling on the underside of his feet.     Limitations  Walking    Diagnostic tests  MRI negative for acute changes and carotid ultrasound showed less than 50% stenosis of the internal carotid arteries bilaterally.    Patient Stated Goals  Improve balance    Currently in Pain?  No/denies           TREATMENT   Ther-ex Octane HIIT L4 x 60s, L3 x 30s for  5 minutes during history (3 minutes unbilled); Split squats alternating forward LE x 10 each; Sit to stand from regular height chair without UE support with emphasis on speed x 10, x 7; Attempted single leg deadlift un weighted but pt struggles with form; Deadlift with 8# dumbbell x 10 with extensive cues for proper hip hinge and to decrease lumbar flexion; Single leg deadlift (golfer's pick-up) unweighted with single UE support, significant cues for proper technique; Quantam leg press240# x 10, 195# x 10 with full lowering during second set, assist to start first set and cues about how far down to bring weight during controlled lowering of both sets; Quantum heel raises 120# x 20;   Neuromuscular Re-education;  Airex deadlift with 8# dumbbell x 10 with extensive cues for  proper hip hinge and to decrease lumbar flexion; Airex cone taps alternating LE x 10 each; Airex cone taps in 1 and 2 cone sequencing alternating LE x multiple bouts each;   Pt educated throughout session about proper posture and technique with exercises. Improved exercise technique, movement at target joints, use of target muscles after min to mod verbal, visual, tactile cues.   Pt continues to be able todemonstrate excellent motivation during session.He demonstrates improved strength and speed with sit to stand from chair in parallel bars. He is able to perform single leg deadlifts but requires extensive practice and cues for proper form.Pt will benefit from PT services to address deficits in strength, balance, and mobility in order to return to full function at home.                     PT Short Term Goals - 01/17/19 0904      PT SHORT TERM GOAL #1   Title  Pt will be independent with HEP in order to improve strength and balance in order to decrease fall risk and improve function at home and work.     Time  3    Period  Weeks    Status  Achieved        PT Long Term Goals - 01/17/19 1610      PT LONG TERM GOAL #1   Title  Pt will improve DGI by at least 3 points in order to demonstrate clinically significant improvement in balance and decreased risk for falls.    Baseline  09/01/18: 16/24; 10/05/18: 22/24; 11/04/18: 24/24    Time  6    Period  Weeks    Status  Achieved      PT LONG TERM GOAL #2   Title  Pt will be able to perform sit to stand without UE support in order to improve functional independence with transfers    Baseline  09/01/18: unable to perform; 10/05/18: patient able to perform from standard chair with UE reach; 11/04/18: 5TSTS in 17.1 seconds    Time  6    Period  Weeks    Status  Achieved      PT LONG TERM GOAL #3   Title  Pt will increase 10MWT by at least 0.13 m/s in order to demonstrate clinically significant improvement in  community ambulation.    Baseline  09/01/18: self-selected: 14.4s = 0.69 m/s, Fastest: 12.7s = 0.79 m/sec; 10/05/18: self-selected: 12.2s= 0.61ms fastest: 9.2s= 1.03 m/s; 11/04/18: self-selected: 12.2s= 0.867m fastest: 8.1s= 1.23 m/s    Time  6    Period  Weeks    Status  Achieved      PT LONG TERM GOAL #4  Title  Patient will demonstrate improved BLE strength and balance as evidenced by his ability to independently perform 5TSTS without UE support or UE reach from a standard height chair without LOB in order to decrease risk of falls at home and in the community.    Baseline  10/05/2018: UE reach, SBA/CGA for increased repetitions; 11/04/18: 17.1s     Time  4    Period  Weeks    Status  Achieved      PT LONG TERM GOAL #5   Title  Patient will demonstrate step-through gait pattern independently when descending stairs without UE support to improve mobility in the community and optimize independence.    Baseline  10/05/2018: step-to with LUE support; 11/04/18: Able to perform without UE support with reciprocal pattern    Time  4    Period  Weeks    Status  Achieved      PT LONG TERM GOAL #6   Title  Pt will decrease 5TSTS by at least 3 seconds in order to demonstrate clinically significant improvement in LE strength     Baseline  09/01/18: unable to perform; 10/05/18: patient able to perform from standard chair with UE reach; 11/04/18: 17.1 seconds; 12/16/18: 15.5s; 01/17/19: 17.2s    Time  8    Period  Weeks    Status  Partially Met    Target Date  03/16/19      PT LONG TERM GOAL #7   Title  Pt will be able to perform single leg balance for greater than 12 seconds on each leg in order to improve balance and decrease his risk for falls    Baseline  11/04/18: 7-9 seconds on both LEs; 12/16/18: LLE: 9.4s average; RLE: 5.2s average; 01/17/19: >20s bilaterally    Time  8    Period  Weeks    Status  Achieved      PT LONG TERM GOAL #8   Title  Pt will improve ABC by at least 13% in order to  demonstrate clinically significant improvement in balance confidence.     Baseline  12/16/18: 59.25%; 01/17/19: 68.125%    Time  12    Period  Weeks    Status  Partially Met    Target Date  03/10/19      PT LONG TERM GOAL  #9   TITLE  Pt will increase 6MWT by at least 57m(169f in order to demonstrate clinically significant improvement in cardiopulmonary endurance and community ambulation     Baseline  12/16/18: 1090'; 01/17/19: 1165'    Time  12    Period  Weeks    Status  Partially Met    Target Date  03/10/19      PT LONG TERM GOAL  #10   TITLE  Pt will improve minBEST by at least 4 points in order to demonstrate clinically significant improvement in balance and decrease in fall risk    Baseline  12/16/18: 20/28; 01/17/19: 24/28    Time  12    Period  Weeks    Status  Achieved            Plan - 01/24/19 0840    Clinical Impression Statement  Pt continues to be able todemonstrate excellent motivation during session.He demonstrates improved strength and speed with sit to stand from chair in parallel bars. He is able to perform single leg deadlifts but requires extensive practice and cues for proper form.Pt will benefit from PT services to address deficits in strength,  balance, and mobility in order to return to full function at home.    Rehab Potential  Good    Clinical Impairments Affecting Rehab Potential  (+) symptoms have resolved    PT Frequency  2x / week    PT Duration  12 weeks    PT Treatment/Interventions  ADLs/Self Care Home Management;Aquatic Therapy;Canalith Repostioning;Cryotherapy;Electrical Stimulation;Iontophoresis 64m/ml Dexamethasone;Moist Heat;Traction;Ultrasound;DME Instruction;Gait training;Stair training;Functional mobility training;Therapeutic activities;Therapeutic exercise;Balance training;Neuromuscular re-education;Cognitive remediation;Patient/family education;Manual techniques;Passive range of motion;Dry needling;Vestibular    PT Next Visit Plan   Progress strength and balance, work on reaction time, single leg balance, and endurance    PT Home Exercise Plan  Sit to stand without UE support, mini squast, tandem and single leg balance;    Consulted and Agree with Plan of Care  Patient       Patient will benefit from skilled therapeutic intervention in order to improve the following deficits and impairments:  Abnormal gait, Decreased balance, Decreased strength, Difficulty walking  Visit Diagnosis: Muscle weakness (generalized)  Unsteadiness on feet     Problem List Patient Active Problem List   Diagnosis Date Noted  . Dizziness 08/22/2018  . Gait instability 10/27/2016  . Essential hypertension 07/25/2014  . Atherosclerosis of abdominal aorta ([place] 05/19/2013  . Abnormal EKG 08/09/2012  . Prostate cancer ([place] 08/09/2012   JPhillips GroutPT, DPT, GCS  Shashwat Cleary 01/24/2019, 1:00 PM  CNavy Yard CityMAIN RColumbus [facility] Healthcare SystemSERVICES [address]RBigl[address] [place] [address]Phone: [phone]  Fax:  [phone] Name: Stuart DAUBERTMRN: [id number] of Birth: [birth year]

## 2019-01-25 ENCOUNTER — Ambulatory Visit: Payer: Medicare Other

## 2019-01-26 ENCOUNTER — Ambulatory Visit: Payer: Medicare Other

## 2019-01-27 ENCOUNTER — Ambulatory Visit: Payer: Medicare Other

## 2019-01-31 ENCOUNTER — Other Ambulatory Visit: Payer: Self-pay

## 2019-01-31 ENCOUNTER — Ambulatory Visit: Payer: Medicare Other

## 2019-01-31 VITALS — BP 165/75 | HR 77

## 2019-01-31 DIAGNOSIS — M6281 Muscle weakness (generalized): Secondary | ICD-10-CM

## 2019-01-31 DIAGNOSIS — R2681 Unsteadiness on feet: Secondary | ICD-10-CM

## 2019-01-31 NOTE — Therapy (Signed)
[place] MAIN [facility] SERVICES [address] [place], [place], [address] Phone: [phone]   Fax:  [phone]  Physical Therapy Treatment/Discharge  Patient Details  Name: Stuart Smith MRN: [id number] Date of Birth: 06-09-1927 Referring Provider (PT): Dr. Kary Kos   Encounter Date: 01/31/2019    Past Medical History:  Diagnosis Date  . Cataracts, bilateral   . Depression   . Elevated PSA   . H/O seasonal allergies   . History of chickenpox   . Hypertension   . Prostate cancer [facility]) 2013   Surgery   . Torticollis     Past Surgical History:  Procedure Laterality Date  . BACK SURGERY  1995  . GALLBLADDER SURGERY    . PROSTATE SURGERY  2013    Vitals:   01/31/19 0847  BP: (!) 165/75  Pulse: 77  SpO2: 97%    Subjective Assessment - 01/31/19 0847    Subjective  Patient reports he has been doing well. No falls or LOB. No pain. HEP is going well. He is wanting to stop his appointments at this time due to ongoing concerns regarding COVID-19, his health, and his wife's health.     Pertinent History  Pt reports that he was washing his head in the sink on 08/19/18 and had a severe bout of dizziness. He reports feeling "totally disoriented." Unclear if he had any vertigo. He states that after he brought his head back up the dizziness resolved. Then the following Sunday he went to stand up during communion at church and got very dizzy. He came to the [facility] ER and was worked up for a CVA. MRI negative for acute changes and carotid ultrasound showed less than 50% stenosis of the internal carotid arteries bilaterally. Pt was kept overnight for observation and his dizziness persisted into the morning. Pt states that he was seen by physical therapist at the hospital who "threw me around" and it "helped a little bit." Pt was also treated with oral prednisone and meclizine and his symptoms improved. Pt reports that his symptoms are mostly resolved since  that time with the exception of some unsteadiness. Pt denies any head trauma or MVA. PMH includes prostate cancer x 6 years for which he has been treated and has resulted in some weakness. Next Friday he will be having botox injections for his chronic torticollis. Prior history of low back surgery. Pt reports that ~1 year ago he had an episode of "haziness" during church, "then everything turned gray" and pt could not stand up. That cleared on it's own after going home and resting. Pt reports some "thickness" feeling on the underside of his feet.     Limitations  Walking    Diagnostic tests  MRI negative for acute changes and carotid ultrasound showed less than 50% stenosis of the internal carotid arteries bilaterally.    Patient Stated Goals  Improve balance    Currently in Pain?  No/denies           Patient arrived today but states that he doesn't want to proceed with his visit. He states that he is doing very well and believes that therapy has been very helpful. He is wanting to stop his appointments at this time due to ongoing concerns regarding COVID-19, his health, and his wife's health. Therapist reviewed HEP with patient. Pt would like to return in the future to work on his neck strength and range of motion. Over the course of therapy pt made dramatic improvements  in his outcome measures. At the initial evaluation pt was unable to perform a single sit to stand without BUE assistance. On 01/17/19 he was able to perform 5TSTS in 17.2s. His DGI improved from 16/24 to 24/24 and his 70mgait speed improved from 0.79 m/s to 1.23 m/s. His single leg balance improved from 7-9 seconds bilaterally to greater than 20 seconds bilaterally and his ABC went from 5.25% to 68.125%. His minBEST improved from 20/28 in January to 24/28 on 01/17/19. Pt will be discharged at this time based on his own preference due to ongoing concerns regarding his exposure to COVID-19.                       PT  Short Term Goals - 01/17/19 0904      PT SHORT TERM GOAL #1   Title  Pt will be independent with HEP in order to improve strength and balance in order to decrease fall risk and improve function at home and work.     Time  3    Period  Weeks    Status  Achieved        PT Long Term Goals - 01/17/19 03614     PT LONG TERM GOAL #1   Title  Pt will improve DGI by at least 3 points in order to demonstrate clinically significant improvement in balance and decreased risk for falls.    Baseline  09/01/18: 16/24; 10/05/18: 22/24; 11/04/18: 24/24    Time  6    Period  Weeks    Status  Achieved      PT LONG TERM GOAL #2   Title  Pt will be able to perform sit to stand without UE support in order to improve functional independence with transfers    Baseline  09/01/18: unable to perform; 10/05/18: patient able to perform from standard chair with UE reach; 11/04/18: 5TSTS in 17.1 seconds    Time  6    Period  Weeks    Status  Achieved      PT LONG TERM GOAL #3   Title  Pt will increase 10MWT by at least 0.13 m/s in order to demonstrate clinically significant improvement in community ambulation.    Baseline  09/01/18: self-selected: 14.4s = 0.69 m/s, Fastest: 12.7s = 0.79 m/sec; 10/05/18: self-selected: 12.2s= 0.872m fastest: 9.2s= 1.03 m/s; 11/04/18: self-selected: 12.2s= 0.8248mfastest: 8.1s= 1.23 m/s    Time  6    Period  Weeks    Status  Achieved      PT LONG TERM GOAL #4   Title  Patient will demonstrate improved BLE strength and balance as evidenced by his ability to independently perform 5TSTS without UE support or UE reach from a standard height chair without LOB in order to decrease risk of falls at home and in the community.    Baseline  10/05/2018: UE reach, SBA/CGA for increased repetitions; 11/04/18: 17.1s     Time  4    Period  Weeks    Status  Achieved      PT LONG TERM GOAL #5   Title  Patient will demonstrate step-through gait pattern independently when descending stairs  without UE support to improve mobility in the community and optimize independence.    Baseline  10/05/2018: step-to with LUE support; 11/04/18: Able to perform without UE support with reciprocal pattern    Time  4    Period  Weeks    Status  Achieved  PT LONG TERM GOAL #6   Title  Pt will decrease 5TSTS by at least 3 seconds in order to demonstrate clinically significant improvement in LE strength     Baseline  09/01/18: unable to perform; 10/05/18: patient able to perform from standard chair with UE reach; 11/04/18: 17.1 seconds; 12/16/18: 15.5s; 01/17/19: 17.2s    Time  8    Period  Weeks    Status  Partially Met    Target Date  03/16/19      PT LONG TERM GOAL #7   Title  Pt will be able to perform single leg balance for greater than 12 seconds on each leg in order to improve balance and decrease his risk for falls    Baseline  11/04/18: 7-9 seconds on both LEs; 12/16/18: LLE: 9.4s average; RLE: 5.2s average; 01/17/19: >20s bilaterally    Time  8    Period  Weeks    Status  Achieved      PT LONG TERM GOAL #8   Title  Pt will improve ABC by at least 13% in order to demonstrate clinically significant improvement in balance confidence.     Baseline  12/16/18: 59.25%; 01/17/19: 68.125%    Time  12    Period  Weeks    Status  Partially Met    Target Date  03/10/19      PT LONG TERM GOAL  #9   TITLE  Pt will increase 6MWT by at least 77m(1658f in order to demonstrate clinically significant improvement in cardiopulmonary endurance and community ambulation     Baseline  12/16/18: 1090'; 01/17/19: 1165'    Time  12    Period  Weeks    Status  Partially Met    Target Date  03/10/19      PT LONG TERM GOAL  #10   TITLE  Pt will improve minBEST by at least 4 points in order to demonstrate clinically significant improvement in balance and decrease in fall risk    Baseline  12/16/18: 20/28; 01/17/19: 24/28    Time  12    Period  Weeks    Status  Achieved            Plan - 01/31/19 0906     Clinical Impression Statement  Patient arrived today but states that he doesn't want to proceed with his visit. He states that he is doing very well and believes that therapy has been very helpful. He is wanting to stop his appointments at this time due to ongoing concerns regarding COVID-19, his health, and his wife's health. Therapist reviewed HEP with patient. Pt would like to return in the future to work on his neck strength and range of motion. Over the course of therapy pt made dramatic improvements in his outcome measures. At the initial evaluation pt was unable to perform a single sit to stand without BUE assistance. On 01/17/19 he was able to perform 5TSTS in 17.2s. His DGI improved from 16/24 to 24/24 and his 1016mit speed improved from 0.79 m/s to 1.23 m/s. His single leg balance improved from 7-9 seconds bilaterally to greater than 20 seconds bilaterally and his ABC went from 5.25% to 68.125%. His minBEST improved from 20/28 in January to 24/28 on 01/17/19. Pt will be discharged at this time based on his own preference due to ongoing concerns regarding his exposure to COVID-19.     Rehab Potential  Good    Clinical Impairments Affecting Rehab Potential  (+) symptoms  have resolved    PT Frequency  2x / week    PT Duration  12 weeks    PT Treatment/Interventions  ADLs/Self Care Home Management;Aquatic Therapy;Canalith Repostioning;Cryotherapy;Electrical Stimulation;Iontophoresis 46m/ml Dexamethasone;Moist Heat;Traction;Ultrasound;DME Instruction;Gait training;Stair training;Functional mobility training;Therapeutic activities;Therapeutic exercise;Balance training;Neuromuscular re-education;Cognitive remediation;Patient/family education;Manual techniques;Passive range of motion;Dry needling;Vestibular    PT Next Visit Plan  Discharge    PT Home Exercise Plan  Sit to stand without UE support, mini squast, tandem and single leg balance;    Consulted and Agree with Plan of Care  Patient        Patient will benefit from skilled therapeutic intervention in order to improve the following deficits and impairments:  Abnormal gait, Decreased balance, Decreased strength, Difficulty walking  Visit Diagnosis: Muscle weakness (generalized)  Unsteadiness on feet     Problem List Patient Active Problem List   Diagnosis Date Noted  . Dizziness 08/22/2018  . Gait instability 10/27/2016  . Essential hypertension 07/25/2014  . Atherosclerosis of abdominal aorta ([place] 05/19/2013  . Abnormal EKG 08/09/2012  . Prostate cancer ([place] 08/09/2012   JPhillips GroutPT, DPT, GCS  Klark Vanderhoef 01/31/2019, 9:17 AM  CCabana ColonyMAIN REdward PlainfieldSERVICES [address] [address]Phone: [phone]  Fax:  [phone] Name: Stuart CHECKMRN: [id number] of Birth: 109-04-1927

## 2019-02-01 ENCOUNTER — Ambulatory Visit: Payer: Medicare Other

## 2019-02-02 ENCOUNTER — Ambulatory Visit: Payer: Medicare Other

## 2019-02-07 ENCOUNTER — Ambulatory Visit: Payer: Medicare Other

## 2019-02-08 ENCOUNTER — Ambulatory Visit: Payer: Medicare Other

## 2019-02-09 ENCOUNTER — Ambulatory Visit: Payer: Medicare Other

## 2019-02-11 ENCOUNTER — Ambulatory Visit: Payer: Medicare Other

## 2019-02-14 ENCOUNTER — Ambulatory Visit: Payer: Medicare Other

## 2019-02-15 ENCOUNTER — Ambulatory Visit: Payer: Medicare Other

## 2019-02-16 ENCOUNTER — Ambulatory Visit: Payer: Medicare Other

## 2019-02-21 ENCOUNTER — Ambulatory Visit: Payer: Medicare Other

## 2019-02-22 ENCOUNTER — Ambulatory Visit: Payer: Medicare Other

## 2019-02-23 ENCOUNTER — Ambulatory Visit: Payer: Medicare Other

## 2019-02-24 ENCOUNTER — Ambulatory Visit: Payer: Medicare Other

## 2019-02-28 ENCOUNTER — Ambulatory Visit: Payer: Medicare Other

## 2019-03-01 ENCOUNTER — Ambulatory Visit: Payer: Medicare Other

## 2019-03-02 ENCOUNTER — Ambulatory Visit: Payer: Medicare Other

## 2019-03-03 ENCOUNTER — Ambulatory Visit: Payer: Medicare Other

## 2019-03-07 ENCOUNTER — Ambulatory Visit: Payer: Medicare Other

## 2019-03-15 ENCOUNTER — Ambulatory Visit: Payer: Medicare Other

## 2019-03-17 ENCOUNTER — Ambulatory Visit: Payer: Medicare Other

## 2019-05-14 IMAGING — CT CT HEAD CODE STROKE
3 series · 15 of 47 positions shown, 18 images · non-contrast
Comparison: None.

ADDENDUM:
Study discussed by telephone with Dr. [HOSPITAL] KOYO on 08/22/2018
at 5558 hours.
CLINICAL DATA: Code stroke. 89-year-old male with sudden onset
dizziness.

EXAM:
CT HEAD WITHOUT CONTRAST
TECHNIQUE: Contiguous axial images were obtained from the base of the skull
through the vertex without intravenous contrast.

[Series 2: head wo · axial · 0.41mm/px · z∈[-42,+83]mm · 9 of 30 slices shown, 12 images]
[im 3/30  brain]
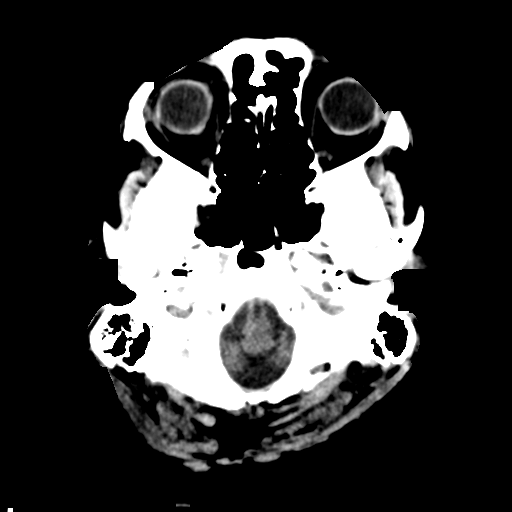
[im 3/30  bone]
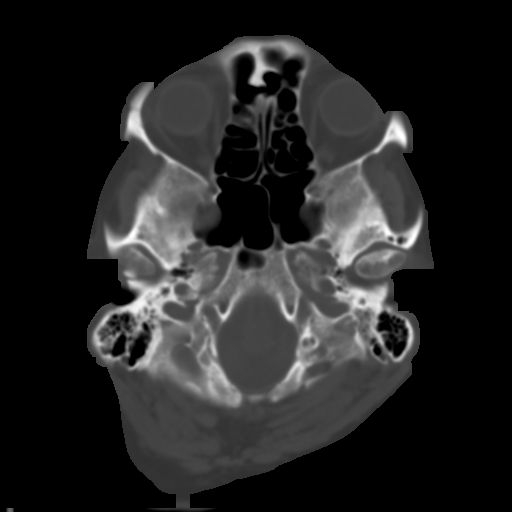
[im 6/30  brain]
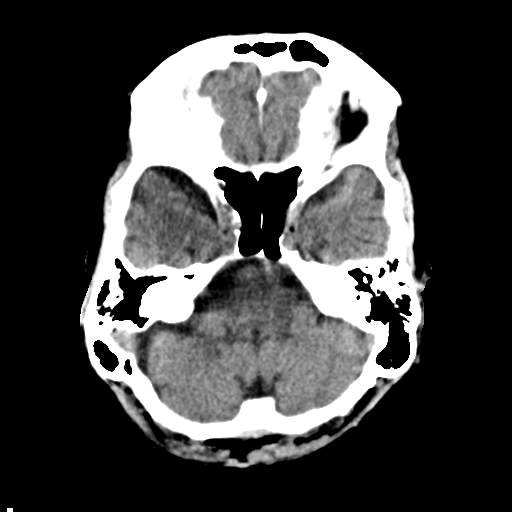
[im 9/30  brain]
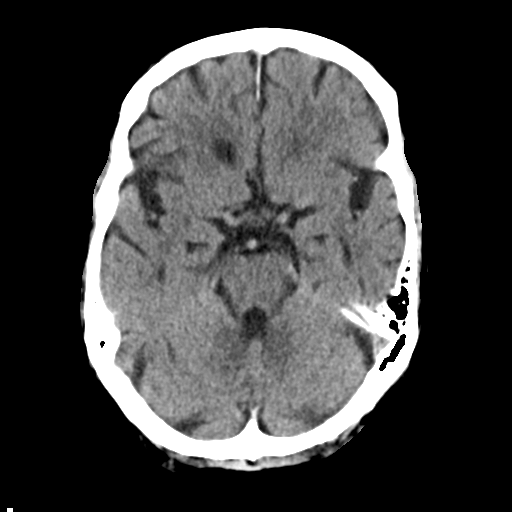
[im 12/30  brain]
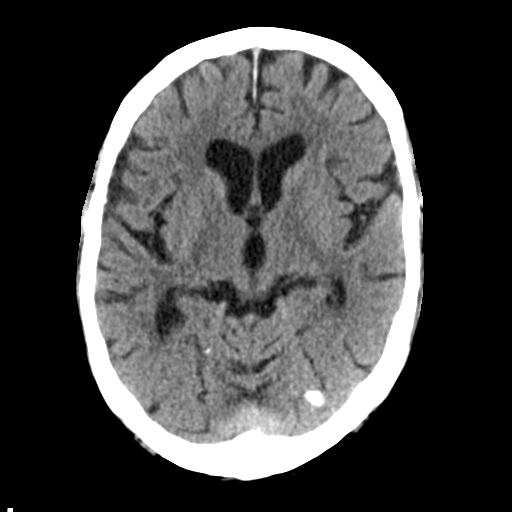
[im 16/30  brain]
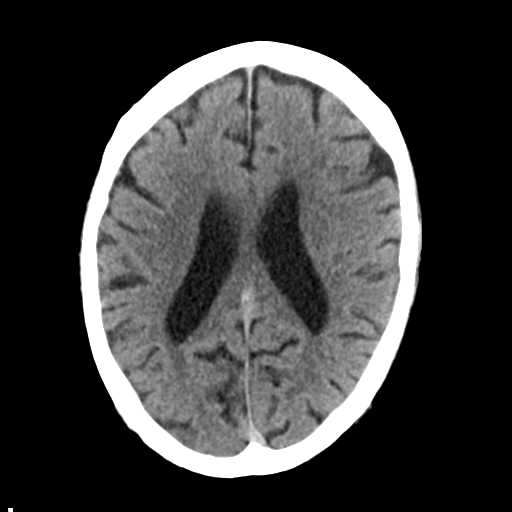
[im 16/30  bone]
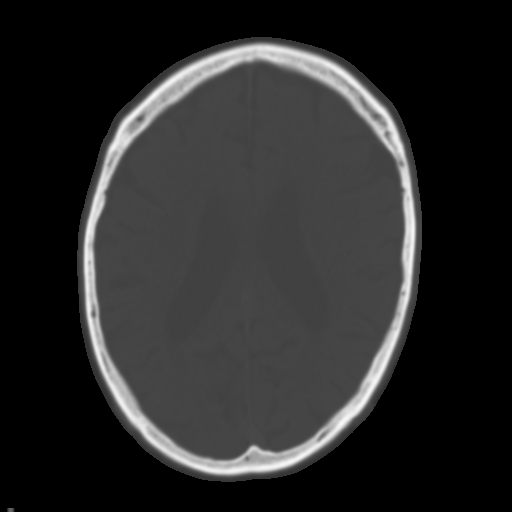
[im 19/30  brain]
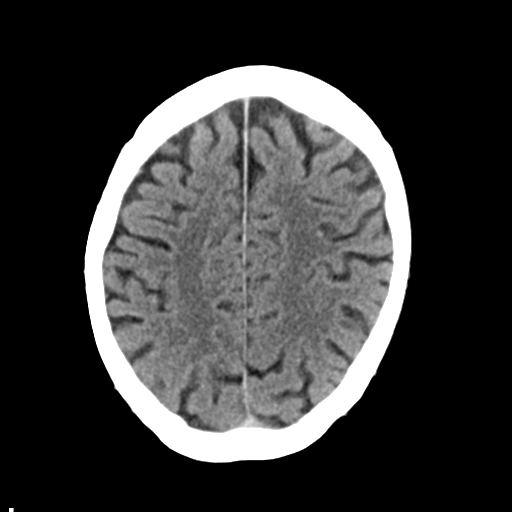
[im 22/30  brain]
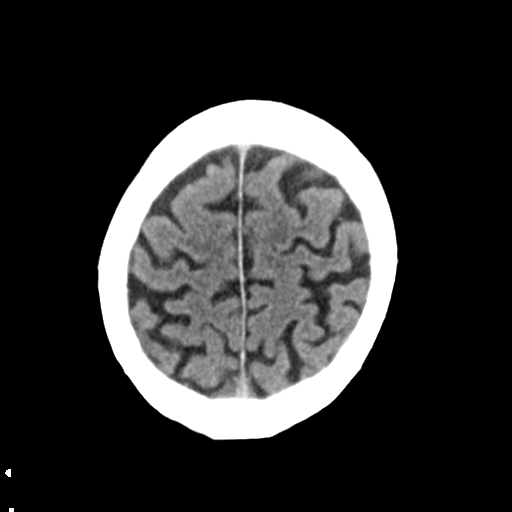
[im 25/30  brain]
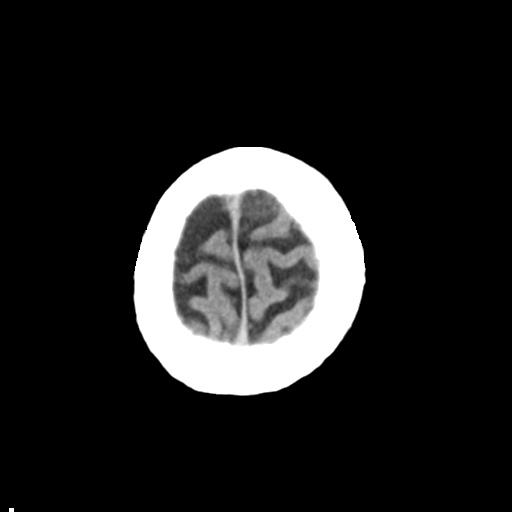
[im 28/30  brain]
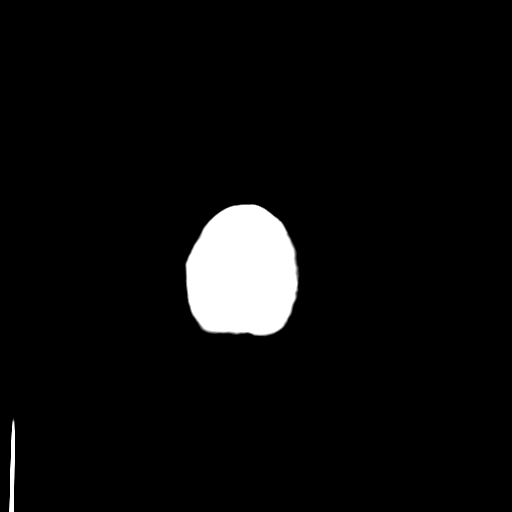
[im 28/30  bone]
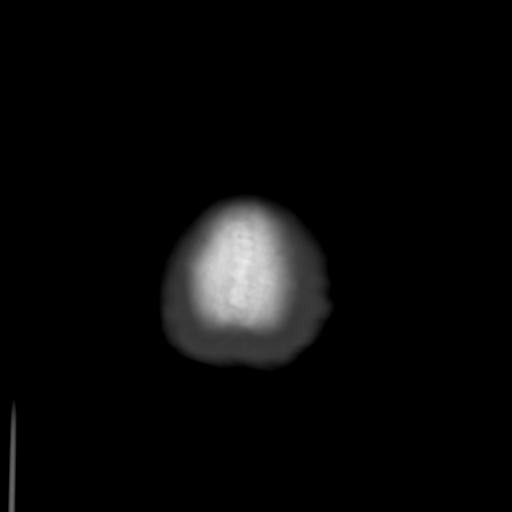

[Series 4: coronal soft tissue · coronal · 0.31mm/px · 3 of 62 slices shown]
[im 21/62  brain]
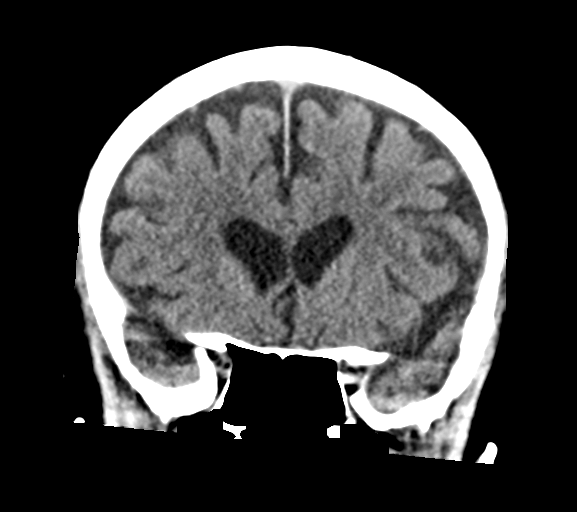
[im 28/62  brain]
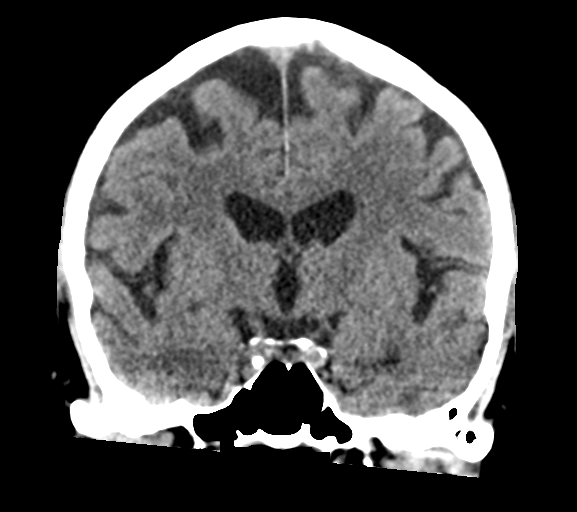
[im 34/62  brain]
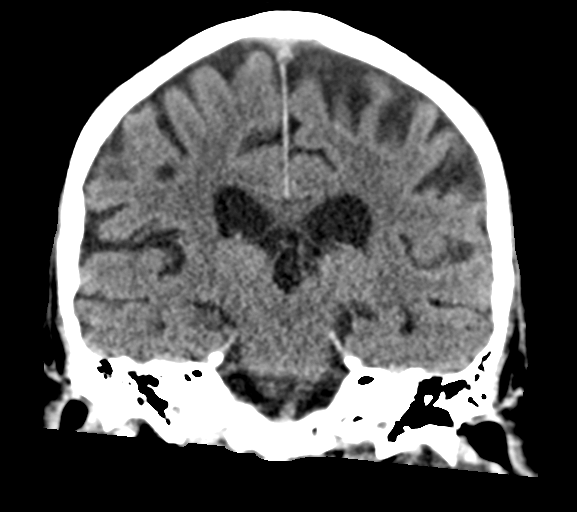

[Series 5: sagittal soft tissue · sagittal · 0.31mm/px · 3 of 51 slices shown]
[im 17/51  brain]
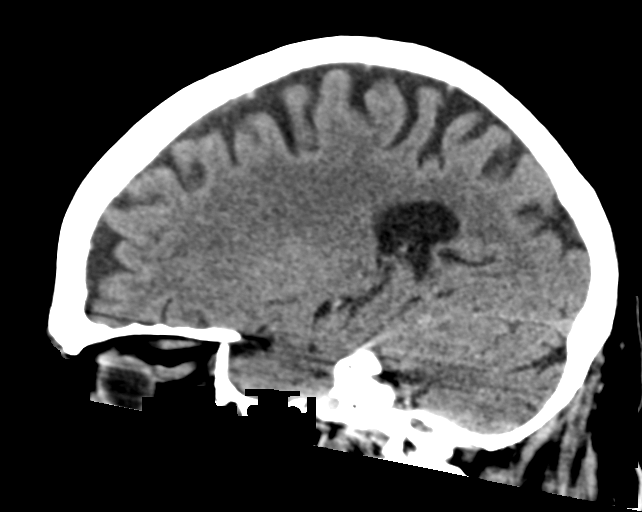
[im 26/51  brain]
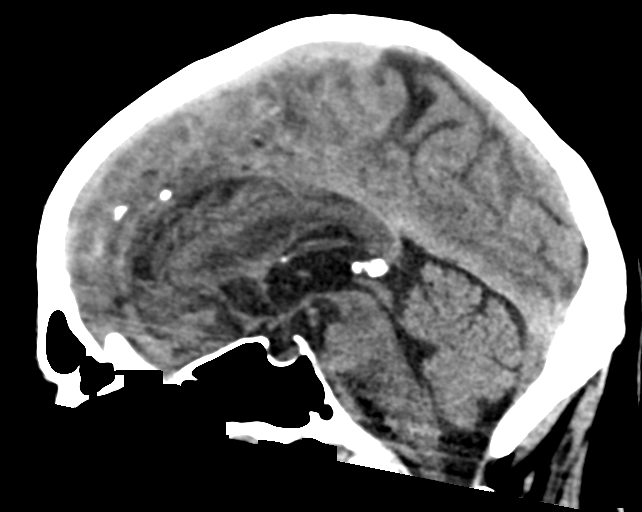
[im 34/51  brain]
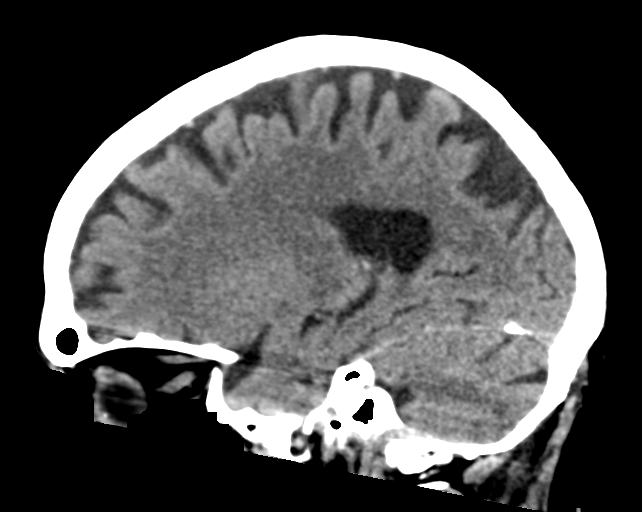

[15 of 47 positions shown; findings below may reference images not displayed]

FINDINGS: Brain: Cerebral volume is within normal limits for age. No midline
shift, ventriculomegaly, mass effect, evidence of mass lesion,
intracranial hemorrhage or evidence of cortically based acute
infarction. Gray-white matter differentiation is normal for age
throughout the brain. No cortical encephalomalacia identified.

Vascular: Calcified atherosclerosis at the skull base. No suspicious
intracranial vascular hyperdensity.

Skull: Negative.

Sinuses/Orbits: Visualized paranasal sinuses and mastoids are well
pneumatized.

Other: Postoperative changes to both globes. Negative scalp soft
tissues.

ASPECTS (Alberta Stroke Program Early CT Score)

- Ganglionic level infarction (caudate, lentiform nuclei, internal
capsule, insula, M1-M3 cortex): 7

- Supraganglionic infarction (M4-M6 cortex): 3

Total score (0-10 with 10 being normal): 10
IMPRESSION: 1. Normal for age non contrast CT appearance of the brain.
2. ASPECTS is 10.

## 2019-05-14 IMAGING — US US CAROTID DUPLEX BILAT
1 series · 13 of 24 positions shown · non-contrast
Comparison: None.

CLINICAL DATA: Ataxia

EXAM:
BILATERAL CAROTID DUPLEX ULTRASOUND
TECHNIQUE: Gray scale imaging, color Doppler and duplex ultrasound were
performed of bilateral carotid and vertebral arteries in the neck.

[Series 1: us carotid duplex bilat · 13 of 66 slices shown]
[im 1/66]
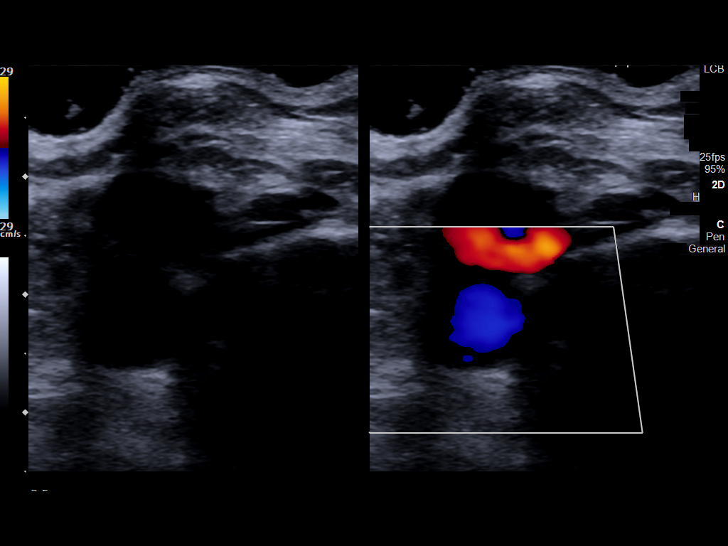
[im 6/66]
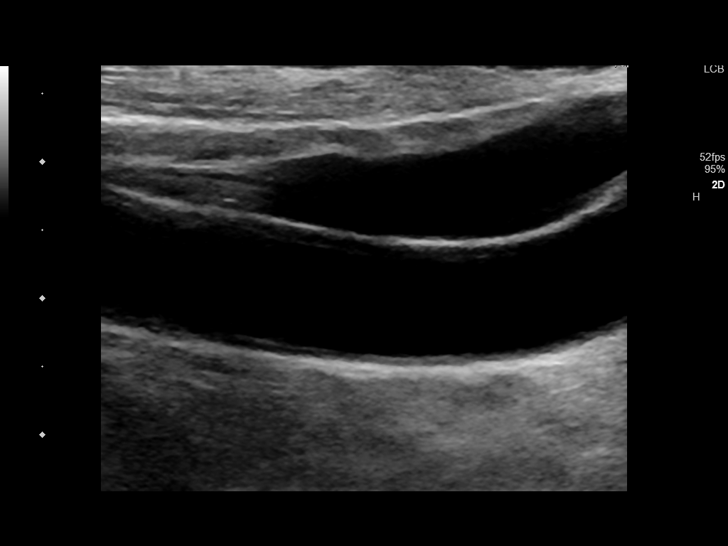
[im 12/66]
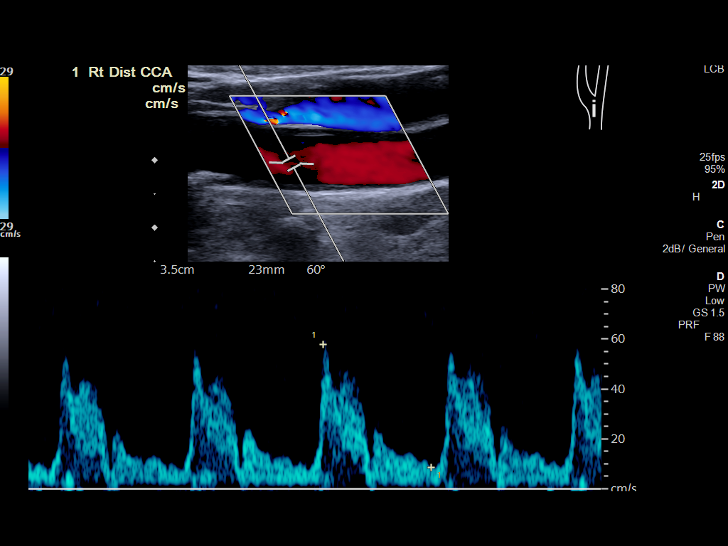
[im 17/66]
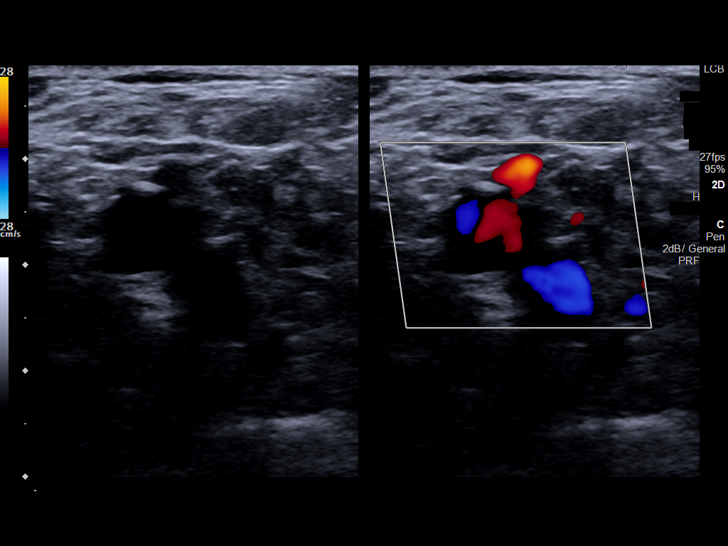
[im 23/66]
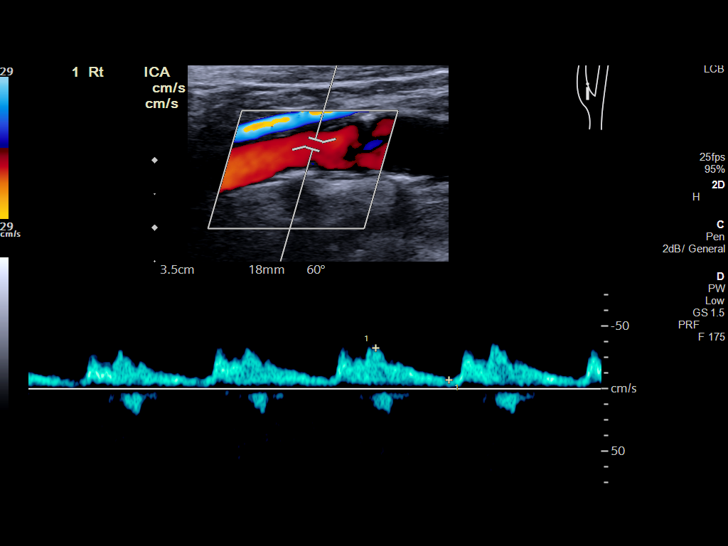
[im 29/66]
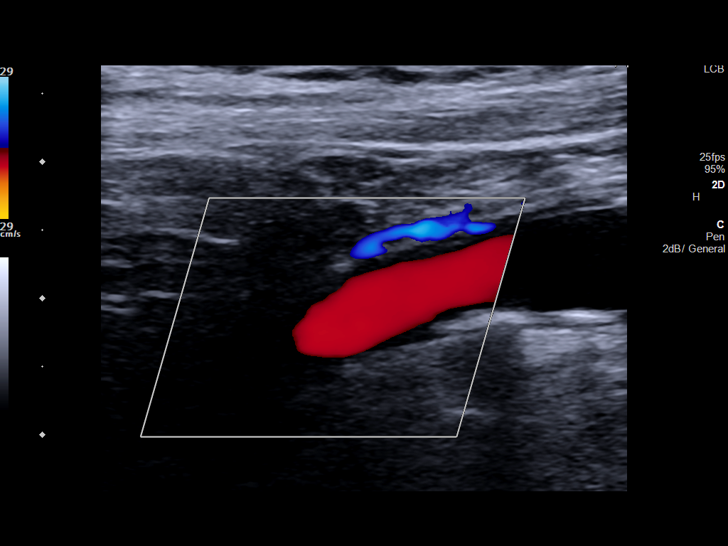
[im 34/66]
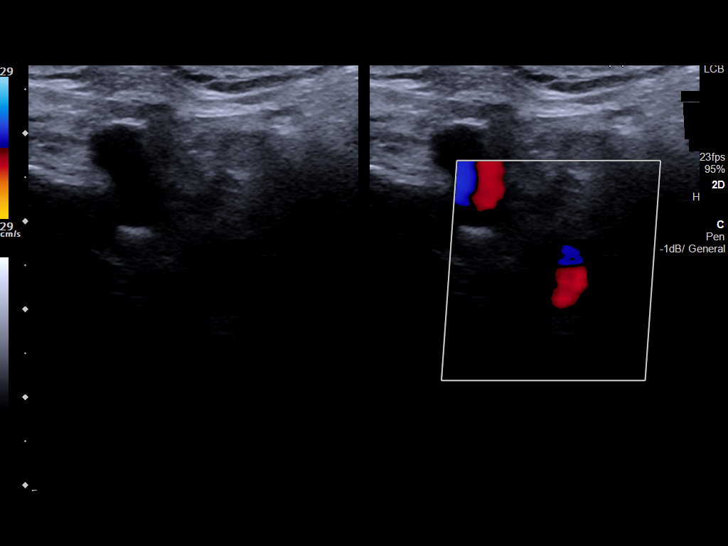
[im 37/66]
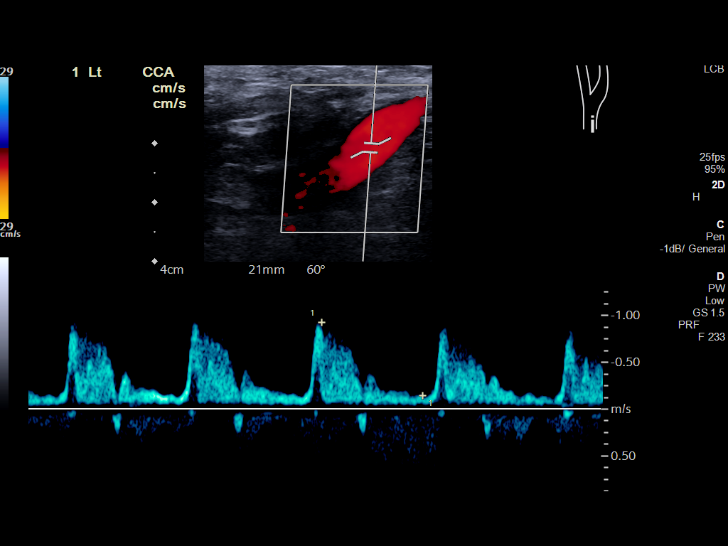
[im 43/66]
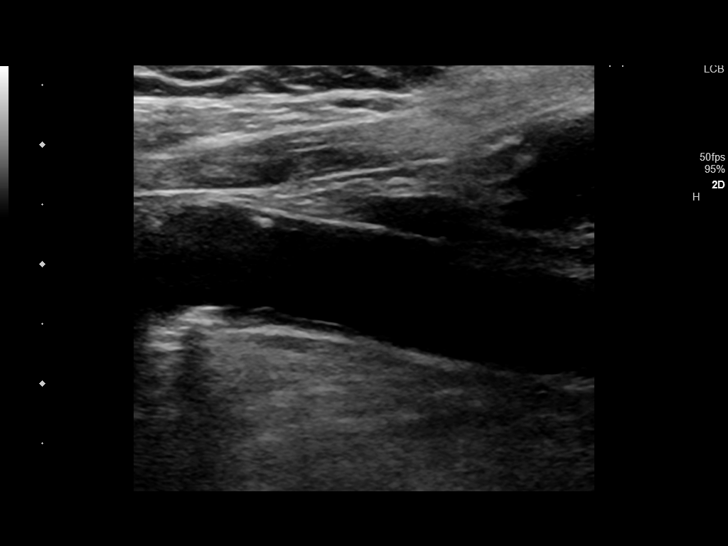
[im 49/66]
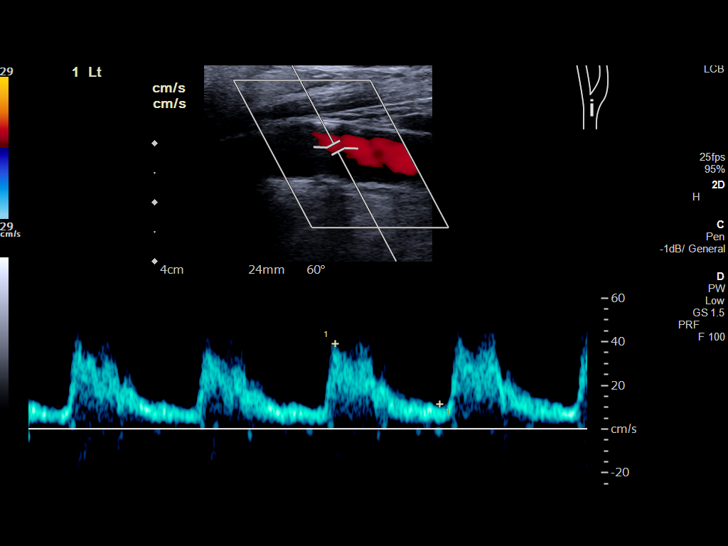
[im 54/66]
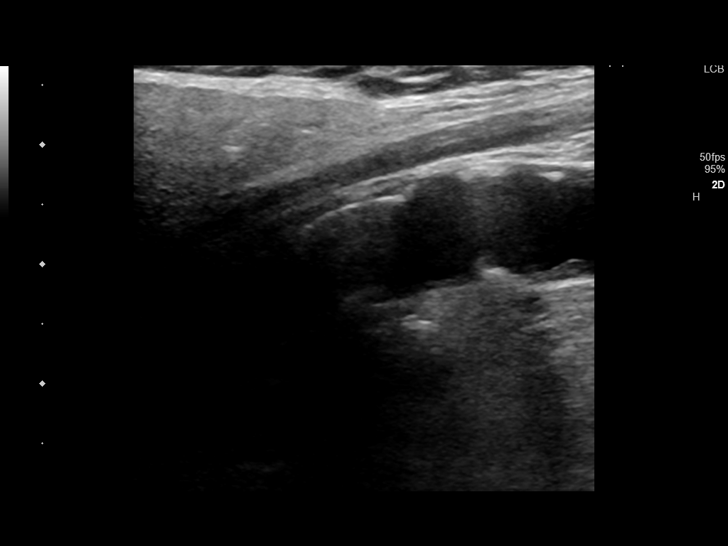
[im 60/66]
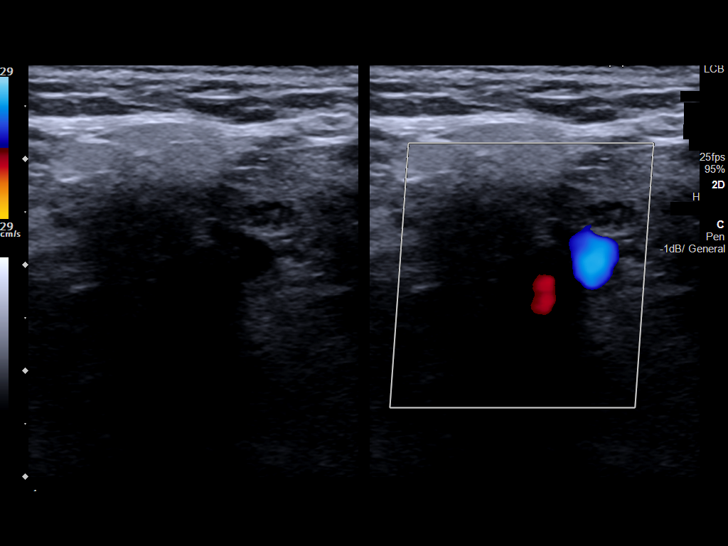
[im 66/66]
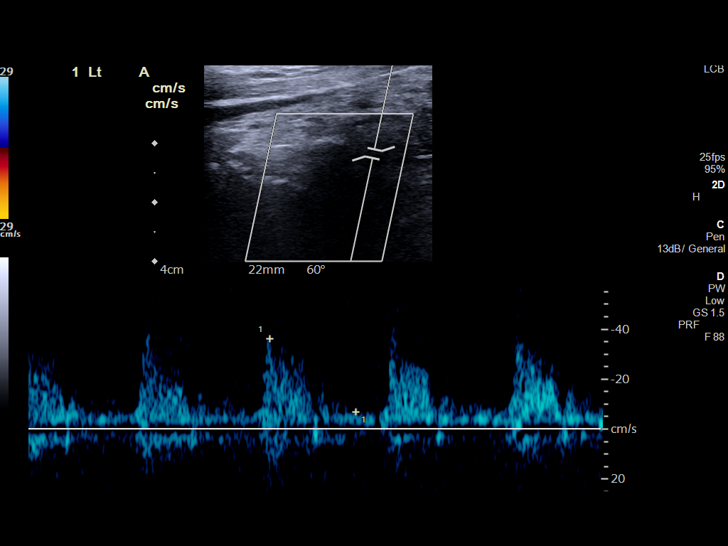

[13 of 24 positions shown; findings below may reference images not displayed]

FINDINGS: Criteria: Quantification of carotid stenosis is based on velocity
parameters that correlate the residual internal carotid diameter
with NASCET-based stenosis levels, using the diameter of the distal
internal carotid lumen as the denominator for stenosis measurement.

The following velocity measurements were obtained:

RIGHT

ICA: Peak systolic velocity 47 cm/sec and end-diastolic velocity 14
cm/sec

CCA: Peak systolic velocity 48 cm/sec and end-diastolic velocity 9
cm/sec

SYSTOLIC ICA/CCA RATIO:  1

ECA: 92 cm/sec

LEFT

ICA: Peak systolic velocity 72 cm/sec and end-diastolic velocity 19
cm/sec

CCA: Peak systolic velocity 60 cm/sec and end-diastolic velocity 8
cm/sec

SYSTOLIC ICA/CCA RATIO:

ECA: Peak systolic velocity 83 cm/sec

RIGHT CAROTID ARTERY: Mild scattered soft and hard plaque at the
carotid bulb and proximal ICA

RIGHT VERTEBRAL ARTERY:  Antegrade

LEFT CAROTID ARTERY: Mild soft and hard plaque in the carotid bulb
and proximal ICA

LEFT VERTEBRAL ARTERY:  Antegrade

Upper extremity blood pressures: RIGHT: NA LEFT: NA
IMPRESSION: Less than 50% stenosis of the internal carotid arteries bilaterally.
Antegrade flow in both vertebral arteries.

## 2019-06-09 ENCOUNTER — Telehealth: Payer: Self-pay | Admitting: Cardiovascular Disease

## 2019-06-09 NOTE — Telephone Encounter (Signed)

## 2019-06-10 ENCOUNTER — Other Ambulatory Visit: Payer: Self-pay

## 2019-06-10 ENCOUNTER — Ambulatory Visit (INDEPENDENT_AMBULATORY_CARE_PROVIDER_SITE_OTHER): Payer: Medicare Other | Admitting: Cardiovascular Disease

## 2019-06-10 VITALS — BP 130/80 | HR 82 | Ht 69.5 in | Wt 184.8 lb

## 2019-06-10 DIAGNOSIS — I493 Ventricular premature depolarization: Secondary | ICD-10-CM | POA: Diagnosis not present

## 2019-06-10 DIAGNOSIS — I10 Essential (primary) hypertension: Secondary | ICD-10-CM

## 2019-06-10 DIAGNOSIS — I6523 Occlusion and stenosis of bilateral carotid arteries: Secondary | ICD-10-CM

## 2019-06-10 DIAGNOSIS — E782 Mixed hyperlipidemia: Secondary | ICD-10-CM

## 2019-06-10 DIAGNOSIS — I700 Atherosclerosis of aorta: Secondary | ICD-10-CM

## 2019-06-10 NOTE — Progress Notes (Signed)
Cardiology Office Note  Date:  06/10/2019   ID:  Stuart Smith, DOB [birth year], MRN [id number]  PCP:  Stuart Pink, MD   Chief Complaint  Patient presents with  . other    12 month follow up. patient denies chest pain and SOB. Meds reviewed verbally with patient.     HPI:  Stuart Smith is a 85 year old gentleman with a  long history of prostate issues, s/p TURP,  found to have prostate cancer, status post prostate resection,  [facility] lives at [facility] [place]  Mild carotid and aortic atherosclerosis who presents for routine followup of his abdominal aortic atherosclerosis  Vestibular problem 08/22/2018 Stuart Smith to hospital Carotid u/s showing B/l Mild soft and hard plaque in the carotid bulb and proximal ICA --- Worked with vestibular PT, symptoms much improved  Plays golf, active at baseline Active at [employer] Denies any significant shortness of breath or chest pain on exertion  He does not appreciate PVCs, denies any palpitations  Reports weight gain from Lupron treatment in the past, prostate cancer  EKG personally reviewed by myself on todays visit Shows normal sinus rhythm rate 82 bpm with PVCs, right bundle branch block  Other past medical history reviewed Severe reaction left arm, After PNA short  Had near syncope Has picture of left arm, swollen, erythematous shoulder to wrist Started to imprve in one week, Month to get better,    unable to complete the hormone therapy, last round of treatment was January 2015. He was unable to receive the radioactive seeds as he had a previous TURP  Previous bone scan showed abdominal aortic atherosclerosis.  also previously seen on x-ray earlier.   Bone scan done 05/05/2013, total abdominal aortic atherosclerosis but does not indicate the extent  Echo 08/2018 - Left ventricle: The cavity size was normal. There was moderate   concentric hypertrophy. Systolic function was normal. The   estimated ejection fraction was in the  range of 55% to 60%. Wall   motion was normal; there were no regional wall motion   abnormalities. Doppler parameters are consistent with abnormal   left ventricular relaxation (grade 1 diastolic dysfunction). - Aortic valve: There was trivial regurgitation. - Mitral valve: Calcified annulus. There was mild regurgitation.   PMH:   has a past medical history of Cataracts, bilateral, Depression, Elevated PSA, H/O seasonal allergies, History of chickenpox, Hypertension, Prostate cancer ([place]) (2013), and Torticollis.  PSH:    Past Surgical History:  Procedure Laterality Date  . BACK SURGERY  1995  . GALLBLADDER SURGERY    . PROSTATE SURGERY  2013    Current Outpatient Medications  Medication Sig Dispense Refill  . aspirin 81 MG tablet Take 81 mg by mouth daily.    . cetirizine (ZYRTEC) 10 MG tablet Take 10 mg by mouth daily.    . clonazePAM (KLONOPIN) 0.5 MG tablet Take 0.5 mg by mouth 3 (three) times daily.    . Flaxseed Oil OIL Take by mouth.    . latanoprost (XALATAN) 0.005 % ophthalmic solution INSTILL 1 DROP INTO EACH EYE AT BEDTIME    . Leuprolide Acetate, 6 Month, (LUPRON DEPOT, 34-MONTH,) 45 MG injection Inject 45 mg into the muscle every 6 (six) months.    Marland Kitchen lisinopril (PRINIVIL,ZESTRIL) 10 MG tablet Take 10 mg by mouth daily.    . Magnesium 400 MG TABS Take 400 mg by mouth daily.    . Multiple Vitamin (MULTIVITAMIN) capsule Take 1 capsule by mouth daily.    . traZODone (DESYREL) 50  MG tablet Take 50 mg by mouth at bedtime.    . vitamin C (ASCORBIC ACID) 500 MG tablet Take 500 mg by mouth daily.      No current facility-administered medications for this visit.      Allergies:   Keflex [cephalexin]   Social History:  The patient  reports that he has never smoked. He has never used smokeless tobacco. He reports current alcohol use of about 1.0 standard drinks of alcohol per week. He reports that he does not use drugs.   Family History:   Family history is unknown by  patient.    Review of Systems: Review of Systems  Constitutional: Negative.   Respiratory: Negative.   Cardiovascular: Negative.   Gastrointestinal: Negative.   Musculoskeletal: Negative.        Unsteady gait  Neurological: Negative.   Psychiatric/Behavioral: Negative.   All other systems reviewed and are negative.   PHYSICAL EXAM: VS:  BP 130/80 (BP Location: Right Arm, Patient Position: Sitting, Cuff Size: Normal)   Pulse 82   Ht 5' 9.5" (1.765 m)   Wt 184 lb 12 oz (83.8 kg)   BMI 26.89 kg/m  , BMI Body mass index is 26.89 kg/m. Constitutional:  oriented to person, place, and time. No distress.  HENT:  Head: Grossly normal Eyes:  no discharge. No scleral icterus.  Neck: No JVD, no carotid bruits  Cardiovascular: Regular rate and rhythm, no murmurs appreciated Pulmonary/Chest: Clear to auscultation bilaterally, no wheezes or rails Abdominal: Soft.  no distension.  no tenderness.  Musculoskeletal: Normal range of motion Neurological:  normal muscle tone. Coordination normal. No atrophy Skin: Skin warm and dry Psychiatric: normal affect, pleasant   Recent Labs: 08/22/2018: ALT 17 08/23/2018: BUN 20; Creatinine, Ser 0.80; Hemoglobin 12.6; Platelets 187; Potassium 4.7; Sodium 134    Lipid Panel No results found for: CHOL, HDL, LDLCALC, TRIG    Wt Readings from Last 3 Encounters:  06/10/19 184 lb 12 oz (83.8 kg)  08/22/18 188 lb 4.4 oz (85.4 kg)  01/08/18 186 lb (84.4 kg)      ASSESSMENT AND PLAN:  Atherosclerosis of abdominal aorta (HCC) Previously seen on bone scan, declined statin No further follow-up imaging needed  Essential hypertension - Plan: EKG 12-Lead Blood pressure is well controlled on today's visit. No changes made to the medications.  Stable.  He will monitor at home  PVC No symptoms Seen on EKG today, does not want further work-up reports he is asymptomatic Does not want a beta-blocker  Carotid stenosis Mild disease seen on  ultrasound  Gait instability No falls, recommend he continue regular walking program    Total encounter time more than 25 minutes  Greater than 50% was spent in counseling and coordination of care with the patient   Disposition:   F/U  12 months as needed   No orders of the defined types were placed in this encounter.    Signed, Esmond Plants, M.D., Ph.D. 06/10/2019  Stuart Smith, Stuart Smith

## 2019-06-10 NOTE — Patient Instructions (Addendum)
Medication Instructions:  No changes  If you need a refill on your cardiac medications before your next appointment, please call your pharmacy.    Lab work: No new labs needed   If you have labs (blood work) drawn today and your tests are completely normal, you will receive your results only by: . MyChart Message (if you have MyChart) OR . A paper copy in the mail If you have any lab test that is abnormal or we need to change your treatment, we will call you to review the results.   Testing/Procedures: No new testing needed   Follow-Up: At [facility], you and your health needs are our priority.  As part of our continuing mission to provide you with exceptional heart care, we have created designated Provider Care Teams.  These Care Teams include your primary Cardiologist (physician) and Advanced Practice Providers (APPs -  Physician Assistants and Nurse Practitioners) who all work together to provide you with the care you need, when you need it.  . You will need a follow up appointment in 12 months as needed .   Please call our office 2 months in advance to schedule this appointment.    . Providers on your designated Care Team:   . Christopher Berge, NP . Ryan Dunn, PA-C . Jacquelyn Visser, PA-C  Any Other Special Instructions Will Be Listed Below (If Applicable).  For educational health videos Log in to : [email or web address] Or : [email or web address], password : [email or web address]   

## 2020-06-30 NOTE — Progress Notes (Signed)
Cardiology Office Note  Date:  07/02/2020   ID:  Stuart Smith, DOB Nov 22, 1926, MRN [id number]  PCP:  [place] Pink, MD   Chief Complaint  Patient presents with   Follow-up    12 month/ "doing well."     HPI:  Stuart Smith is a 85 year old gentleman with a  long history of prostate issues, s/p TURP,  found to have prostate cancer, status post prostate resection,  [facility] [place]  Mild carotid and aortic atherosclerosis who presents for routine followup of his abdominal aortic atherosclerosis  Last seen in clinic by myself July 2020  In follow-up today reports he is relatively active Golf once a week Works out at gym [employer] Denies significant shortness of breath or chest pain on exertion  Activity [facility] Denies any tachycardia or palpitations  EKG personally reviewed by myself on todays visit Shows normal sinus rhythm rate 72 bpm right bundle branch block   Other past medical history reviewed Vestibular problem 08/22/2018 hospital Carotid u/s showing B/l Mild soft and hard plaque in the carotid bulb and proximal ICA Did vestibular PT  Severe reaction left arm, After PNA short  Had near syncope Has picture of left arm, swollen, erythematous shoulder to wrist Started to imprve in one week, Month to get better,    unable to complete the hormone therapy, last round of treatment was January 2015. He was unable to receive the radioactive seeds as he had a previous TURP   Echo 08/2018 - Left ventricle: The cavity size was normal. There was moderate   concentric hypertrophy. Systolic function was normal. The   estimated ejection fraction was in the range of 55% to 60%. Wall   motion was normal; there were no regional wall motion   abnormalities. Doppler parameters are consistent with abnormal   left ventricular relaxation (grade 1 diastolic dysfunction). - Aortic valve: There was trivial regurgitation. - Mitral valve: Calcified annulus. There was mild  regurgitation.   PMH:   has a past medical history of Cataracts, bilateral, Depression, Elevated PSA, H/O seasonal allergies, History of chickenpox, Hypertension, Prostate cancer ([place]) (2013), and Torticollis.  PSH:    Past Surgical History:  Procedure Laterality Date   [place]  2013    Current Outpatient Medications  Medication Sig Dispense Refill   aspirin 81 MG tablet Take 81 mg by mouth daily.     cetirizine (ZYRTEC) 10 MG tablet Take 10 mg by mouth daily.     clonazePAM (KLONOPIN) 0.5 MG tablet Take 0.5 mg by mouth 3 (three) times daily.     DENTAGEL 1.1 % GEL dental gel Place onto teeth at bedtime.     latanoprost (XALATAN) 0.005 % ophthalmic solution INSTILL 1 DROP INTO EACH EYE AT BEDTIME     Leuprolide Acetate, 6 Month, (LUPRON DEPOT, 33-MONTH,) 45 MG injection Inject 45 mg into the muscle every 6 (six) months.     lisinopril (PRINIVIL,ZESTRIL) 10 MG tablet Take 10 mg by mouth daily.     Magnesium 400 MG TABS Take 400 mg by mouth daily.     Multiple Vitamin (MULTIVITAMIN) capsule Take 1 capsule by mouth daily.     Travoprost, BAK Free, (TRAVATAN) 0.004 % SOLN ophthalmic solution INSTILL 1 DROP INTO EACH EYE AT BEDTIME     traZODone (DESYREL) 50 MG tablet Take 50 mg by mouth at bedtime.     vitamin C (ASCORBIC ACID) 500 MG  tablet Take 500 mg by mouth daily.      No current facility-administered medications for this visit.     Allergies:   Keflex [cephalexin]   Social History:  The patient  reports that he has never smoked. He has never used smokeless tobacco. He reports current alcohol use of about 1.0 standard drink of alcohol per week. He reports that he does not use drugs.   Family History:   Family history is unknown by patient.    Review of Systems: Review of Systems  Constitutional: Negative.   HENT: Negative.   Respiratory: Negative.   Cardiovascular: Negative.   Gastrointestinal:  Negative.   Musculoskeletal: Negative.        Unsteady gait  Neurological: Negative.   Psychiatric/Behavioral: Negative.   All other systems reviewed and are negative.   PHYSICAL EXAM: VS:  BP 130/60 (BP Location: Left Arm, Patient Position: Sitting, Cuff Size: Normal)    Pulse 72    Ht 5\' 9"  (1.753 m)    Wt 174 lb 12 oz (79.3 kg)    SpO2 98%    BMI 25.81 kg/m  , BMI Body mass index is 25.81 kg/m. Constitutional:  oriented to person, place, and time. No distress.  HENT:  Head: Grossly normal Eyes:  no discharge. No scleral icterus.  Neck: No JVD, no carotid bruits  Cardiovascular: Regular rate and rhythm, no murmurs appreciated Pulmonary/Chest: Clear to auscultation bilaterally, no wheezes or rails Abdominal: Soft.  no distension.  no tenderness.  Musculoskeletal: Normal range of motion Neurological:  normal muscle tone. Coordination normal. No atrophy Skin: Skin warm and dry Psychiatric: normal affect, pleasant    Recent Labs: No results found for requested labs within last 8760 hours.    Lipid Panel No results found for: CHOL, HDL, LDLCALC, TRIG    Wt Readings from Last 3 Encounters:  07/02/20 174 lb 12 oz (79.3 kg)  06/10/19 184 lb 12 oz (83.8 kg)  08/22/18 188 lb 4.4 oz (85.4 kg)      ASSESSMENT AND PLAN:  Atherosclerosis of abdominal aorta (HCC) Previously seen on bone scan, Previously declined statin   Essential hypertension - Plan: EKG 12-Lead Mildly elevated on today's visit, recommend he monitor blood pressure at home and call us if this continues to run high  PVC No symptoms Seen on prior EKGs Not on beta-blocker  Carotid stenosis Mild disease seen on ultrasound Not on statin  Gait instability Recommended regular walking program, he is staying active    Total encounter time more than 25 minutes  Greater than 50% was spent in counseling and coordination of care with the patient    Orders Placed This Encounter  Procedures   EKG 12-Lead      Signed, Esmond Plants, M.D., Ph.D. 07/02/2020  [place], [place]

## 2020-07-02 ENCOUNTER — Encounter: Payer: Self-pay | Admitting: Cardiovascular Disease

## 2020-07-02 ENCOUNTER — Ambulatory Visit (INDEPENDENT_AMBULATORY_CARE_PROVIDER_SITE_OTHER): Payer: Medicare Other | Admitting: Cardiovascular Disease

## 2020-07-02 ENCOUNTER — Other Ambulatory Visit: Payer: Self-pay

## 2020-07-02 VITALS — BP 130/60 | HR 72 | Ht 69.0 in | Wt 174.8 lb

## 2020-07-02 DIAGNOSIS — E782 Mixed hyperlipidemia: Secondary | ICD-10-CM

## 2020-07-02 DIAGNOSIS — I739 Peripheral vascular disease, unspecified: Secondary | ICD-10-CM | POA: Diagnosis not present

## 2020-07-02 DIAGNOSIS — I10 Essential (primary) hypertension: Secondary | ICD-10-CM

## 2020-07-02 DIAGNOSIS — I6523 Occlusion and stenosis of bilateral carotid arteries: Secondary | ICD-10-CM

## 2020-07-02 DIAGNOSIS — I700 Atherosclerosis of aorta: Secondary | ICD-10-CM

## 2020-07-02 NOTE — Patient Instructions (Signed)

## 2020-08-29 ENCOUNTER — Ambulatory Visit (INDEPENDENT_AMBULATORY_CARE_PROVIDER_SITE_OTHER): Payer: Medicare Other | Admitting: Dermatology

## 2020-08-29 ENCOUNTER — Other Ambulatory Visit: Payer: Self-pay

## 2020-08-29 DIAGNOSIS — I872 Venous insufficiency (chronic) (peripheral): Secondary | ICD-10-CM | POA: Diagnosis not present

## 2020-08-29 DIAGNOSIS — I8311 Varicose veins of right lower extremity with inflammation: Secondary | ICD-10-CM | POA: Diagnosis not present

## 2020-08-29 DIAGNOSIS — I6523 Occlusion and stenosis of bilateral carotid arteries: Secondary | ICD-10-CM | POA: Diagnosis not present

## 2020-08-29 MED ORDER — TRIAMCINOLONE ACETONIDE 0.1 % EX CREA: TOPICAL_CREAM | CUTANEOUS | 0 refills | Status: DC

## 2020-08-29 NOTE — Patient Instructions (Signed)
Start Triamcinolone 0.1% cream to affected area twice per day for two weeks. Then decrease to once per day thereafter. Topical steroids (such as triamcinolone, fluocinolone, fluocinonide, mometasone, clobetasol, halobetasol, betamethasone, hydrocortisone) can cause thinning and lightening of the skin if they are used for too long in the same area. Your physician has selected the right strength medicine for your problem and area affected on the body. Please use your medication only as directed by your physician to prevent side effects.   Recommend knee high compression stockings of at least 15-58mmHg. Put on when you wake up and take off when you go to bed.

## 2020-08-29 NOTE — Progress Notes (Signed)
   Follow-Up Visit   Subjective  Stuart Smith is a 86 y.o. male who presents for the following: Other (Spot of right ankle that is flared up and discolored).  The following portions of the chart were reviewed this encounter and updated as appropriate:  Tobacco  Allergies  Meds  Problems  Med Hx  Surg Hx  Fam Hx     Review of Systems:  No other skin or systemic complaints except as noted in HPI or Assessment and Plan.  Objective  Well appearing patient in no apparent distress; mood and affect are within normal limits.  A focused examination was performed including the B/L lower legs. Relevant physical exam findings are noted in the Assessment and Plan.  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 Recommend compression stockings daily [place] pamphlet given. If not improved at follow up appointment recommend consider Intralesional steroid injections and / or ultrasound therapy.   triamcinolone cream (KENALOG) 0.1 % - B/L lower legs  Return in about 6 weeks (around 10/10/2020).  Luther Redo, CMA, am acting as scribe for Sarina Ser, MD .  Documentation: I have reviewed the above documentation for accuracy and completeness, and I agree with the above.  Sarina Ser, MD

## 2020-08-30 ENCOUNTER — Encounter: Payer: Self-pay | Admitting: Dermatology

## 2020-10-24 ENCOUNTER — Ambulatory Visit (INDEPENDENT_AMBULATORY_CARE_PROVIDER_SITE_OTHER): Payer: Medicare Other | Admitting: Dermatology

## 2020-10-24 ENCOUNTER — Other Ambulatory Visit: Payer: Self-pay

## 2020-10-24 DIAGNOSIS — I872 Venous insufficiency (chronic) (peripheral): Secondary | ICD-10-CM

## 2020-10-24 DIAGNOSIS — Z1283 Encounter for screening for malignant neoplasm of skin: Secondary | ICD-10-CM

## 2020-10-24 DIAGNOSIS — M793 Panniculitis, unspecified: Secondary | ICD-10-CM

## 2020-10-24 DIAGNOSIS — I6523 Occlusion and stenosis of bilateral carotid arteries: Secondary | ICD-10-CM | POA: Diagnosis not present

## 2020-10-24 DIAGNOSIS — B353 Tinea pedis: Secondary | ICD-10-CM | POA: Diagnosis not present

## 2020-10-24 DIAGNOSIS — L821 Other seborrheic keratosis: Secondary | ICD-10-CM

## 2020-10-24 DIAGNOSIS — L578 Other skin changes due to chronic exposure to nonionizing radiation: Secondary | ICD-10-CM

## 2020-10-24 DIAGNOSIS — D1800 Hemangioma unspecified site: Secondary | ICD-10-CM

## 2020-10-24 DIAGNOSIS — I8311 Varicose veins of right lower extremity with inflammation: Secondary | ICD-10-CM | POA: Diagnosis not present

## 2020-10-24 DIAGNOSIS — L814 Other melanin hyperpigmentation: Secondary | ICD-10-CM

## 2020-10-24 DIAGNOSIS — D229 Melanocytic nevi, unspecified: Secondary | ICD-10-CM

## 2020-10-24 MED ORDER — TRIAMCINOLONE ACETONIDE 0.1 % EX CREA: TOPICAL_CREAM | CUTANEOUS | 2 refills | Status: DC

## 2020-10-24 MED ORDER — KETOCONAZOLE 2 % EX CREA: TOPICAL_CREAM | CUTANEOUS | 2 refills | Status: DC

## 2020-10-24 NOTE — Progress Notes (Signed)
   Follow-Up Visit   Subjective  Stuart Smith is a 86 y.o. male who presents for the following: Annual Exam (yearly Mole check, ) and Follow-up (6 weeks f/u Lipodermatosclerosis treating with TMC with a good response ). The patient presents for Total-Body Skin Exam (TBSE) for skin cancer screening and mole check.  The following portions of the chart were reviewed this encounter and updated as appropriate:   Tobacco  Allergies  Meds  Problems  Med Hx  Surg Hx  Fam Hx     Review of Systems:  No other skin or systemic complaints except as noted in HPI or Assessment and Plan.  Objective  Well appearing patient in no apparent distress; mood and affect are within normal limits.  A full examination was performed including scalp, head, eyes, ears, nose, lips, neck, chest, axillae, abdomen, back, buttocks, bilateral upper extremities, bilateral lower extremities, hands, feet, fingers, toes, fingernails, and toenails. All findings within normal limits unless otherwise noted below.  Objective  Left Foot - Anterior: Scaling and maceration web spaces and over distal and lateral soles.   Objective  Right Lower Leg - Anterior: Violaceous patch    Assessment & Plan  Tinea pedis of left foot Left Foot - Anterior Chronic, persistent Ketoconazole cream 2% qhs if desired. Pt declines.   Lipodermatosclerosis of right lower extremity Chronic condition with expected duration over one year. There is no cure, only control. Condition is bothersome to patient. Not currently at goal. Right Lower Leg - Anterior Improving,  Cont graduated compressions daily   Cont TMC apply to legs qd-bid prn  Venous stasis dermatitis of both lower extremities Reordered Medications triamcinolone (KENALOG) 0.1 %  Lentigines - Scattered tan macules - Discussed due to sun exposure - Benign, observe - Call for any changes  Seborrheic Keratoses - Stuck-on, waxy, tan-brown papules and plaques  - Discussed  benign etiology and prognosis. - Observe - Call for any changes  Melanocytic Nevi - Tan-brown and/or pink-flesh-colored symmetric macules and papules - Benign appearing on exam today - Observation - Call clinic for new or changing moles - Recommend daily use of broad spectrum spf 30+ sunscreen to sun-exposed areas.   Hemangiomas - Red papules - Discussed benign nature - Observe - Call for any changes  Actinic Damage - Chronic, secondary to cumulative UV/sun exposure - diffuse scaly erythematous macules with underlying dyspigmentation - Recommend daily broad spectrum sunscreen SPF 30+ to sun-exposed areas, reapply every 2 hours as needed.  - Call for new or changing lesions.  Skin cancer screening performed today.  Return in about 1 year (around 10/24/2021).  IMarye Round, CMA, am acting as scribe for Sarina Ser, MD .  Documentation: I have reviewed the above documentation for accuracy and completeness, and I agree with the above.  Sarina Ser, MD

## 2020-10-24 NOTE — Patient Instructions (Addendum)
Start Cerave cream moisturizer daily or Amlactin  Rapid relief

## 2020-10-30 ENCOUNTER — Encounter: Payer: Self-pay | Admitting: Dermatology

## 2021-07-16 ENCOUNTER — Ambulatory Visit (INDEPENDENT_AMBULATORY_CARE_PROVIDER_SITE_OTHER): Payer: Medicare Other | Admitting: Cardiovascular Disease

## 2021-07-16 ENCOUNTER — Other Ambulatory Visit: Payer: Self-pay

## 2021-07-16 ENCOUNTER — Encounter: Payer: Self-pay | Admitting: Cardiovascular Disease

## 2021-07-16 VITALS — BP 164/68 | HR 85 | Ht 69.0 in | Wt 162.4 lb

## 2021-07-16 DIAGNOSIS — I6529 Occlusion and stenosis of unspecified carotid artery: Secondary | ICD-10-CM

## 2021-07-16 DIAGNOSIS — I739 Peripheral vascular disease, unspecified: Secondary | ICD-10-CM | POA: Diagnosis not present

## 2021-07-16 DIAGNOSIS — I10 Essential (primary) hypertension: Secondary | ICD-10-CM | POA: Diagnosis not present

## 2021-07-16 DIAGNOSIS — I700 Atherosclerosis of aorta: Secondary | ICD-10-CM | POA: Diagnosis not present

## 2021-07-16 MED ORDER — LISINOPRIL 10 MG PO TABS: 10.0000 mg | ORAL_TABLET | Freq: Every day | ORAL | 3 refills | Status: DC

## 2021-07-16 NOTE — Patient Instructions (Signed)
Medication Instructions:  Please continue your current medications  *If you need a refill on your cardiac medications before your next appointment, please call your pharmacy*  Lab Work: None  Testing/Procedures: None  Follow-Up: At [facility], you and your health needs are our priority.  As part of our continuing mission to provide you with exceptional heart care, we have created designated Provider Care Teams.  These Care Teams include your primary Cardiologist (physician) and Advanced Practice Providers (APPs -  Physician Assistants and Nurse Practitioners) who all work together to provide you with the care you need, when you need it.  Your next appointment:   12 month(s)  The format for your next appointment:   In Person  Provider:   You may see Ida Rogue, MD or one of the following Advanced Practice Providers on your designated Care Team:   Murray Hodgkins, NP Christell Faith, PA-C Marrianne Mood, PA-C Cadence Geneva, [place]

## 2021-07-16 NOTE — Progress Notes (Signed)
Cardiology Office Note  Date:  07/16/2021   ID:  Stuart Smith, DOB 1926/11/24, MRN [id number]  PCP:  Stuart Pink, MD   Chief Complaint  Patient presents with   63-monthfollow up     Patient c/o LE edema. Medications reviewed by the patient verbally.     HPI:  Mr. SMarinacciis a 87year old gentleman with a  long history of prostate issues, s/p TURP,  found to have prostate cancer, status post prostate resection,  [place] [facility] [place] Mild carotid and aortic atherosclerosis who presents for routine followup of his abdominal aortic atherosclerosis  Last seen in clinic by myself aug 2021 Had botox in neck, goes to [facility], higher dose this time Has pain for 10 days to 2 weeks, now having sx for third week out Muscle spasm Choking when trying to eat  In the past was active, less so recently Previously Golf once a week Previously working out at gym QOD, less so recently Balance having issues  Denies significant shortness of breath or chest pain on exertion  [place] or [place]  Labs reviewed Total chol, 172, LDL 92  BP elevated from pain  EKG personally reviewed by myself on todays visit Shows normal sinus rhythm rate 85 bpm right bundle branch block, pvc   Other past medical history reviewed Vestibular problem 08/22/2018 hospital Carotid u/s showing B/l Mild soft and hard plaque in the carotid bulb and proximal ICA Did vestibular PT  Severe reaction left arm, After PNA short  Had near syncope Has picture of left arm, swollen, erythematous shoulder to wrist Started to imprve in one week, Month to get better,    unable to complete the hormone therapy, last round of treatment was January 2015. He was unable to receive the radioactive seeds as he had a previous TURP   Echo 08/2018 - Left ventricle: The cavity size was normal. There was moderate   concentric hypertrophy. Systolic function was normal. The    estimated ejection fraction was in the range of 55% to 60%. Wall   motion was normal; there were no regional wall motion   abnormalities. Doppler parameters are consistent with abnormal   left ventricular relaxation (grade 1 diastolic dysfunction). - Aortic valve: There was trivial regurgitation. - Mitral valve: Calcified annulus. There was mild regurgitation.    PMH:   has a past medical history of Cataracts, bilateral, Depression, Elevated PSA, H/O seasonal allergies, History of chickenpox, Hypertension, Prostate cancer (Stuart Smith (2013), and Torticollis.  PSH:    Past Surgical History:  [place]ality [place]   [place]    Current Outpatient Medications  Medication Sig Dispense Refill   aspirin 81 MG tablet Take 81 mg by mouth daily.     cetirizine (ZYRTEC) 10 MG tablet Take 10 mg by mouth daily.     clonazePAM (KLONOPIN) 0.5 MG tablet Take 0.5 mg by mouth 3 (three) times daily.     DENTAGEL 1.1 % GEL dental gel Place onto teeth at bedtime.     ketoconazole (NIZORAL) 2 % cream Apply to feet at night 60 g 2   latanoprost (XALATAN) 0.005 % ophthalmic solution INSTILL 1 DROP INTO EACH EYE AT BEDTIME     Leuprolide Acetate, 6 Month, (LUPRON) 45 MG injection Inject 45 mg into the muscle every 6 (six) months.     lisinopril (PRINIVIL,ZESTRIL) 10 MG tablet Take 10  mg by mouth daily.     Magnesium 400 MG TABS Take 400 mg by mouth daily.     Multiple Vitamin (MULTIVITAMIN) capsule Take 1 capsule by mouth daily.     Travoprost, BAK Free, (TRAVATAN) 0.004 % SOLN ophthalmic solution INSTILL 1 DROP INTO EACH EYE AT BEDTIME     traZODone (DESYREL) 50 MG tablet Take 50 mg by mouth at bedtime.     triamcinolone (KENALOG) 0.1 % Apply to rash on legs daily as needed 60 g 2   vitamin C (ASCORBIC ACID) 500 MG tablet Take 500 mg by mouth daily.      No current facility-administered medications for this visit.     Allergies:   Keflex  [cephalexin]   Social History:  The patient  reports that he has never smoked. He has never used smokeless tobacco. He reports current alcohol use of about 1.0 standard drink per week. He reports that he does not use drugs.   Family History:   Family history is unknown by patient.    Review of Systems: Review of Systems  Constitutional: Negative.   HENT: Negative.    Respiratory: Negative.    Cardiovascular: Negative.   Gastrointestinal: Negative.   Musculoskeletal: Negative.        Unsteady gait  Neurological: Negative.   Psychiatric/Behavioral: Negative.    All other systems reviewed and are negative.  PHYSICAL EXAM: VS:  BP (!) 164/68 (BP Location: Left Arm, Patient Position: Sitting, Cuff Size: Normal)   Pulse 85   Ht '5\' 9"'$  (1.753 m)   Wt 162 lb 6 oz (73.7 kg)   SpO2 98%   BMI 23.98 kg/m  , BMI Body mass index is 23.98 kg/m. Constitutional:  oriented to person, place, and time. No distress.  HENT:  Head: Grossly normal Eyes:  no discharge. No scleral icterus.  Neck: No JVD, no carotid bruits  Cardiovascular: Regular rate and rhythm, no murmurs appreciated Pulmonary/Chest: Clear to auscultation bilaterally, no wheezes or rails Abdominal: Soft.  no distension.  no tenderness.  Musculoskeletal: Normal range of motion Neurological:  normal muscle tone. Coordination normal. No atrophy Skin: Skin warm and dry Psychiatric: normal affect, pleasant   Recent Labs: No results found for requested labs within last 8760 hours.    Lipid Panel No results found for: CHOL, HDL, LDLCALC, TRIG    Wt Readings from Last 3 Encounters:  07/16/21 162 lb 6 oz (73.7 kg)  07/02/20 174 lb 12 oz (79.3 kg)  06/10/19 184 lb 12 oz (83.8 kg)      ASSESSMENT AND PLAN:  Atherosclerosis of abdominal aorta (HCC) Previously seen on bone scan, Previously declined statin   Neck pain Receiving Botox every 3 months at [facility] Dose of Botox increased on recent injections, now having severe  neck pain, difficulty swallowing  PVC No symptoms Noted on EKGs today Not on beta-blocker  Carotid stenosis Mild disease seen on ultrasound Not on statin  Gait instability Recommended regular walking program, he is staying active  HTN: Elevated in setting of pain Will monitor form home    Total encounter time more than 25 minutes  Greater than 50% was spent in counseling and coordination of care with the patient    No orders of the defined types were placed in this encounter.    Signed, Esmond Plants, M.D., Ph.D. 07/16/2021  [place], [place]

## 2021-10-24 ENCOUNTER — Other Ambulatory Visit: Payer: Self-pay

## 2021-10-24 ENCOUNTER — Ambulatory Visit (INDEPENDENT_AMBULATORY_CARE_PROVIDER_SITE_OTHER): Payer: Medicare Other | Admitting: Dermatology

## 2021-10-24 DIAGNOSIS — L578 Other skin changes due to chronic exposure to nonionizing radiation: Secondary | ICD-10-CM

## 2021-10-24 DIAGNOSIS — D229 Melanocytic nevi, unspecified: Secondary | ICD-10-CM

## 2021-10-24 DIAGNOSIS — Z1283 Encounter for screening for malignant neoplasm of skin: Secondary | ICD-10-CM

## 2021-10-24 DIAGNOSIS — L570 Actinic keratosis: Secondary | ICD-10-CM

## 2021-10-24 DIAGNOSIS — L814 Other melanin hyperpigmentation: Secondary | ICD-10-CM

## 2021-10-24 DIAGNOSIS — D1800 Hemangioma unspecified site: Secondary | ICD-10-CM | POA: Diagnosis not present

## 2021-10-24 DIAGNOSIS — L821 Other seborrheic keratosis: Secondary | ICD-10-CM

## 2021-10-24 DIAGNOSIS — I6529 Occlusion and stenosis of unspecified carotid artery: Secondary | ICD-10-CM

## 2021-10-24 NOTE — Progress Notes (Signed)
Follow-Up Visit   Subjective  Stuart Smith is a 87 y.o. male who presents for the following: Follow-up (Patient here today for tbse. Patient reports a spot at top of left ear he noticed 4 - 5 months ago. The patient has spots, moles and lesions to be evaluated, some may be new or changing and the patient has concerns that these could be cancer./). Patient here for full body skin exam and skin cancer screening.  The following portions of the chart were reviewed this encounter and updated as appropriate:  Tobacco  Allergies  Meds  Problems  Med Hx  Surg Hx  Fam Hx     Review of Systems: No other skin or systemic complaints except as noted in HPI or Assessment and Plan.  Objective  Well appearing patient in no apparent distress; mood and affect are within normal limits.  A full examination was performed including scalp, head, eyes, ears, nose, lips, neck, chest, axillae, abdomen, back, buttocks, bilateral upper extremities, bilateral lower extremities, hands, feet, fingers, toes, fingernails, and toenails. All findings within normal limits unless otherwise noted below.  Left Ear x 1 Erythematous thin papules/macules with gritty scale.    Assessment & Plan  Actinic keratosis Left Ear x 1 Actinic keratoses are precancerous spots that appear secondary to cumulative UV radiation exposure/sun exposure over time. They are chronic with expected duration over 1 year. A portion of actinic keratoses will progress to squamous cell carcinoma of the skin. It is not possible to reliably predict which spots will progress to skin cancer and so treatment is recommended to prevent development of skin cancer.  Recommend daily broad spectrum sunscreen SPF 30+ to sun-exposed areas, reapply every 2 hours as needed.  Recommend staying in the shade or wearing long sleeves, sun glasses (UVA+UVB protection) and wide brim hats (4-inch brim around the entire circumference of the hat). Call for new or  changing lesions.  Destruction of lesion - Left Ear x 1 Complexity: simple   Destruction method: cryotherapy   Informed consent: discussed and consent obtained   Timeout:  patient name, date of birth, surgical site, and procedure verified Lesion destroyed using liquid nitrogen: Yes   Region frozen until ice ball extended beyond lesion: Yes   Outcome: patient tolerated procedure well with no complications   Post-procedure details: wound care instructions given   Additional details:  Prior to procedure, discussed risks of blister formation, small wound, skin dyspigmentation, or rare scar following cryotherapy. Recommend Vaseline ointment to treated areas while healing.  Skin cancer screening  Lentigines - Scattered tan macules - Due to sun exposure - Benign-appearing, observe - Recommend daily broad spectrum sunscreen SPF 30+ to sun-exposed areas, reapply every 2 hours as needed. - Call for any changes  Seborrheic Keratoses - Stuck-on, waxy, tan-brown papules and/or plaques  - Benign-appearing - Discussed benign etiology and prognosis. - Observe - Call for any changes  Melanocytic Nevi - Tan-brown and/or pink-flesh-colored symmetric macules and papules - Benign appearing on exam today - Observation - Call clinic for new or changing moles - Recommend daily use of broad spectrum spf 30+ sunscreen to sun-exposed areas.   Hemangiomas - Red papules - Discussed benign nature - Observe - Call for any changes  Actinic Damage - Chronic condition, secondary to cumulative UV/sun exposure - diffuse scaly erythematous macules with underlying dyspigmentation - Recommend daily broad spectrum sunscreen SPF 30+ to sun-exposed areas, reapply every 2 hours as needed.  - Staying in the shade or wearing  long sleeves, sun glasses (UVA+UVB protection) and wide brim hats (4-inch brim around the entire circumference of the hat) are also recommended for sun protection.  - Call for new or changing  lesions.  Skin cancer screening performed today.  Return for 1 year tbse. IRuthell Rummage, CMA, am acting as scribe for Sarina Ser, MD. Documentation: I have reviewed the above documentation for accuracy and completeness, and I agree with the above.  Sarina Ser, MD

## 2021-10-24 NOTE — Patient Instructions (Addendum)
Melanoma ABCDEs ? ?Melanoma is the most dangerous type of skin cancer, and is the leading cause of death from skin disease.  You are more likely to develop melanoma if you: ?Have light-colored skin, light-colored eyes, or red or blond hair ?Spend a lot of time in the sun ?Tan regularly, either outdoors or in a tanning bed ?Have had blistering sunburns, especially during childhood ?Have a close family member who has had a melanoma ?Have atypical moles or large birthmarks ? ?Early detection of melanoma is key since treatment is typically straightforward and cure rates are extremely high if we catch it early.  ? ?The first sign of melanoma is often a change in a mole or a new dark spot.  The ABCDE system is a way of remembering the signs of melanoma. ? ?A for asymmetry:  The two halves do not match. ?B for border:  The edges of the growth are irregular. ?C for color:  A mixture of colors are present instead of an even brown color. ?D for diameter:  Melanomas are usually (but not always) greater than 6mm - the size of a pencil eraser. ?E for evolution:  The spot keeps changing in size, shape, and color. ? ?Please check your skin once per month between visits. You can use a small mirror in front and a large mirror behind you to keep an eye on the back side or your body.  ? ?If you see any new or changing lesions before your next follow-up, please call to schedule a visit. ? ?Please continue daily skin protection including broad spectrum sunscreen SPF 30+ to sun-exposed areas, reapplying every 2 hours as needed when you're outdoors.  ? ?Staying in the shade or wearing long sleeves, sun glasses (UVA+UVB protection) and wide brim hats (4-inch brim around the entire circumference of the hat) are also recommended for sun protection.   ? ? ?If You Need Anything After Your Visit ? ?If you have any questions or concerns for your doctor, please call our main line at [phone] and press option 4 to reach your doctor's medical  assistant. If no one answers, please leave a voicemail as directed and we will return your call as soon as possible. Messages left after 4 pm will be answered the following business day.  ? ?You may also send us a message via MyChart. We typically respond to MyChart messages within 1-2 business days. ? ?For prescription refills, please ask your pharmacy to contact our office. Our fax number is [phone]. ? ?If you have an urgent issue when the clinic is closed that cannot wait until the next business day, you can page your doctor at the number below.   ? ?Please note that while we do our best to be available for urgent issues outside of office hours, we are not available 24/7.  ? ?If you have an urgent issue and are unable to reach us, you may choose to seek medical care at your doctor's office, retail clinic, urgent care center, or emergency room. ? ?If you have a medical emergency, please immediately call 911 or go to the emergency department. ? ?Pager Numbers ? ?- Dr. Kowalski: [phone] ? ?- Dr. Moye: [phone] ? ?- Dr. Stewart: [phone] ? ?In the event of inclement weather, please call our main line at [phone] for an update on the status of any delays or closures. ? ?Dermatology Medication Tips: ?Please keep the boxes that topical medications come in in order to help keep track of the instructions   about where and how to use these. Pharmacies typically print the medication instructions only on the boxes and not directly on the medication tubes.  ? ?If your medication is too expensive, please contact our office at [phone] option 4 or send us a message through MyChart.  ? ?We are unable to tell what your co-pay for medications will be in advance as this is different depending on your insurance coverage. However, we may be able to find a substitute medication at lower cost or fill out paperwork to get insurance to cover a needed medication.  ? ?If a prior authorization is required to get your  medication covered by your insurance company, please allow us 1-2 business days to complete this process. ? ?Drug prices often vary depending on where the prescription is filled and some pharmacies may offer cheaper prices. ? ?The website www.goodrx.com contains coupons for medications through different pharmacies. The prices here do not account for what the cost may be with help from insurance (it may be cheaper with your insurance), but the website can give you the price if you did not use any insurance.  ?- You can print the associated coupon and take it with your prescription to the pharmacy.  ?- You may also stop by our office during regular business hours and pick up a GoodRx coupon card.  ?- If you need your prescription sent electronically to a different pharmacy, notify our office through [facility] MyChart or by phone at [phone] option 4. ? ? ? ? ?Si Usted Necesita Algo Despu?s de Su Visita ? ?Tambi?n puede enviarnos un mensaje a trav?s de MyChart. Por lo general respondemos a los mensajes de MyChart en el transcurso de 1 a 2 d?as h?biles. ? ?Para renovar recetas, por favor pida a su farmacia que se ponga en contacto con nuestra oficina. Nuestro n?mero de fax es el [phone]. ? ?Si tiene un asunto urgente cuando la cl?nica est? cerrada y que no puede esperar hasta el siguiente d?a h?bil, puede llamar/localizar a su doctor(a) al n?mero que aparece a continuaci?n.  ? ?Por favor, tenga en cuenta que aunque hacemos todo lo posible para estar disponibles para asuntos urgentes fuera del horario de oficina, no estamos disponibles las 24 horas del d?a, los 7 d?as de la semana.  ? ?Si tiene un problema urgente y no puede comunicarse con nosotros, puede optar por buscar atenci?n m?dica  en el consultorio de su doctor(a), en una cl?nica privada, en un centro de atenci?n urgente o en una sala de emergencias. ? ?Si tiene una emergencia m?dica, por favor llame inmediatamente al 911 o vaya a la sala de  emergencias. ? ?N?meros de b?per ? ?- Dr. Kowalski: [phone] ? ?- Dra. Moye: [phone] ? ?- Dra. Stewart: [phone] ? ?En caso de inclemencias del tiempo, por favor llame a nuestra l?nea principal al [phone] para una actualizaci?n sobre el estado de cualquier retraso o cierre. ? ?Consejos para la medicaci?n en dermatolog?a: ?Por favor, guarde las cajas en las que vienen los medicamentos de uso t?pico para ayudarle a seguir las instrucciones sobre d?nde y c?mo usarlos. Las farmacias generalmente imprimen las instrucciones del medicamento s?lo en las cajas y no directamente en los tubos del medicamento.  ? ?Si su medicamento es muy caro, por favor, p?ngase en contacto con nuestra oficina llamando al [phone] y presione la opci?n 4 o env?enos un mensaje a trav?s de MyChart.  ? ?No podemos decirle cu?l ser? su copago por los medicamentos por adelantado ya que   esto es diferente dependiendo de la cobertura de su seguro. Sin embargo, es posible que podamos encontrar un medicamento sustituto a menor costo o llenar un formulario para que el seguro cubra el medicamento que se considera necesario.  ? ?Si se requiere una autorizaci?n previa para que su compa??a de seguros cubra su medicamento, por favor perm?tanos de 1 a 2 d?as h?biles para completar este proceso. ? ?Los precios de los medicamentos var?an con frecuencia dependiendo del lugar de d?nde se surte la receta y alguna farmacias pueden ofrecer precios m?s baratos. ? ?El sitio web www.goodrx.com tiene cupones para medicamentos de diferentes farmacias. Los precios aqu? no tienen en cuenta lo que podr?a costar con la ayuda del seguro (puede ser m?s barato con su seguro), pero el sitio web puede darle el precio si no utiliz? ning?n seguro.  ?- Puede imprimir el cup?n correspondiente y llevarlo con su receta a la farmacia.  ?- Tambi?n puede pasar por nuestra oficina durante el horario de atenci?n regular y recoger una tarjeta de cupones de GoodRx.  ?- Si  necesita que su receta se env?e electr?nicamente a una farmacia diferente, informe a nuestra oficina a trav?s de MyChart de [facility] o por tel?fono llamando al [phone] y presione la opci?n 4. ? ?

## 2021-11-02 ENCOUNTER — Encounter: Payer: Self-pay | Admitting: Dermatology

## 2021-11-25 ENCOUNTER — Encounter: Payer: Self-pay | Admitting: *Deleted

## 2021-11-29 ENCOUNTER — Inpatient Hospital Stay: Payer: Medicare Other

## 2021-11-29 ENCOUNTER — Inpatient Hospital Stay: Payer: Medicare Other | Attending: Oncology | Admitting: Oncology

## 2021-11-29 ENCOUNTER — Other Ambulatory Visit: Payer: Self-pay

## 2021-11-29 ENCOUNTER — Encounter: Payer: Self-pay | Admitting: Oncology

## 2021-11-29 VITALS — BP 146/89 | HR 88 | Temp 97.1°F | Wt 165.0 lb

## 2021-11-29 DIAGNOSIS — C61 Malignant neoplasm of prostate: Secondary | ICD-10-CM

## 2021-11-29 DIAGNOSIS — Z79899 Other long term (current) drug therapy: Secondary | ICD-10-CM | POA: Insufficient documentation

## 2021-11-29 DIAGNOSIS — D649 Anemia, unspecified: Secondary | ICD-10-CM | POA: Diagnosis present

## 2021-11-29 DIAGNOSIS — Z8042 Family history of malignant neoplasm of prostate: Secondary | ICD-10-CM | POA: Insufficient documentation

## 2021-11-29 DIAGNOSIS — R6889 Other general symptoms and signs: Secondary | ICD-10-CM | POA: Diagnosis not present

## 2021-11-29 DIAGNOSIS — R5383 Other fatigue: Secondary | ICD-10-CM | POA: Insufficient documentation

## 2021-11-29 LAB — CBC WITH DIFFERENTIAL/PLATELET
Abs Immature Granulocytes: 0.06 10*3/uL (ref 0.00–0.07)
Basophils Absolute: 0 10*3/uL (ref 0.0–0.1)
Basophils Relative: 1 %
Eosinophils Absolute: 0.1 10*3/uL (ref 0.0–0.5)
Eosinophils Relative: 1 %
HCT: 38.8 % — ABNORMAL LOW (ref 39.0–52.0)
Hemoglobin: 13.3 g/dL (ref 13.0–17.0)
Immature Granulocytes: 1 %
Lymphocytes Relative: 20 %
Lymphs Abs: 1.3 10*3/uL (ref 0.7–4.0)
MCH: 33.9 pg (ref 26.0–34.0)
MCHC: 34.3 g/dL (ref 30.0–36.0)
MCV: 99 fL (ref 80.0–100.0)
Monocytes Absolute: 0.5 10*3/uL (ref 0.1–1.0)
Monocytes Relative: 7 %
Neutro Abs: 4.3 10*3/uL (ref 1.7–7.7)
Neutrophils Relative %: 70 %
Platelets: 216 10*3/uL (ref 150–400)
RBC: 3.92 MIL/uL — ABNORMAL LOW (ref 4.22–5.81)
RDW: 12.2 % (ref 11.5–15.5)
WBC: 6.2 10*3/uL (ref 4.0–10.5)
nRBC: 0 % (ref 0.0–0.2)

## 2021-11-29 LAB — COMPREHENSIVE METABOLIC PANEL
ALT: 17 U/L (ref 0–44)
AST: 14 U/L — ABNORMAL LOW (ref 15–41)
Albumin: 4.5 g/dL (ref 3.5–5.0)
Alkaline Phosphatase: 63 U/L (ref 38–126)
Anion gap: 6 (ref 5–15)
BUN: 20 mg/dL (ref 8–23)
CO2: 27 mmol/L (ref 22–32)
Calcium: 9.3 mg/dL (ref 8.9–10.3)
Chloride: 94 mmol/L — ABNORMAL LOW (ref 98–111)
Creatinine, Ser: 0.68 mg/dL (ref 0.61–1.24)
GFR, Estimated: >60 mL/min (ref >60)
Glucose, Bld: 113 mg/dL — ABNORMAL HIGH (ref 70–99)
Potassium: 4.5 mmol/L (ref 3.5–5.1)
Sodium: 127 mmol/L — ABNORMAL LOW (ref 135–145)
Total Bilirubin: 0.3 mg/dL (ref 0.3–1.2)
Total Protein: 7.4 g/dL (ref 6.5–8.1)

## 2021-11-29 LAB — FERRITIN: Ferritin: 192 ng/mL (ref 24–336)

## 2021-11-29 LAB — RETIC PANEL
Immature Retic Fract: 4.2 % (ref 2.3–15.9)
RBC.: 3.87 MIL/uL — ABNORMAL LOW (ref 4.22–5.81)
Retic Count, Absolute: 65.4 10*3/uL (ref 19.0–186.0)
Retic Ct Pct: 1.7 % (ref 0.4–3.1)
Reticulocyte Hemoglobin: 38 pg (ref >27.9)

## 2021-11-29 LAB — VITAMIN B12: Vitamin B-12: 1071 pg/mL — ABNORMAL HIGH (ref 180–914)

## 2021-11-29 LAB — LACTATE DEHYDROGENASE: LDH: 122 U/L (ref 98–192)

## 2021-11-29 LAB — IRON AND TIBC
Iron: 128 ug/dL (ref 45–182)
Saturation Ratios: 41 % — ABNORMAL HIGH (ref 17.9–39.5)
TIBC: 316 ug/dL (ref 250–450)
UIBC: 188 ug/dL

## 2021-11-29 LAB — TECHNOLOGIST SMEAR REVIEW
Plt Morphology: NORMAL
RBC MORPHOLOGY: NORMAL
WBC MORPHOLOGY: NORMAL

## 2021-11-29 LAB — FOLATE: Folate: 38 ng/mL (ref >5.9)

## 2021-11-29 NOTE — Progress Notes (Signed)
Hematology/Oncology Consult note [facility] Telephone:([phone] [phone]   Patient Care Team: Maryland Pink, MD as PCP - General (Family Medicine) Minna Merritts, MD as Consulting Physician (Cardiology) Earlie Server, MD as Consulting Physician (Hematology and Oncology)  REFERRING PROVIDER: Maryland Pink, MD  CHIEF COMPLAINTS/REASON FOR VISIT:  Evaluation of anemia  HISTORY OF PRESENTING ILLNESS:  Stuart Smith is a  88 y.o.  male with PMH listed below who was referred to me for evaluation of anemia Reviewed patient's recent labs that was done.  11/01/21 Labs revealed anemia with hemoglobin of 12.2, mcv 98, wbc 4.4,  Reviewed his previous lab records, anemia is chronic, since at least 2016  He also reports a history of prostate cancer, T1a, he previously follows up with Urologist at [facility] and has received leupron,22.25m,  last received on 11/12/20. He reports having " some issue" after injection and did not follow up.  He appears to have transportation barrier in recently years during driving ability. He is interested in establish care with urology locally.  [facility]   He reports getting botox in neck for muscle spasm, [facility]   Review of Systems  Constitutional:  Positive for fatigue. Negative for appetite change, chills, fever and unexpected weight change.  HENT:   Negative for hearing loss and voice change.   Eyes:  Negative for eye problems and icterus.  Respiratory:  Negative for chest tightness, cough and shortness of breath.   Cardiovascular:  Negative for chest pain and leg swelling.  Gastrointestinal:  Negative for abdominal distention and abdominal pain.  Endocrine: Negative for hot flashes.  Genitourinary:  Negative for difficulty urinating, dysuria and frequency.   Musculoskeletal:  Negative for arthralgias.  Skin:  Negative for  itching and rash.  Neurological:  Negative for light-headedness and numbness.       Balancing issue.   Hematological:  Negative for adenopathy. Does not bruise/bleed easily.  Psychiatric/Behavioral:  Negative for confusion.     MEDICAL HISTORY:  Past Medical History:  Diagnosis Date   Anemia    Cataracts, bilateral    Cervicalgia    Depression    Elevated PSA    H/O seasonal allergies    History of chickenpox    Hypertension    Insomnia, unspecified type    Major depressive disorder    PAD (peripheral artery disease) (HCC)    [place]   Surgery    Torticollis     SURGICAL HISTORY: Past Surgical History:  Procedure Laterality Date   BACK SURGERY  1995   GALLBLADDER SURGERY     PROSTATE SURGERY  2013    SOCIAL HISTORY: Social History   Socioeconomic History   Marital status: Married    Spouse name: Not on file   Number of children: Not on file   Years of education: Not on file   Highest education level: Not on file  Occupational History   Not on file  Tobacco Use   Smoking status: Never    Passive exposure: Never   Smokeless tobacco: Never  Vaping Use   Vaping Use: Never used  Substance and Sexual Activity   Alcohol use: Yes    Alcohol/week: 1.0 standard drink    Types: 1 Glasses of wine per week    Comment: 1x glass of red wine daily per pt   Drug use: No  Sexual activity: Not on file  Other Topics Concern   Not on file  Social History Narrative   Independent at baseline.  Lives with wife at [facility] independent facility   Social Determinants of Health   Financial Resource Strain: Not on file  Food Insecurity: Not on file  Transportation Needs: Not on file  Physical Activity: Not on file  Stress: Not on file  Social Connections: Not on file  Intimate Partner Violence: Not on file    FAMILY HISTORY: Family History  Problem Relation Age of Onset   Hypertension Mother    Prostate cancer Brother    Diabetes Mellitus II  Brother    Hypertension Brother     ALLERGIES:  is allergic to keflex [cephalexin].  MEDICATIONS:  Current Outpatient Medications  Medication Sig Dispense Refill   aspirin 81 MG tablet Take 81 mg by mouth daily.     cetirizine (ZYRTEC) 10 MG tablet Take 10 mg by mouth daily.     clonazePAM (KLONOPIN) 0.5 MG tablet Take 0.5 mg by mouth 3 (three) times daily.     DENTAGEL 1.1 % GEL dental gel Place onto teeth at bedtime.     lisinopril (ZESTRIL) 10 MG tablet Take 1 tablet (10 mg total) by mouth daily. 90 tablet 3   Magnesium 400 MG TABS Take 400 mg by mouth daily.     Multiple Vitamin (MULTIVITAMIN) capsule Take 1 capsule by mouth daily.     Travoprost, BAK Free, (TRAVATAN) 0.004 % SOLN ophthalmic solution INSTILL 1 DROP INTO EACH EYE AT BEDTIME     traZODone (DESYREL) 50 MG tablet Take 50 mg by mouth at bedtime.     vitamin C (ASCORBIC ACID) 500 MG tablet Take 500 mg by mouth daily.      ketoconazole (NIZORAL) 2 % cream Apply to feet at night 60 g 2   latanoprost (XALATAN) 0.005 % ophthalmic solution INSTILL 1 DROP INTO EACH EYE AT BEDTIME     Leuprolide Acetate, 6 Month, (LUPRON) 45 MG injection Inject 45 mg into the muscle every 6 (six) months.     triamcinolone (KENALOG) 0.1 % Apply to rash on legs daily as needed 60 g 2   No current facility-administered medications for this visit.     PHYSICAL EXAMINATION: ECOG PERFORMANCE STATUS: 1 - Symptomatic but completely ambulatory Vitals:   11/29/21 1227  BP: (!) 146/89  Pulse: 88  Temp: (!) 97.1 F (36.2 C)   Filed Weights   11/29/21 1227  Weight: 165 lb (74.8 kg)    Physical Exam Constitutional:      General: He is not in acute distress. HENT:     Head: Normocephalic and atraumatic.  Eyes:     General: No scleral icterus. Cardiovascular:     Rate and Rhythm: Normal rate and regular rhythm.     Heart sounds: Normal heart sounds.  Pulmonary:     Effort: Pulmonary effort is normal. No respiratory distress.     Breath  sounds: No wheezing.  Abdominal:     General: Bowel sounds are normal. There is no distension.     Palpations: Abdomen is soft.  Musculoskeletal:        General: No deformity. Normal range of motion.     Cervical back: Normal range of motion and neck supple.     Comments: Bilateral lower extremity edema. 1+  Skin:    General: Skin is warm and dry.     Findings: No erythema or rash.  Neurological:  Mental Status: He is alert. Mental status is at baseline.     Cranial Nerves: No cranial nerve deficit.     Coordination: Coordination normal.  Psychiatric:        Mood and Affect: Mood normal.     LABORATORY DATA:  I have reviewed the data as listed Lab Results  Component Value Date   WBC 6.2 11/29/2021   HGB 13.3 11/29/2021   HCT 38.8 (L) 11/29/2021   MCV 99.0 11/29/2021   PLT 216 11/29/2021   Recent Labs    11/29/21 1317  NA 127*  K 4.5  CL 94*  CO2 27  GLUCOSE 113*  BUN 20  CREATININE 0.68  CALCIUM 9.3  GFRNONAA >60  PROT 7.4  ALBUMIN 4.5  AST 14*  ALT 17  ALKPHOS 63  BILITOT 0.3   Iron/TIBC/Ferritin/ %Sat    Component Value Date/Time   IRON 128 11/29/2021 1317   TIBC 316 11/29/2021 1317   FERRITIN 192 11/29/2021 1317   IRONPCTSAT 41 (H) 11/29/2021 1317        ASSESSMENT & PLAN:  1. Normocytic anemia   2. Prostate cancer ([place])   3. Other general symptoms and signs     Anemia:  Labs are reviewed and discussed with patient. Very mild, I recommend anemia work up.  Check cbc, cmp, SPEP, light chain ratio, iron tibc ferritin, B12, folate, smear.   # History of prostate caner, previously on intermittent ADT, off treatment currently. Previous urology record Is not fully available to me at the time of dictation.  He would like to follow up with local urology. Will refer. I will defer to urology for management of his prostate cancer. Happy to help if he needs systemic treatments.   Orders Placed This Encounter  Procedures   Ferritin    Standing  Status:   Future    Number of Occurrences:   1    Standing Expiration Date:   05/29/2022   Folate    Standing Status:   Future    Number of Occurrences:   1    Standing Expiration Date:   11/29/2022   Vitamin B12    Standing Status:   Future    Number of Occurrences:   1    Standing Expiration Date:   11/29/2022   Iron and TIBC    Standing Status:   Future    Number of Occurrences:   1    Standing Expiration Date:   11/29/2022   Technologist smear review    Standing Status:   Future    Number of Occurrences:   1    Standing Expiration Date:   11/29/2022   Comprehensive metabolic panel    Standing Status:   Future    Number of Occurrences:   1    Standing Expiration Date:   11/29/2022   CBC with Differential/Platelet    Standing Status:   Future    Number of Occurrences:   1    Standing Expiration Date:   11/29/2022   Retic Panel    Standing Status:   Future    Number of Occurrences:   1    Standing Expiration Date:   11/29/2022   Multiple Myeloma Panel (SPEP&IFE w/QIG)    Standing Status:   Future    Number of Occurrences:   1    Standing Expiration Date:   11/29/2022   Kappa/lambda light chains    Standing Status:   Future    Number of Occurrences:  1    Standing Expiration Date:   11/29/2022   Lactate dehydrogenase    Standing Status:   Future    Number of Occurrences:   1    Standing Expiration Date:   11/29/2022   Ambulatory referral to Urology    Referral Priority:   Routine    Referral Type:   Consultation    Referral Reason:   Specialty Services Required    Referred to Provider:   Billey Co, MD    Requested Specialty:   Urology    Number of Visits Requested:   1    All questions were answered. The patient knows to call the clinic with any problems questions or concerns. Leron Croak, MD  Return of visit:  3- 4 weeks to review results.  Thank you for this kind referral and the opportunity to participate in the care of this patient. A copy of today's  note is routed to referring provider  Earlie Server, MD, PhD 11/29/2021

## 2021-12-02 LAB — KAPPA/LAMBDA LIGHT CHAINS
Kappa free light chain: 26.7 mg/L — ABNORMAL HIGH (ref 3.3–19.4)
Kappa, lambda light chain ratio: 1.63 (ref 0.26–1.65)
Lambda free light chains: 16.4 mg/L (ref 5.7–26.3)

## 2021-12-04 ENCOUNTER — Telehealth: Payer: Self-pay

## 2021-12-04 NOTE — Telephone Encounter (Signed)
Urology referral has been ordered for Dr. Diamantina Providence. Faxed encounter, insurance card and demographics to his office as well.

## 2021-12-05 LAB — MULTIPLE MYELOMA PANEL, SERUM
Albumin SerPl Elph-Mcnc: 3.8 g/dL (ref 2.9–4.4)
Albumin/Glob SerPl: 1.4 (ref 0.7–1.7)
Alpha 1: 0.3 g/dL (ref 0.0–0.4)
Alpha2 Glob SerPl Elph-Mcnc: 0.8 g/dL (ref 0.4–1.0)
B-Globulin SerPl Elph-Mcnc: 1 g/dL (ref 0.7–1.3)
Gamma Glob SerPl Elph-Mcnc: 0.8 g/dL (ref 0.4–1.8)
Globulin, Total: 2.9 g/dL (ref 2.2–3.9)
IgA: 282 mg/dL (ref 61–437)
IgG (Immunoglobin G), Serum: 781 mg/dL (ref 603–1613)
IgM (Immunoglobulin M), Srm: 69 mg/dL (ref 15–143)
Total Protein ELP: 6.7 g/dL (ref 6.0–8.5)

## 2021-12-27 ENCOUNTER — Other Ambulatory Visit: Payer: Self-pay

## 2021-12-27 ENCOUNTER — Encounter: Payer: Self-pay | Admitting: Oncology

## 2021-12-27 ENCOUNTER — Inpatient Hospital Stay: Payer: Medicare Other | Attending: Oncology | Admitting: Oncology

## 2021-12-27 VITALS — BP 175/80 | HR 70 | Temp 96.5°F | Resp 16 | Ht 69.0 in | Wt 170.0 lb

## 2021-12-27 DIAGNOSIS — C61 Malignant neoplasm of prostate: Secondary | ICD-10-CM | POA: Insufficient documentation

## 2021-12-27 DIAGNOSIS — D649 Anemia, unspecified: Secondary | ICD-10-CM | POA: Diagnosis present

## 2021-12-27 NOTE — Progress Notes (Signed)
Hematology/Oncology Progress note [phone] [phone]      Patient Care Team: Maryland Pink, MD as PCP - General (Family Medicine) Minna Merritts, MD as Consulting Physician (Cardiology) Earlie Server, MD as Consulting Physician (Hematology and Oncology)  REFERRING PROVIDER: Maryland Pink, MD  CHIEF COMPLAINTS/REASON FOR VISIT:  anemia  HISTORY OF PRESENTING ILLNESS:  Stuart Smith is a  88 y.o.  male with PMH listed below who was referred to me for evaluation of anemia Reviewed patient's recent labs that was done.  11/01/21 Labs revealed anemia with hemoglobin of 12.2, mcv 98, wbc 4.4,  Reviewed his previous lab records, anemia is chronic, since at least 2016  He also reports a history of prostate cancer, T1a, he previously follows up with Urologist at [facility] and has received leupron,22.15m,  last received on 11/12/20. He reports having " some issue" after injection and did not follow up.  He appears to have transportation barrier in recently years during driving ability. He is interested in establish care with urology locally.  [facility]   He reports getting botox in neck for muscle spasm, [name] who has above history reviewed by me today presents for follow up visit for anemia.  Patient had blood work done during last visit and presents to discuss reports. + Chronic fatigue.  Neck muscle spasm.   Review of Systems  Constitutional:  Positive for fatigue. Negative for appetite change, chills, fever and unexpected weight change.  HENT:   Negative for hearing loss and voice change.   Eyes:  Negative for eye problems and icterus.  Respiratory:  Negative for chest tightness, cough and shortness of breath.   Cardiovascular:  Negative for chest pain and leg swelling.  Gastrointestinal:  Negative for abdominal  distention and abdominal pain.  Endocrine: Negative for hot flashes.  Genitourinary:  Negative for difficulty urinating, dysuria and frequency.   Musculoskeletal:  Positive for neck pain. Negative for arthralgias.  Skin:  Negative for itching and rash.  Neurological:  Negative for light-headedness and numbness.       Balancing issue.   Hematological:  Negative for adenopathy. Does not bruise/bleed easily.  Psychiatric/Behavioral:  Negative for confusion.     MEDICAL HISTORY:  Past Medical History:  Diagnosis Date   Anemia    Cataracts, bilateral    Cervicalgia    Depression    Elevated PSA    H/O seasonal allergies    History of chickenpox    Hypertension    Insomnia, unspecified type    Major depressive disorder    PAD (peripheral artery disease) (HCC)    [place]   [place]gery    Torticollis     SURGICAL HISTORY: Past Surgical History:  Procedure Laterality Date   BACK SURGERY  1995   GALLBLADDER SURGERY     PROSTATE SURGERY  2013    SOCIAL HISTORY: Social History   Socioeconomic History   Marital status: Married    Spouse name: Not on file   Number of children: Not on file   Years of education: Not on file   Highest education level: Not on file  Occupational History   Not on file  Tobacco Use   Smoking status: Never    Passive exposure: Never   Smokeless tobacco: Never  Vaping Use   Vaping  Use: Never used  Substance and Sexual Activity   Alcohol use: Yes    Alcohol/week: 1.0 standard drink    Types: 1 Glasses of wine per week    Comment: 1x glass of red wine daily per pt   Drug use: No   Sexual activity: Not on file  Other Topics Concern   Not on file  Social History Narrative   Independent at baseline.  Lives with wife at [facility] independent facility   Social Determinants of Health   Financial Resource Strain: Not on file  Food Insecurity: Not on file  Transportation Needs: Not on file  Physical Activity: Not on file   Stress: Not on file  Social Connections: Not on file  Intimate Partner Violence: Not on file    FAMILY HISTORY: Family History  Problem Relation Age of Onset   Hypertension Mother    Prostate cancer Brother    Diabetes Mellitus II Brother    Hypertension Brother     ALLERGIES:  is allergic to keflex [cephalexin].  MEDICATIONS:  Current Outpatient Medications  Medication Sig Dispense Refill   aspirin 81 MG tablet Take 81 mg by mouth daily.     cetirizine (ZYRTEC) 10 MG tablet Take 10 mg by mouth daily.     clonazePAM (KLONOPIN) 0.5 MG tablet Take 0.5 mg by mouth 3 (three) times daily.     DENTAGEL 1.1 % GEL dental gel Place onto teeth at bedtime.     Leuprolide Acetate, 6 Month, (LUPRON) 45 MG injection Inject 45 mg into the muscle every 6 (six) months.     lisinopril (ZESTRIL) 10 MG tablet Take 1 tablet (10 mg total) by mouth daily. 90 tablet 3   Magnesium 400 MG TABS Take 400 mg by mouth daily.     Multiple Vitamin (MULTIVITAMIN) capsule Take 1 capsule by mouth daily.     Travoprost, BAK Free, (TRAVATAN) 0.004 % SOLN ophthalmic solution INSTILL 1 DROP INTO EACH EYE AT BEDTIME     traZODone (DESYREL) 50 MG tablet Take 50 mg by mouth at bedtime.     vitamin C (ASCORBIC ACID) 500 MG tablet Take 500 mg by mouth daily.      calcium carbonate (OSCAL) 1500 (600 Ca) MG TABS tablet Take by mouth.     No current facility-administered medications for this visit.     PHYSICAL EXAMINATION: ECOG PERFORMANCE STATUS: 1 - Symptomatic but completely ambulatory Vitals:   12/27/21 1153  BP: (!) 175/80  Pulse: 70  Resp: 16  Temp: (!) 96.5 F (35.8 C)  SpO2: 100%   Filed Weights   12/27/21 1153  Weight: 170 lb (77.1 kg)    Physical Exam Constitutional:      General: He is not in acute distress. HENT:     Head: Normocephalic and atraumatic.  Eyes:     General: No scleral icterus. Cardiovascular:     Rate and Rhythm: Normal rate and regular rhythm.     Heart sounds: Normal  heart sounds.  Pulmonary:     Effort: Pulmonary effort is normal. No respiratory distress.     Breath sounds: No wheezing.  Abdominal:     General: Bowel sounds are normal. There is no distension.     Palpations: Abdomen is soft.  Musculoskeletal:        General: No deformity. Normal range of motion.     Cervical back: Normal range of motion and neck supple.     Comments: Bilateral lower extremity edema. 1+  Skin:  General: Skin is warm and dry.     Findings: No erythema or rash.  Neurological:     Mental Status: He is alert. Mental status is at baseline.     Cranial Nerves: No cranial nerve deficit.     Coordination: Coordination normal.  Psychiatric:        Mood and Affect: Mood normal.     LABORATORY DATA:  I have reviewed the data as listed Lab Results  Component Value Date   WBC 6.2 11/29/2021   HGB 13.3 11/29/2021   HCT 38.8 (L) 11/29/2021   MCV 99.0 11/29/2021   PLT 216 11/29/2021   Recent Labs    11/29/21 1317  NA 127*  K 4.5  CL 94*  CO2 27  GLUCOSE 113*  BUN 20  CREATININE 0.68  CALCIUM 9.3  GFRNONAA >60  PROT 7.4  ALBUMIN 4.5  AST 14*  ALT 17  ALKPHOS 63  BILITOT 0.3    Iron/TIBC/Ferritin/ %Sat    Component Value Date/Time   IRON 128 11/29/2021 1317   TIBC 316 11/29/2021 1317   FERRITIN 192 11/29/2021 1317   IRONPCTSAT 41 (H) 11/29/2021 1317         ASSESSMENT & PLAN:  1. Prostate cancer ([place])   2. Normocytic anemia     Anemia:  Labs reviewed reviewed and discussed with patient. Repeat CBC showed completely normal hemoglobin.  Adequate folate and vitamin B12, adequate iron panel. Multiple myeloma showed negative M protein.  Normal light chain ratio.  Normal LDH. We will hold off additional work-up.  # History of prostate caner, previously on intermittent ADT, off treatment currently. Previous urology record Is not fully available to me at the time of dictation.  I have referred patient to urology and he has not established  care yet. Patient's last PSA was 4.  I discussed with him about recommendation of surveillance imaging, resuming leupron and he is not interested. He will call our office if he changes his mind.    All questions were answered. The patient knows to call the clinic with any problems questions or concerns. Leron Croak, MD  Return of visit:  discharge   Earlie Server, MD, PhD 12/27/2021

## 2022-05-02 ENCOUNTER — Other Ambulatory Visit: Payer: Self-pay

## 2022-05-02 DIAGNOSIS — C61 Malignant neoplasm of prostate: Secondary | ICD-10-CM

## 2022-05-09 ENCOUNTER — Encounter: Payer: Self-pay | Admitting: *Deleted

## 2022-07-23 ENCOUNTER — Other Ambulatory Visit: Payer: Self-pay | Admitting: Cardiovascular Disease

## 2022-07-24 NOTE — Progress Notes (Signed)
Cardiology Office Note  Date:  07/25/2022   ID:  Stuart Smith, DOB 1927/08/02, MRN [id number]  PCP:  Maryland Pink, MD   Chief Complaint  Patient presents with   12 month follow up     "Doing well." Medications reviewed by the patient verbally.     HPI:  Stuart Smith is a 88 year old gentleman with a  long history of prostate issues, s/p TURP,  found to have prostate cancer, status post prostate resection,  Currently lives at [facility] [place]  Mild carotid and aortic atherosclerosis who presents for routine followup of his abdominal aortic atherosclerosis, HTN  Last seen in clinic by myself aug 2022 Had botox in neck, goes to [facility],  Aug 15th had botox Previously Choking when trying to eat Given celebrex 200 mg prn for pain Concerned about low sodium level on lab work, has started increasing his salt intake BP started running high, PMD added norvasc 2.5 daily Drinking V8 juice as well as adding salt seasoning to food  Sedentary, limited by neck issues, leg weakness, uses a cane Does not play golf anymore or go to the gym No falls  Denies significant shortness of breath or chest pain on exertion  Denies any tachycardia or palpitations  Labs reviewed Total chol, 162 LDL 75  EKG personally reviewed by myself on todays visit Shows normal sinus rhythm rate 75 bpm right bundle branch block  Other past medical history reviewed Vestibular problem 08/22/2018 hospital Carotid u/s showing B/l Mild soft and hard plaque in the carotid bulb and proximal ICA Did vestibular PT  Severe reaction left arm, After PNA short  Had near syncope Has picture of left arm, swollen, erythematous shoulder to wrist Started to imprve in one week, Month to get better,    unable to complete the hormone therapy, last round of treatment was January 2015. He was unable to receive the radioactive seeds as he had a previous TURP  Echo 08/2018 - Left ventricle: The cavity size was normal. There was  moderate   concentric hypertrophy. Systolic function was normal. The   estimated ejection fraction was in the range of 55% to 60%. Wall   motion was normal; there were no regional wall motion   abnormalities. Doppler parameters are consistent with abnormal   left ventricular relaxation (grade 1 diastolic dysfunction). - Aortic valve: There was trivial regurgitation. - Mitral valve: Calcified annulus. There was mild regurgitation.    PMH:   has a past medical history of Anemia, Cataracts, bilateral, Cervicalgia, Depression, Elevated PSA, H/O seasonal allergies, History of chickenpox, Hypertension, Insomnia, unspecified type, Major depressive disorder, PAD (peripheral artery disease) ([place]), Prostate cancer ([place]) (2013), and Torticollis.  PSH:    Past Surgical History:  Procedure Laterality Date   [place]  2013    Current Outpatient Medications  Medication Sig Dispense Refill   amLODipine (NORVASC) 2.5 MG tablet Take 2.5 mg by mouth every morning.     aspirin 81 MG tablet Take 81 mg by mouth daily.     calcium carbonate (OSCAL) 1500 (600 Ca) MG TABS tablet Take by mouth.     celecoxib (CELEBREX) 200 MG capsule 1 po in morning with food for neck pain     cetirizine (ZYRTEC) 10 MG tablet Take 10 mg by mouth daily.     clonazePAM (KLONOPIN) 0.5 MG tablet Take 0.5 mg by mouth 3 (three) times daily.     DENTAGEL 1.1 %  GEL dental gel Place onto teeth at bedtime.     lisinopril (ZESTRIL) 10 MG tablet Take 1 tablet by mouth once daily 30 tablet 0   Magnesium 400 MG TABS Take 400 mg by mouth daily.     Multiple Vitamin (MULTIVITAMIN) capsule Take 1 capsule by mouth daily.     Travoprost, BAK Free, (TRAVATAN) 0.004 % SOLN ophthalmic solution INSTILL 1 DROP INTO EACH EYE AT BEDTIME     traZODone (DESYREL) 50 MG tablet Take 50 mg by mouth at bedtime.     vitamin C (ASCORBIC ACID) 500 MG tablet Take 500 mg by mouth daily.      Leuprolide  Acetate, 6 Month, (LUPRON) 45 MG injection Inject 45 mg into the muscle every 6 (six) months. (Patient not taking: Reported on 07/25/2022)     No current facility-administered medications for this visit.     Allergies:   Keflex [cephalexin]   Social History:  The patient  reports that he has never smoked. He has never been exposed to tobacco smoke. He has never used smokeless tobacco. He reports current alcohol use of about 1.0 standard drink of alcohol per week. He reports that he does not use drugs.   Family History:   family history includes Diabetes Mellitus II in his brother; Hypertension in his brother and mother; Prostate cancer in his brother.    Review of Systems: Review of Systems  Constitutional: Negative.   HENT: Negative.    Respiratory: Negative.    Cardiovascular: Negative.   Gastrointestinal: Negative.   Musculoskeletal: Negative.        Unsteady gait  Neurological: Negative.   Psychiatric/Behavioral: Negative.    All other systems reviewed and are negative.   PHYSICAL EXAM: VS:  BP (!) 140/80 (BP Location: Left Arm, Patient Position: Sitting, Cuff Size: Normal)   Pulse 75   Ht '5\' 10"'$  (1.778 m)   Wt 164 lb 4 oz (74.5 kg)   SpO2 98%   BMI 23.57 kg/m  , BMI Body mass index is 23.57 kg/m. Constitutional:  oriented to person, place, and time. No distress.  HENT:  Head: Grossly normal Eyes:  no discharge. No scleral icterus.  Neck: No JVD, no carotid bruits  Cardiovascular: Regular rate and rhythm, no murmurs appreciated Pulmonary/Chest: Clear to auscultation bilaterally, no wheezes or rails Abdominal: Soft.  no distension.  no tenderness.  Musculoskeletal: Normal range of motion Neurological:  normal muscle tone. Coordination normal. No atrophy Skin: Skin warm and dry Psychiatric: normal affect, pleasant  Recent Labs: 11/29/2021: ALT 17; BUN 20; Creatinine, Ser 0.68; Hemoglobin 13.3; Platelets 216; Potassium 4.5; Sodium 127    Lipid Panel No results  found for: "CHOL", "HDL", "LDLCALC", "TRIG"    Wt Readings from Last 3 Encounters:  07/25/22 164 lb 4 oz (74.5 kg)  12/27/21 170 lb (77.1 kg)  11/29/21 165 lb (74.8 kg)      ASSESSMENT AND PLAN:  Atherosclerosis of abdominal aorta (HCC) Previously seen on bone scan, Previously declined statin   Neck pain Receiving Botox every 3 months at [facility] Dose of Botox increased on recent injections,  Neck pain managed with Celebrex  PVC No symptoms Noted on prior EKG Not on beta-blocker  Carotid stenosis Mild disease seen on ultrasound Not on statin  Gait instability Recommended regular walking program, he is staying active  HTN: Has increased his salt intake at home out of concern for hyponatremia which is chronic Recommend increase amlodipine up to 5 mg daily continue lisinopril 10  Hyponatremia Recommend he try to moderate his free water intake Low sodium is stable over the past 5 years    Total encounter time more than 30 minutes  Greater than 50% was spent in counseling and coordination of care with the patient    No orders of the defined types were placed in this encounter.    Signed, Esmond Plants, M.D., Ph.D. 07/25/2022  [place], [place]

## 2022-07-25 ENCOUNTER — Encounter: Payer: Self-pay | Admitting: Cardiovascular Disease

## 2022-07-25 ENCOUNTER — Ambulatory Visit: Payer: Medicare Other | Attending: Cardiovascular Disease | Admitting: Cardiovascular Disease

## 2022-07-25 VITALS — BP 140/80 | HR 75 | Ht 70.0 in | Wt 164.2 lb

## 2022-07-25 DIAGNOSIS — I10 Essential (primary) hypertension: Secondary | ICD-10-CM | POA: Diagnosis not present

## 2022-07-25 DIAGNOSIS — I6529 Occlusion and stenosis of unspecified carotid artery: Secondary | ICD-10-CM | POA: Insufficient documentation

## 2022-07-25 DIAGNOSIS — I700 Atherosclerosis of aorta: Secondary | ICD-10-CM | POA: Diagnosis not present

## 2022-07-25 DIAGNOSIS — I739 Peripheral vascular disease, unspecified: Secondary | ICD-10-CM | POA: Insufficient documentation

## 2022-07-25 MED ORDER — AMLODIPINE BESYLATE 5 MG PO TABS: 5.0000 mg | ORAL_TABLET | Freq: Every morning | ORAL | 3 refills | Status: DC

## 2022-07-25 NOTE — Patient Instructions (Addendum)
Medication Instructions:  Please increase the amlodipine up to 5 mg daily  If you need a refill on your cardiac medications before your next appointment, please call your pharmacy.   Lab work: No new labs needed  Testing/Procedures: No new testing needed  Follow-Up: At [facility], you and your health needs are our priority.  As part of our continuing mission to provide you with exceptional heart care, we have created designated Provider Care Teams.  These Care Teams include your primary Cardiologist (physician) and Advanced Practice Providers (APPs -  Physician Assistants and Nurse Practitioners) who all work together to provide you with the care you need, when you need it.  You will need a follow up appointment in 6 months  Providers on your designated Care Team:   Murray Hodgkins, NP Christell Faith, PA-C Cadence Kathlen Mody, [place]  COVID-19 Vaccine Information can be found at: [email or web address] For questions related to vaccine distribution or appointments, please email [email or web address] or call [phone])[phone].

## 2022-08-19 ENCOUNTER — Other Ambulatory Visit
Admission: RE | Admit: 2022-08-19 | Discharge: 2022-08-19 | Disposition: A | Discharge status: Home / Self Care | Payer: Medicare Other | Source: Ambulatory Visit | Attending: Cardiology | Admitting: Cardiology

## 2022-08-19 ENCOUNTER — Encounter: Payer: Self-pay | Admitting: Cardiology

## 2022-08-19 ENCOUNTER — Ambulatory Visit: Payer: Medicare Other | Attending: Cardiology | Admitting: Cardiology

## 2022-08-19 VITALS — BP 134/68 | HR 95 | Ht 70.0 in | Wt 163.8 lb

## 2022-08-19 DIAGNOSIS — R42 Dizziness and giddiness: Secondary | ICD-10-CM | POA: Diagnosis not present

## 2022-08-19 DIAGNOSIS — H269 Unspecified cataract: Secondary | ICD-10-CM | POA: Insufficient documentation

## 2022-08-19 DIAGNOSIS — I10 Essential (primary) hypertension: Secondary | ICD-10-CM

## 2022-08-19 DIAGNOSIS — Z79899 Other long term (current) drug therapy: Secondary | ICD-10-CM | POA: Insufficient documentation

## 2022-08-19 DIAGNOSIS — M436 Torticollis: Secondary | ICD-10-CM | POA: Insufficient documentation

## 2022-08-19 DIAGNOSIS — I6529 Occlusion and stenosis of unspecified carotid artery: Secondary | ICD-10-CM

## 2022-08-19 DIAGNOSIS — F32A Depression, unspecified: Secondary | ICD-10-CM | POA: Insufficient documentation

## 2022-08-19 DIAGNOSIS — E782 Mixed hyperlipidemia: Secondary | ICD-10-CM

## 2022-08-19 DIAGNOSIS — T7840XA Allergy, unspecified, initial encounter: Secondary | ICD-10-CM | POA: Insufficient documentation

## 2022-08-19 DIAGNOSIS — I700 Atherosclerosis of aorta: Secondary | ICD-10-CM | POA: Diagnosis not present

## 2022-08-19 DIAGNOSIS — E785 Hyperlipidemia, unspecified: Secondary | ICD-10-CM | POA: Insufficient documentation

## 2022-08-19 DIAGNOSIS — G4700 Insomnia, unspecified: Secondary | ICD-10-CM | POA: Insufficient documentation

## 2022-08-19 DIAGNOSIS — J309 Allergic rhinitis, unspecified: Secondary | ICD-10-CM | POA: Insufficient documentation

## 2022-08-19 DIAGNOSIS — M542 Cervicalgia: Secondary | ICD-10-CM | POA: Insufficient documentation

## 2022-08-19 LAB — BASIC METABOLIC PANEL
Anion gap: 5 (ref 5–15)
BUN: 17 mg/dL (ref 8–23)
CO2: 29 mmol/L (ref 22–32)
Calcium: 9.2 mg/dL (ref 8.9–10.3)
Chloride: 99 mmol/L (ref 98–111)
Creatinine, Ser: 0.82 mg/dL (ref 0.61–1.24)
GFR, Estimated: >60 mL/min (ref >60)
Glucose, Bld: 116 mg/dL — ABNORMAL HIGH (ref 70–99)
Potassium: 4.3 mmol/L (ref 3.5–5.1)
Sodium: 133 mmol/L — ABNORMAL LOW (ref 135–145)

## 2022-08-19 MED ORDER — ACETAMINOPHEN 325 MG PO TABS
650.0000 mg | ORAL_TABLET | Freq: Four times a day (QID) | ORAL | Status: DC | PRN
Start: 2022-08-19 — End: 2024-03-28

## 2022-08-19 NOTE — Progress Notes (Signed)
Cardiology Clinic Note   Patient Name: Stuart Smith Date of Encounter: 08/19/2022  Primary Care Provider:  Maryland Pink, MD Primary Cardiologist:  None  Patient Profile    88 year old male with a past medical history of prostate cancer status post TURP, status post prostate resection, mild carotid and aortic atherosclerosis,peripheral arterial disease, cervicalgia, and hypertension who presents today with complaints of extreme dizziness.  Past Medical History    Past Medical History:  Diagnosis Date   Anemia    Cataracts, bilateral    Cervicalgia    Depression    Elevated PSA    H/O seasonal allergies    History of chickenpox    Hypertension    Insomnia, unspecified type    Major depressive disorder    PAD (peripheral artery disease) ([place])    Prostate cancer ([place]) 2013   Surgery    Torticollis    Past Surgical History:  Procedure Laterality Date   BACK SURGERY  1995   GALLBLADDER SURGERY     PROSTATE SURGERY  2013    Allergies  Allergies  Allergen Reactions   Keflex [Cephalexin] Rash    History of Present Illness    Stuart Smith is a 88 year old male with a past medical history of prostate cancer status post TURP, status post prostate resection, mild carotid and aortic atherosclerosis, peripheral arterial disease, cervical algia, and hypertension.  He was hospitalized at [facility] in 08/2018 for dizziness.  He was found to be secondary to vestibular neuritis.  He had a CT of the head, MRI of the brain which showed no acute intracranial pathology.  He was treated supportively with meclizine as needed and oral prednisone that has improved.  He underwent outpatient physical therapy with vestibular therapy.  Carotid duplex also showed no hemodynamically significant carotid artery stenosis.  Echocardiogram in 08/2018 revealed LVEF of 55-60%, wall motion was normal, there was no regional wall motion abnormalities, though with G1 DD, trivial aortic regurgitation, mild  mitral regurgitation.  He continues to have neck pain where he receives Botox every 3 months at [facility].  His recent injections the dose of Botox have been increased.  He was also started on Celebrex to help with some of the pain that he has.    He was last seen in clinic by Dr. Rockey Situ 07/25/2022 at that time he was doing well and was stable from a cardiac standpoint.  He denies any dizziness or falls.  Blood pressure was slightly elevated at 140/80 and his amlodipine was increased from 2.5 mg that he been started from his primary care provider to 5 mg daily.  He returns to clinic today with extreme complaints of dizziness and fatigue.  He denies any falls but has been orthostatic.  He states that he was started on Celebrex by his provider at [facility] and his Tylenol was discontinued to help with some of the discomfort that he has after his Botox injections. He has been taking it but since starting the Celebrex as long with taking the amlodipine he has had increased amounts of dizziness and is requesting today to have this both of those medications removed and allow him to go back to Tylenol for pain management to see if removing the amlodipine would help with his dizziness.  He denies any chest pain, shortness of breath, or palpitations but does endorse some occasional peripheral edema to the bilateral lower extremities around the ankles.  Home Medications    Current Outpatient Medications  Medication Sig Dispense  Refill   aspirin 81 MG tablet Take 81 mg by mouth daily.     calcium carbonate (OSCAL) 1500 (600 Ca) MG TABS tablet Take by mouth.     cetirizine (ZYRTEC) 10 MG tablet Take 10 mg by mouth daily.     clonazePAM (KLONOPIN) 0.5 MG tablet Take 0.5 mg by mouth 3 (three) times daily.     DENTAGEL 1.1 % GEL dental gel Place onto teeth at bedtime.     Leuprolide Acetate, 6 Month, (LUPRON) 45 MG injection Inject 45 mg into the muscle every 6 (six) months.     lisinopril (ZESTRIL) 10 MG tablet Take 1  tablet by mouth once daily 30 tablet 0   Magnesium 400 MG TABS Take 400 mg by mouth daily.     Multiple Vitamin (MULTIVITAMIN) capsule Take 1 capsule by mouth daily.     Travoprost, BAK Free, (TRAVATAN) 0.004 % SOLN ophthalmic solution INSTILL 1 DROP INTO EACH EYE AT BEDTIME     traZODone (DESYREL) 50 MG tablet Take 50 mg by mouth at bedtime.     vitamin C (ASCORBIC ACID) 500 MG tablet Take 500 mg by mouth daily.      No current facility-administered medications for this visit.     Family History    Family History  Problem Relation Age of Onset   Hypertension Mother    Prostate cancer Brother    Diabetes Mellitus II Brother    Hypertension Brother    He indicated that his mother is deceased. He indicated that the status of his brother is unknown.  Social History    Social History   Socioeconomic History   Marital status: Married    Spouse name: Not on file   Number of children: Not on file   Years of education: Not on file   Highest education level: Not on file  Occupational History   Not on file  Tobacco Use   Smoking status: Never    Passive exposure: Never   Smokeless tobacco: Never  Vaping Use   Vaping Use: Never used  Substance and Sexual Activity   Alcohol use: Yes    Alcohol/week: 1.0 standard drink of alcohol    Types: 1 Glasses of wine per week    Comment: 1x glass of red wine daily per pt   Drug use: No   Sexual activity: Not on file  Other Topics Concern   Not on file  Social History Narrative   Independent at baseline.  Lives with wife at [facility] independent facility   Social Determinants of Health   Financial Resource Strain: Not on file  Food Insecurity: Not on file  Transportation Needs: Not on file  Physical Activity: Not on file  Stress: Not on file  Social Connections: Not on file  Intimate Partner Violence: Not on file     Review of Systems    General:  No chills, fever, night sweats or weight changes.  Endorses  fatigue Cardiovascular:  No chest pain, dyspnea on exertion, endorses occasional peripheral edema edema, orthopnea, palpitations, paroxysmal nocturnal dyspnea. Dermatological: No rash, lesions/masses Respiratory: No cough, dyspnea Urologic: No hematuria, dysuria Abdominal:   No nausea, vomiting, diarrhea, bright red blood per rectum, melena, or hematemesis Neurologic:  No visual changes, wkns, changes in mental status.  Endorses extreme dizziness All other systems reviewed and are otherwise negative except as noted above.   Physical Exam    VS:  BP 134/68 (BP Location: Left Arm, Patient Position: Supine, Cuff Size:  Normal)   Pulse 95   Ht '5\' 10"'$  (1.778 m)   Wt 163 lb 12.8 oz (74.3 kg)   SpO2 96%   BMI 23.50 kg/m  , BMI Body mass index is 23.5 kg/m.     GEN: Well nourished, well developed, in no acute distress. HEENT: normal. Neck: Supple, no JVD, carotid bruits, or masses. Cardiac: RRR, no murmurs, rubs, or gallops. No clubbing, cyanosis, edema.  Radials/DP/PT 2+ and equal bilaterally.  Respiratory:  Respirations regular and unlabored, clear to auscultation bilaterally. GI: Soft, nontender, nondistended, BS + x 4. MS: no deformity or atrophy. Skin: warm and dry, no rash. Neuro:  Strength and sensation are intact. Psych: Normal affect.  Accessory Clinical Findings    ECG personally reviewed by me today-sinus rhythm with a rate of 82 with unifocal PVCs, left axis deviation, chronic right bundle branch block, and LVH- No acute changes  Lab Results  Component Value Date   WBC 6.2 11/29/2021   HGB 13.3 11/29/2021   HCT 38.8 (L) 11/29/2021   MCV 99.0 11/29/2021   PLT 216 11/29/2021   Lab Results  Component Value Date   CREATININE 0.68 11/29/2021   BUN 20 11/29/2021   NA 127 (L) 11/29/2021   K 4.5 11/29/2021   CL 94 (L) 11/29/2021   CO2 27 11/29/2021   Lab Results  Component Value Date   ALT 17 11/29/2021   AST 14 (L) 11/29/2021   ALKPHOS 63 11/29/2021   BILITOT 0.3  11/29/2021   No results found for: "CHOL", "HDL", "LDLCALC", "LDLDIRECT", "TRIG", "CHOLHDL"  No results found for: "HGBA1C"  Assessment & Plan   1.  Dizziness and lightheadedness without orthostasis.  Patient has had longstanding history of dizziness.  At orthostatic vitals were completed today which did not reveal any orthostasis.  He has noted that the dizziness has worsened since he was increased on amlodipine to 5 mg daily and started taking Celebrex for the discomfort that he was having in his neck after his last Botox injection.  He is requesting today with his dizziness and to stop the Celebrex and go back on Tylenol 650 mg every 6 hours as needed and to hold amlodipine.  At this time this seems a reasonable request as he is high risk for falls if he has continued dizziness.  2.  Hypertension with blood pressure today of 134/68.  During orthostatic vitals he had average from 1 30-1 20 with positional changes.  He is going to continue on lisinopril 10 mg daily.  he is going to discontinue his Celebrex and go back to Tylenol and hold his amlodipine.  The amlodipine may need to be started when he returns to his follow-up appointment.  he has been encouraged to continue his monitoring his blood pressure at home taking his pressure 1 to 2 hours after he takes his medication.  He is also been encouraged to continue to decrease his sodium intake as that can likely be a driver of elevated blood pressures.  He and his wife are both reminded that elevated blood pressures can also cause dizziness.  3.  PVCs noted on EKG.  Chronic finding as he is asymptomatic.  Was sent for BMP today to check potassium and sodium levels as he has a history of hyponatremia.  Also with being on Celebrex and lisinopril the risk of hyperkalemia is increased.  4.  Carotid stenosis with mild disease is seen on carotid duplex.  He is continued on a daily aspirin but has  declined statin therapy  5.  History of hyponatremia with  sodium of 131 on 07/03/2022.  Continues to remain stable.  He has been encouraged to continue to limit his free water to prevent recurrent episodes of worsening hyponatremia.  6.  Disposition patient return to clinic in 2 to 3 weeks or sooner if needed to see MD/APP to follow back up on his symptoms and monitoring of his blood pressure after being off of amlodipine as he previously had systolic blood pressures of 170 prior to initiation.  Amiel Mccaffrey, NP 08/19/2022, 2:33 PM

## 2022-08-19 NOTE — Progress Notes (Signed)
Please let Mr. Stuart Smith know that his potassium remains normal and his sodium has continued to improve. Continue with the medication changes that we discussed earlier today. Thanks.

## 2022-08-19 NOTE — Patient Instructions (Signed)
Medication Instructions:  STOP: Celebrex 200 mg STOP: Amlodipine 5 mg RESTART: Tylenol 650 mg every 6 hours as needed for pain  *If you need a refill on your cardiac medications before your next appointment, please call your pharmacy*   Lab Work: Your provider would like for you to have the following labs drawn today: BMP.   Please go to the [facility] entrance and check in at the front desk.  You do not need an appointment.  You do not need to be fasting.  If you have labs (blood work) drawn today and your tests are completely normal, you will receive your results only by: [place] (if you have MyChart) OR A paper copy in the mail If you have any lab test that is abnormal or we need to change your treatment, we will call you to review the results.   Testing/Procedures: None ordered today   Follow-Up: At [facility], you and your health needs are our priority.  As part of our continuing mission to provide you with exceptional heart care, we have created designated Provider Care Teams.  These Care Teams include your primary Cardiologist (physician) and Advanced Practice Providers (APPs -  Physician Assistants and Nurse Practitioners) who all work together to provide you with the care you need, when you need it.  We recommend signing up for the patient portal called "MyChart".  Sign up information is provided on this After Visit Summary.  MyChart is used to connect with patients for Virtual Visits (Telemedicine).  Patients are able to view lab/test results, encounter notes, upcoming appointments, etc.  Non-urgent messages can be sent to your provider as well.   To learn more about what you can do with MyChart, go to [email or web address].    Your next appointment:   2-3 week(s)  The format for your next appointment:   In Person  Provider:   Ida Rogue, MD

## 2022-08-22 ENCOUNTER — Other Ambulatory Visit: Payer: Self-pay | Admitting: Cardiovascular Disease

## 2022-09-09 ENCOUNTER — Ambulatory Visit: Payer: Medicare Other | Attending: Cardiology | Admitting: Cardiology

## 2022-09-09 ENCOUNTER — Encounter: Payer: Self-pay | Admitting: Cardiology

## 2022-09-09 VITALS — BP 148/78 | HR 70 | Ht 70.0 in | Wt 167.0 lb

## 2022-09-09 DIAGNOSIS — I10 Essential (primary) hypertension: Secondary | ICD-10-CM | POA: Diagnosis not present

## 2022-09-09 DIAGNOSIS — I6529 Occlusion and stenosis of unspecified carotid artery: Secondary | ICD-10-CM | POA: Diagnosis not present

## 2022-09-09 DIAGNOSIS — R42 Dizziness and giddiness: Secondary | ICD-10-CM | POA: Insufficient documentation

## 2022-09-09 DIAGNOSIS — E782 Mixed hyperlipidemia: Secondary | ICD-10-CM | POA: Diagnosis not present

## 2022-09-09 DIAGNOSIS — E871 Hypo-osmolality and hyponatremia: Secondary | ICD-10-CM | POA: Insufficient documentation

## 2022-09-09 NOTE — Patient Instructions (Signed)
Medication Instructions:  No changes at this time.   *If you need a refill on your cardiac medications before your next appointment, please call your pharmacy*   Lab Work: None  If you have labs (blood work) drawn today and your tests are completely normal, you will receive your results only by: [place] (if you have MyChart) OR A paper copy in the mail If you have any lab test that is abnormal or we need to change your treatment, we will call you to review the results.   Testing/Procedures: None   Follow-Up: At [facility], you and your health needs are our priority.  As part of our continuing mission to provide you with exceptional heart care, we have created designated Provider Care Teams.  These Care Teams include your primary Cardiologist (physician) and Advanced Practice Providers (APPs -  Physician Assistants and Nurse Practitioners) who all work together to provide you with the care you need, when you need it.   Your next appointment:   3 month(s)  The format for your next appointment:   In Person  Provider:   Ida Rogue, MD       Important Information About Sugar

## 2022-09-09 NOTE — Progress Notes (Signed)
Cardiology Clinic Note   Patient Name: Stuart Smith Date of Encounter: 09/09/2022  Primary Care Provider:  [place] Pink, MD Primary Cardiologist:  None  Patient Profile    88 year old male with a past medical history of prostate cancer status post TURP, status post prostate resection, mild carotid and aortic atherosclerosis, peripheral artery disease, cervicalgia hypertension, who presents today for follow-up  Past Medical History    Past Medical History:  Diagnosis Date   Anemia    Cataracts, bilateral    Cervicalgia    Depression    Elevated PSA    H/O seasonal allergies    History of chickenpox    Hypertension    Insomnia, unspecified type    Major depressive disorder    PAD (peripheral artery disease) ([place])    Prostate cancer ([place]) 2013   Surgery    Torticollis    Past Surgical History:  Procedure Laterality Date   [place]  2013    Allergies  Allergies  Allergen Reactions   Keflex [Cephalexin] Rash    History of Present Illness    Stuart Smith is a 88 year old male with past medical history of prostate cancer status post CRT-P, he is status post prostate resection, mild carotid and aortic atherosclerosis, peripheral arterial disease, cervicalgia, and hypertension.  Was hospitalized at [facility] on 08/2018 for dizziness.  This was found to be secondary to vestibular neuritis.  He had a CT of his head, MRI of the brain which showed no acute intracranial pathology.  He was treated supportively with meclizine as needed and oral prednisone that has improved.  He underwent outpatient physical therapy with vestibular therapy.  Carotid duplex also showed no hemodynamically significant carotid artery stenosis.  Echocardiogram in 08/2018 revealed LVEF of 55-60%, wall motion was normal, G1 DD, trivial aortic regurgitation and mild mitral regurgitation.  He continues to have neck pain where he receives Botox  therapy every 3 months at [facility].  Recent checks and dose of Botox been increased.  He also started on Celebrex to help with some of the pain that he has been suffering from.  He was last seen in clinic on 08/19/2022 with complaints of extreme dizziness and fatigue.  He denied any falls but did been orthostatic.  He states that he was started on Celebrex by his provider at [facility] and his Tylenol was discontinued to help with the discomfort he was having after his Botox injections had been completed.  At his visit his amlodipine and been discontinued and he had stopped taking the previously ordered Celebrex and started taking Tylenol again per his request.    He returns to clinic today accompanied by his wife.  He states that his blood pressures at home have been improved since being off of amlodipine.  His blood pressure log in today which shows significantly improved blood pressures.  He is also no longer taking Celebrex and is only taking Tylenol arthritis for his neck pain.  He currently denies any chest pain, shortness of breath, recurrent dizziness, near syncope, or syncopal episodes.  He also denies any hospitalizations or visits to the emergency department.  Home Medications    Current Outpatient Medications  Medication Sig Dispense Refill   acetaminophen (TYLENOL) 325 MG tablet Take 2 tablets (650 mg total) by mouth every 6 (six) hours as needed.     aspirin 81 MG tablet Take 81 mg by mouth daily.  calcium carbonate (OSCAL) 1500 (600 Ca) MG TABS tablet Take by mouth.     cetirizine (ZYRTEC) 10 MG tablet Take 10 mg by mouth daily.     clonazePAM (KLONOPIN) 0.5 MG tablet Take 0.5 mg by mouth 3 (three) times daily.     DENTAGEL 1.1 % GEL dental gel Place onto teeth at bedtime.     Leuprolide Acetate, 6 Month, (LUPRON) 45 MG injection Inject 45 mg into the muscle every 6 (six) months.     lisinopril (ZESTRIL) 10 MG tablet Take 1 tablet by mouth once daily 30 tablet 0   Magnesium 400 MG TABS  Take 400 mg by mouth daily.     Multiple Vitamin (MULTIVITAMIN) capsule Take 1 capsule by mouth daily.     Travoprost, BAK Free, (TRAVATAN) 0.004 % SOLN ophthalmic solution INSTILL 1 DROP INTO EACH EYE AT BEDTIME     traZODone (DESYREL) 50 MG tablet Take 50 mg by mouth at bedtime.     vitamin C (ASCORBIC ACID) 500 MG tablet Take 500 mg by mouth daily.      No current facility-administered medications for this visit.     Family History    Family History  Problem Relation Age of Onset   Hypertension Mother    Prostate cancer Brother    Diabetes Mellitus II Brother    Hypertension Brother    He indicated that his mother is deceased. He indicated that the status of his brother is unknown.  Social History    Social History   Socioeconomic History   Marital status: Married    Spouse name: Not on file   Number of children: Not on file   Years of education: Not on file   Highest education level: Not on file  Occupational History   Not on file  Tobacco Use   Smoking status: Never    Passive exposure: Never   Smokeless tobacco: Never  Vaping Use   Vaping Use: Never used  Substance and Sexual Activity   Alcohol use: Yes    Alcohol/week: 1.0 standard drink of alcohol    Types: 1 Glasses of wine per week    Comment: 1x glass of red wine daily per pt   Drug use: No   Sexual activity: Not on file  Other Topics Concern   Not on file  Social History Narrative   Independent at baseline.  Lives with wife at [facility] independent facility   Social Determinants of Health   Financial Resource Strain: Not on file  Food Insecurity: Not on file  Transportation Needs: Not on file  Physical Activity: Not on file  Stress: Not on file  Social Connections: Not on file  Intimate Partner Violence: Not on file     Review of Systems    General:  No chills, fever, night sweats or weight changes.  Endorses fatigue Cardiovascular:  No chest pain, dyspnea on exertion, endorses occasional  peripheral edema, orthopnea, palpitations, paroxysmal nocturnal dyspnea. Dermatological: No rash, lesions/masses Respiratory: No cough, dyspnea Urologic: No hematuria, dysuria Abdominal:   No nausea, vomiting, diarrhea, bright red blood per rectum, melena, or hematemesis Neurologic:  No visual changes, endorses wkns, changes in mental status.  Dizziness has improved All other systems reviewed and are otherwise negative except as noted above.   Physical Exam    VS:  BP (!) 148/78 (BP Location: Left Arm, Patient Position: Sitting, Cuff Size: Normal)   Pulse 70   Ht '5\' 10"'$  (1.778 m)   Wt 167  lb (75.8 kg)   SpO2 95%   BMI 23.96 kg/m  , BMI Body mass index is 23.96 kg/m.     Vitals:   09/09/22 1514 09/09/22 1531  BP: (!) 166/80 (!) 148/78    GEN: Well nourished, well developed, in no acute distress. HEENT: normal. Neck: Supple, no JVD, carotid bruits, or masses. Cardiac: RRR, no murmurs, rubs, or gallops. No clubbing, cyanosis, edema.  Radials/DP/PT 2+ and equal bilaterally.  Respiratory:  Respirations regular and unlabored, clear to auscultation bilaterally. GI: Soft, nontender, nondistended, BS + x 4. MS: no deformity or atrophy. Skin: warm and dry, no rash. Neuro:  Strength and sensation are intact. Psych: Normal affect.  Accessory Clinical Findings    ECG personally reviewed by me today-no new tracings were completed today  Lab Results  Component Value Date   WBC 6.2 11/29/2021   HGB 13.3 11/29/2021   HCT 38.8 (L) 11/29/2021   MCV 99.0 11/29/2021   PLT 216 11/29/2021   Lab Results  Component Value Date   CREATININE 0.82 08/19/2022   BUN 17 08/19/2022   NA 133 (L) 08/19/2022   K 4.3 08/19/2022   CL 99 08/19/2022   CO2 29 08/19/2022   Lab Results  Component Value Date   ALT 17 11/29/2021   AST 14 (L) 11/29/2021   ALKPHOS 63 11/29/2021   BILITOT 0.3 11/29/2021   No results found for: "CHOL", "HDL", "LDLCALC", "LDLDIRECT", "TRIG", "CHOLHDL"  No results found  for: "HGBA1C"  Assessment & Plan   1.  Dizziness and lightheadedness without orthostasis has improved since discontinuing Celebrex and starting him on Tylenol arthritis as well as discontinuing his amlodipine at this time.  We did advise he and his wife that he may need to restart the amlodipine at the 2.5 mg dosing if his blood pressure does elevate.  He has been taking his blood pressure at home 1 to 2 hours after taking medications and has revealed blood pressures 130s over 70s for the last several weeks since his last visit.  2.  Hypertension with blood pressure today 166/80 repeat was 148/78 after he had been sitting in the room for several minutes.  Unfortunately his appointment was behind today and he had to wait which made him extremely agitated.  He has been continued on his lisinopril.  There were no other medication changes made today and he has been encouraged to continue to monitor his blood pressure at home 1 to 2 hours after he takes his medication.  3.  Carotid stenosis with mild disease seen on carotid duplex.  He is continued on his daily aspirin but has continued to decline statin therapy.  4.  History of hyponatremia with his last sodium level being 133.  Continues to remain stable.  He has continued to be encouraged to limit his free water as he drinks 50+6 of water per day and not continue to add salt to his foods.  We have advised he and his wife that often hyponatremia is a water issue not a salt issue and that when he drinks extensive amounts of water he has flushing of the sodium out of his blood which causes his hyponatremia.  5.  Mixed hyperlipidemia with LDL of 7512/16/22 this continues to be followed by his PCP.  He also continues to decline statin therapy.  6.  Disposition patient to return to clinic to see MD/APP in 3 months or sooner if needed since he has had the resolution of most of his complaints  today.  Kailan Laws, NP 09/09/2022, 5:31 PM

## 2022-09-21 ENCOUNTER — Other Ambulatory Visit: Payer: Self-pay | Admitting: Cardiovascular Disease

## 2022-10-07 ENCOUNTER — Telehealth: Payer: Self-pay | Admitting: *Deleted

## 2022-10-07 NOTE — Telephone Encounter (Signed)
[place]. Wife called and stated that patient is having UTI symptoms and requesting a Urine to be obtained. Patient currently not at home, at dealership getting care serviced.   Symptoms: Hot and cold. Urine dark x 2-3 days. No fever. Frequency. No abdominal pain. No burning.   Please Advise.

## 2022-10-07 NOTE — Telephone Encounter (Signed)
Wife Notified and agreed.

## 2022-10-07 NOTE — Telephone Encounter (Signed)
Would recommend he call his PCP or go to urgent care for evaluation

## 2022-10-30 ENCOUNTER — Encounter: Payer: Self-pay | Admitting: Dermatology

## 2022-10-30 ENCOUNTER — Ambulatory Visit (INDEPENDENT_AMBULATORY_CARE_PROVIDER_SITE_OTHER): Payer: Medicare Other | Admitting: Dermatology

## 2022-10-30 VITALS — BP 137/73 | HR 70

## 2022-10-30 DIAGNOSIS — D692 Other nonthrombocytopenic purpura: Secondary | ICD-10-CM | POA: Diagnosis not present

## 2022-10-30 DIAGNOSIS — L820 Inflamed seborrheic keratosis: Secondary | ICD-10-CM

## 2022-10-30 DIAGNOSIS — I6529 Occlusion and stenosis of unspecified carotid artery: Secondary | ICD-10-CM

## 2022-10-30 DIAGNOSIS — Z1283 Encounter for screening for malignant neoplasm of skin: Secondary | ICD-10-CM

## 2022-10-30 DIAGNOSIS — L814 Other melanin hyperpigmentation: Secondary | ICD-10-CM | POA: Diagnosis not present

## 2022-10-30 DIAGNOSIS — L578 Other skin changes due to chronic exposure to nonionizing radiation: Secondary | ICD-10-CM

## 2022-10-30 DIAGNOSIS — D229 Melanocytic nevi, unspecified: Secondary | ICD-10-CM

## 2022-10-30 DIAGNOSIS — L821 Other seborrheic keratosis: Secondary | ICD-10-CM

## 2022-10-30 NOTE — Progress Notes (Signed)
Follow-Up Visit   Subjective  Stuart Smith is a 88 y.o. male who presents for the following: Annual Exam (1 year tbse, hx of aks, hx of isk, concerned about spots at left cheek/).  The patient presents for Total-Body Skin Exam (TBSE) for skin cancer screening and mole check.  The patient has spots, moles and lesions to be evaluated, some may be new or changing and the patient has concerns that these could be cancer.  The following portions of the chart were reviewed this encounter and updated as appropriate:  Tobacco  Allergies  Meds  Problems  Med Hx  Surg Hx  Fam Hx     Review of Systems: No other skin or systemic complaints except as noted in HPI or Assessment and Plan.  Objective  Well appearing patient in no apparent distress; mood and affect are within normal limits.  A full examination was performed including scalp, head, eyes, ears, nose, lips, neck, chest, axillae, abdomen, back, buttocks, bilateral upper extremities, bilateral lower extremities, hands, feet, fingers, toes, fingernails, and toenails. All findings within normal limits unless otherwise noted below.  face and neck x 18, left pretibial x 1 (19) Erythematous stuck-on, waxy papule or plaque   Assessment & Plan  Inflamed seborrheic keratosis (19) face and neck x 18, left pretibial x 1  Symptomatic, irritating, patient would like treated.  Destruction of lesion - face and neck x 18, left pretibial x 1 Complexity: simple   Destruction method: cryotherapy   Informed consent: discussed and consent obtained   Timeout:  patient name, date of birth, surgical site, and procedure verified Lesion destroyed using liquid nitrogen: Yes   Region frozen until ice ball extended beyond lesion: Yes   Outcome: patient tolerated procedure well with no complications   Post-procedure details: wound care instructions given   Additional details:  Prior to procedure, discussed risks of blister formation, small wound, skin  dyspigmentation, or rare scar following cryotherapy. Recommend Vaseline ointment to treated areas while healing.   Lentigines - Scattered tan macules - Due to sun exposure - Benign-appearing, observe - Recommend daily broad spectrum sunscreen SPF 30+ to sun-exposed areas, reapply every 2 hours as needed. - Call for any changes  Purpura - Chronic; persistent and recurrent.  Treatable, but not curable. At lower legs - Violaceous macules and patches - Benign - Related to trauma, age, sun damage and/or use of blood thinners, chronic use of topical and/or oral steroids - Observe - Can use OTC arnica containing moisturizer such as Dermend Bruise Formula if desired - Call for worsening or other concerns  Seborrheic Keratoses - Stuck-on, waxy, tan-brown papules and/or plaques  - Benign-appearing - Discussed benign etiology and prognosis. - Observe - Call for any changes  Melanocytic Nevi - Tan-brown and/or pink-flesh-colored symmetric macules and papules - Benign appearing on exam today - Observation - Call clinic for new or changing moles - Recommend daily use of broad spectrum spf 30+ sunscreen to sun-exposed areas.   Hemangiomas - Red papules - Discussed benign nature - Observe - Call for any changes  Actinic Damage - Chronic condition, secondary to cumulative UV/sun exposure - diffuse scaly erythematous macules with underlying dyspigmentation - Recommend daily broad spectrum sunscreen SPF 30+ to sun-exposed areas, reapply every 2 hours as needed.  - Staying in the shade or wearing long sleeves, sun glasses (UVA+UVB protection) and wide brim hats (4-inch brim around the entire circumference of the hat) are also recommended for sun protection.  - Call  for new or changing lesions.  Skin cancer screening performed today. Return in about 1 year (around 10/31/2023) for TBSE.  IRuthell Rummage, CMA, am acting as scribe for Sarina Ser, MD. Documentation: I have reviewed the  above documentation for accuracy and completeness, and I agree with the above.  Sarina Ser, MD

## 2022-10-30 NOTE — Patient Instructions (Addendum)
Seborrheic Keratosis  What causes seborrheic keratoses? Seborrheic keratoses are harmless, common skin growths that first appear during adult life.  As time goes by, more growths appear.  Some people may develop a large number of them.  Seborrheic keratoses appear on both covered and uncovered body parts.  They are not caused by sunlight.  The tendency to develop seborrheic keratoses can be inherited.  They vary in color from skin-colored to gray, brown, or even black.  They can be either smooth or have a rough, warty surface.   Seborrheic keratoses are superficial and look as if they were stuck on the skin.  Under the microscope this type of keratosis looks like layers upon layers of skin.  That is why at times the top layer may seem to fall off, but the rest of the growth remains and re-grows.    Treatment Seborrheic keratoses do not need to be treated, but can easily be removed in the office.  Seborrheic keratoses often cause symptoms when they rub on clothing or jewelry.  Lesions can be in the way of shaving.  If they become inflamed, they can cause itching, soreness, or burning.  Removal of a seborrheic keratosis can be accomplished by freezing, burning, or surgery. If any spot bleeds, scabs, or grows rapidly, please return to have it checked, as these can be an indication of a skin cancer.  Cryotherapy Aftercare  Wash gently with soap and water everyday.   Apply Vaseline and Band-Aid daily until healed.       Melanoma ABCDEs  Melanoma is the most dangerous type of skin cancer, and is the leading cause of death from skin disease.  You are more likely to develop melanoma if you: Have light-colored skin, light-colored eyes, or red or blond hair Spend a lot of time in the sun Tan regularly, either outdoors or in a tanning bed Have had blistering sunburns, especially during childhood Have a close family member who has had a melanoma Have atypical moles or large birthmarks  Early  detection of melanoma is key since treatment is typically straightforward and cure rates are extremely high if we catch it early.   The first sign of melanoma is often a change in a mole or a new dark spot.  The ABCDE system is a way of remembering the signs of melanoma.  A for asymmetry:  The two halves do not match. B for border:  The edges of the growth are irregular. C for color:  A mixture of colors are present instead of an even brown color. D for diameter:  Melanomas are usually (but not always) greater than 6mm - the size of a pencil eraser. E for evolution:  The spot keeps changing in size, shape, and color.  Please check your skin once per month between visits. You can use a small mirror in front and a large mirror behind you to keep an eye on the back side or your body.   If you see any new or changing lesions before your next follow-up, please call to schedule a visit.  Please continue daily skin protection including broad spectrum sunscreen SPF 30+ to sun-exposed areas, reapplying every 2 hours as needed when you're outdoors.   Staying in the shade or wearing long sleeves, sun glasses (UVA+UVB protection) and wide brim hats (4-inch brim around the entire circumference of the hat) are also recommended for sun protection.     Due to recent changes in healthcare laws, you may see results of   your pathology and/or laboratory studies on MyChart before the doctors have had a chance to review them. We understand that in some cases there may be results that are confusing or concerning to you. Please understand that not all results are received at the same time and often the doctors may need to interpret multiple results in order to provide you with the best plan of care or course of treatment. Therefore, we ask that you please give us 2 business days to thoroughly review all your results before contacting the office for clarification. Should we see a critical lab result, you will be contacted  sooner.   If You Need Anything After Your Visit  If you have any questions or concerns for your doctor, please call our main line at [phone] and press option 4 to reach your doctor's medical assistant. If no one answers, please leave a voicemail as directed and we will return your call as soon as possible. Messages left after 4 pm will be answered the following business day.   You may also send us a message via MyChart. We typically respond to MyChart messages within 1-2 business days.  For prescription refills, please ask your pharmacy to contact our office. Our fax number is [phone].  If you have an urgent issue when the clinic is closed that cannot wait until the next business day, you can page your doctor at the number below.    Please note that while we do our best to be available for urgent issues outside of office hours, we are not available 24/7.   If you have an urgent issue and are unable to reach us, you may choose to seek medical care at your doctor's office, retail clinic, urgent care center, or emergency room.  If you have a medical emergency, please immediately call 911 or go to the emergency department.  Pager Numbers  - Dr. Kowalski: [phone]  - Dr. Moye: [phone]  - Dr. Stewart: [phone]  In the event of inclement weather, please call our main line at [phone] for an update on the status of any delays or closures.  Dermatology Medication Tips: Please keep the boxes that topical medications come in in order to help keep track of the instructions about where and how to use these. Pharmacies typically print the medication instructions only on the boxes and not directly on the medication tubes.   If your medication is too expensive, please contact our office at [phone] option 4 or send us a message through MyChart.   We are unable to tell what your co-pay for medications will be in advance as this is different depending on your insurance  coverage. However, we may be able to find a substitute medication at lower cost or fill out paperwork to get insurance to cover a needed medication.   If a prior authorization is required to get your medication covered by your insurance company, please allow us 1-2 business days to complete this process.  Drug prices often vary depending on where the prescription is filled and some pharmacies may offer cheaper prices.  The website www.goodrx.com contains coupons for medications through different pharmacies. The prices here do not account for what the cost may be with help from insurance (it may be cheaper with your insurance), but the website can give you the price if you did not use any insurance.  - You can print the associated coupon and take it with your prescription to the pharmacy.  - You may also stop   by our office during regular business hours and pick up a GoodRx coupon card.  - If you need your prescription sent electronically to a different pharmacy, notify our office through [facility] MyChart or by phone at [phone] option 4.     Si Usted Necesita Algo Despus de Su Visita  Tambin puede enviarnos un mensaje a travs de MyChart. Por lo general respondemos a los mensajes de MyChart en el transcurso de 1 a 2 das hbiles.  Para renovar recetas, por favor pida a su farmacia que se ponga en contacto con nuestra oficina. Nuestro nmero de fax es el [phone].  Si tiene un asunto urgente cuando la clnica est cerrada y que no puede esperar hasta el siguiente da hbil, puede llamar/localizar a su doctor(a) al nmero que aparece a continuacin.   Por favor, tenga en cuenta que aunque hacemos todo lo posible para estar disponibles para asuntos urgentes fuera del horario de oficina, no estamos disponibles las 24 horas del da, los 7 das de la semana.   Si tiene un problema urgente y no puede comunicarse con nosotros, puede optar por buscar atencin mdica  en el consultorio de  su doctor(a), en una clnica privada, en un centro de atencin urgente o en una sala de emergencias.  Si tiene una emergencia mdica, por favor llame inmediatamente al 911 o vaya a la sala de emergencias.  Nmeros de bper  - Dr. Kowalski: [phone]  - Dra. Moye: [phone]  - Dra. Stewart: [phone]  En caso de inclemencias del tiempo, por favor llame a nuestra lnea principal al [phone] para una actualizacin sobre el estado de cualquier retraso o cierre.  Consejos para la medicacin en dermatologa: Por favor, guarde las cajas en las que vienen los medicamentos de uso tpico para ayudarle a seguir las instrucciones sobre dnde y cmo usarlos. Las farmacias generalmente imprimen las instrucciones del medicamento slo en las cajas y no directamente en los tubos del medicamento.   Si su medicamento es muy caro, por favor, pngase en contacto con nuestra oficina llamando al [phone] y presione la opcin 4 o envenos un mensaje a travs de MyChart.   No podemos decirle cul ser su copago por los medicamentos por adelantado ya que esto es diferente dependiendo de la cobertura de su seguro. Sin embargo, es posible que podamos encontrar un medicamento sustituto a menor costo o llenar un formulario para que el seguro cubra el medicamento que se considera necesario.   Si se requiere una autorizacin previa para que su compaa de seguros cubra su medicamento, por favor permtanos de 1 a 2 das hbiles para completar este proceso.  Los precios de los medicamentos varan con frecuencia dependiendo del lugar de dnde se surte la receta y alguna farmacias pueden ofrecer precios ms baratos.  El sitio web www.goodrx.com tiene cupones para medicamentos de diferentes farmacias. Los precios aqu no tienen en cuenta lo que podra costar con la ayuda del seguro (puede ser ms barato con su seguro), pero el sitio web puede darle el precio si no utiliz ningn seguro.  - Puede imprimir el  cupn correspondiente y llevarlo con su receta a la farmacia.  - Tambin puede pasar por nuestra oficina durante el horario de atencin regular y recoger una tarjeta de cupones de GoodRx.  - Si necesita que su receta se enve electrnicamente a una farmacia diferente, informe a nuestra oficina a travs de MyChart de [place] o por telfono llamando al [phone] y presione la opcin 4.    opcin 4.

## 2022-11-13 ENCOUNTER — Encounter: Payer: Self-pay | Admitting: Dermatology

## 2022-12-13 NOTE — Progress Notes (Unsigned)
Cardiology Office Note  Date:  12/15/2022   ID:  Stuart Smith, DOB [birth year], MRN [id number]  PCP:  Maryland Pink, MD   Chief Complaint  Patient presents with   3 month follow up     Patient c/o twinges in chest at times and occasional shortness of breath with activity. Medications reviewed by the patient verbally.     HPI:  Stuart Smith is a 89 -year-old gentleman with a  long history of prostate issues, s/p TURP,  found to have prostate cancer, status post prostate resection,  Currently lives at [facility] [place]  Mild carotid and aortic atherosclerosis who presents for routine followup of his abdominal aortic atherosclerosis, HTN  Last seen in clinic by myself September 2023 Seen by one of our providers October 2023 Reported having orthostasis, low blood pressure,  blood pressures at that time improved off amlodipine  In 5 oh up today main complaint is Severe neck pain [facility], has Botox injections every 3 months Not scheduled to follow-up till end of February but reports recently symptoms of neck pain have been severe Reports all he has is Tylenol for symptoms  Tried to do some cleaning in the shower today, neck pain  is severe on his visit today Blood pressure running high, unable to open his mouth secondary to spasm of the muscles in his jaw extending up the front of his face BP at home: 130-140/60-70  Not on celebrex for pain, prior concern this was making his lisinopril less effective  Reports having occasional light twinges in chest Better with burping, does not think it is cardiac in etiology  EKG personally reviewed by myself on todays visit Normal sinus rhythm rate 91 bpm right bundle branch block, left anterior fascicular block  Sedentary, uses a cane Has leg weakness, no longer goes to play golf or use the gym Labs reviewed Total chol, 162 LDL 75  History of vestibular problem 08/22/2018 hospital Carotid u/s showing B/l Mild soft and hard plaque in  the carotid bulb and proximal ICA Did vestibular PT  Severe reaction left arm, after PNA short  Had near syncope Has picture of left arm, swollen, erythematous shoulder to wrist Started to imprve in one week, Month to get better,    unable to complete the hormone therapy, last round of treatment was January 2015. He was unable to receive the radioactive seeds as he had a previous TURP  Echo 08/2018 - Left ventricle: The cavity size was normal. There was moderate   concentric hypertrophy. Systolic function was normal. The   estimated ejection fraction was in the range of 55% to 60%. Wall   motion was normal; there were no regional wall motion   abnormalities. Doppler parameters are consistent with abnormal   left ventricular relaxation (grade 1 diastolic dysfunction). - Aortic valve: There was trivial regurgitation. - Mitral valve: Calcified annulus. There was mild regurgitation.    PMH:   has a past medical history of Anemia, Cataracts, bilateral, Cervicalgia, Depression, Elevated PSA, H/O seasonal allergies, History of chickenpox, Hypertension, Insomnia, unspecified type, Major depressive disorder, PAD (peripheral artery disease) ([place]), Prostate cancer ([place]) (2013), and Torticollis.  PSH:    Past Surgical History:  Procedure Laterality Date   [place]  2013    Current Outpatient Medications  Medication Sig Dispense Refill   acetaminophen (TYLENOL) 325 MG tablet Take 2 tablets (650 mg total) by mouth every 6 (six)  hours as needed.     aspirin 81 MG tablet Take 81 mg by mouth daily.     calcium carbonate (OSCAL) 1500 (600 Ca) MG TABS tablet Take by mouth.     cetirizine (ZYRTEC) 10 MG tablet Take 10 mg by mouth daily.     clonazePAM (KLONOPIN) 0.5 MG tablet Take 0.5 mg by mouth 3 (three) times daily.     DENTAGEL 1.1 % GEL dental gel Place onto teeth at bedtime.     Leuprolide Acetate, 6 Month, (LUPRON) 45 MG injection  Inject 45 mg into the muscle every 6 (six) months.     lisinopril (ZESTRIL) 10 MG tablet Take 1 tablet by mouth once daily 30 tablet 2   Magnesium 400 MG TABS Take 400 mg by mouth daily.     Multiple Vitamin (MULTIVITAMIN) capsule Take 1 capsule by mouth daily.     Travoprost, BAK Free, (TRAVATAN) 0.004 % SOLN ophthalmic solution INSTILL 1 DROP INTO EACH EYE AT BEDTIME     traZODone (DESYREL) 50 MG tablet Take 50 mg by mouth at bedtime.     vitamin C (ASCORBIC ACID) 500 MG tablet Take 500 mg by mouth daily.      No current facility-administered medications for this visit.     Allergies:   Keflex [cephalexin]   Social History:  The patient  reports that he has never smoked. He has never been exposed to tobacco smoke. He has never used smokeless tobacco. He reports current alcohol use of about 1.0 standard drink of alcohol per week. He reports that he does not use drugs.   Family History:   family history includes Diabetes Mellitus II in his brother; Hypertension in his brother and mother; Prostate cancer in his brother.    Review of Systems: Review of Systems  Constitutional: Negative.   HENT: Negative.    Respiratory: Negative.    Cardiovascular: Negative.   Gastrointestinal: Negative.   Musculoskeletal:  Positive for neck pain.       Unsteady gait  Neurological: Negative.   Psychiatric/Behavioral: Negative.    All other systems reviewed and are negative.   PHYSICAL EXAM: VS:  BP (!) 180/58 (BP Location: Left Arm, Patient Position: Sitting, Cuff Size: Normal)   Pulse 91   Ht '5\' 9"'$  (1.753 m)   Wt 170 lb 4 oz (77.2 kg)   SpO2 97%   BMI 25.14 kg/m  , BMI Body mass index is 25.14 kg/m. Constitutional:  oriented to person, place, and time. No distress.  HENT:  Head: Grossly normal Eyes:  no discharge. No scleral icterus.  Neck: No JVD, no carotid bruits  Cardiovascular: Regular rate and rhythm, no murmurs appreciated Pulmonary/Chest: Clear to auscultation bilaterally, no  wheezes or rails Abdominal: Soft.  no distension.  no tenderness.  Musculoskeletal: Normal range of motion Neurological:  normal muscle tone. Coordination normal. No atrophy Skin: Skin warm and dry Psychiatric: normal affect, pleasant  Recent Labs: 08/19/2022: BUN 17; Creatinine, Ser 0.82; Potassium 4.3; Sodium 133    Lipid Panel No results found for: "CHOL", "HDL", "LDLCALC", "TRIG"    Wt Readings from Last 3 Encounters:  12/15/22 170 lb 4 oz (77.2 kg)  09/09/22 167 lb (75.8 kg)  08/19/22 163 lb 12.8 oz (74.3 kg)      ASSESSMENT AND PLAN:  Atherosclerosis of abdominal aorta (HCC) Previously seen on bone scan, Previously declined statin  Atypical chest pain, no further cardiac testing  Neck pain Re[facility] 3 [facility] Neck pain  managed with Tylenol, did not tolerate Celebrex Recommend he discuss with his team whether there are other options such as Flexeril and tramadol  PVC Asymptomatic Not on beta-blocker  Carotid stenosis Mild disease seen on ultrasound Not on statin  Gait instability Uses a cane, limited secondary to chronic neck pain  HTN: Blood pressure markedly elevated today, he feels in the second of neck pain Recommend he take amlodipine 5 mg daily as needed for systolic pressure over 768  Hyponatremia Low sodium is stable over the past 5 years    Total encounter time more than 30 minutes  Greater than 50% was spent in counseling and coordination of care with the patient    No orders of the defined types were placed in this encounter.    Signed, Esmond Plants, M.D., Ph.D. 12/15/2022  [place], [place]

## 2022-12-15 ENCOUNTER — Ambulatory Visit: Payer: Medicare Other | Attending: Cardiovascular Disease | Admitting: Cardiovascular Disease

## 2022-12-15 ENCOUNTER — Encounter: Payer: Self-pay | Admitting: Cardiovascular Disease

## 2022-12-15 VITALS — BP 180/58 | HR 91 | Ht 69.0 in | Wt 170.2 lb

## 2022-12-15 DIAGNOSIS — I700 Atherosclerosis of aorta: Secondary | ICD-10-CM | POA: Diagnosis not present

## 2022-12-15 DIAGNOSIS — I739 Peripheral vascular disease, unspecified: Secondary | ICD-10-CM | POA: Diagnosis present

## 2022-12-15 DIAGNOSIS — I6529 Occlusion and stenosis of unspecified carotid artery: Secondary | ICD-10-CM | POA: Diagnosis not present

## 2022-12-15 DIAGNOSIS — E782 Mixed hyperlipidemia: Secondary | ICD-10-CM

## 2022-12-15 DIAGNOSIS — I10 Essential (primary) hypertension: Secondary | ICD-10-CM | POA: Diagnosis not present

## 2022-12-15 MED ORDER — AMLODIPINE BESYLATE 5 MG PO TABS: 5.0000 mg | ORAL_TABLET | Freq: Every day | ORAL | 3 refills | Status: DC | PRN

## 2022-12-15 NOTE — Patient Instructions (Addendum)
Medication Instructions:  Please take amlodipine 5 mg daily as needed for pressure>160   Ask neurology about flexeril and tramadol for pain  If you need a refill on your cardiac medications before your next appointment, please call your pharmacy.   Lab work: No new labs needed  Testing/Procedures: No new testing needed  Follow-Up: At [facility], you and your health needs are our priority.  As part of our continuing mission to provide you with exceptional heart care, we have created designated Provider Care Teams.  These Care Teams include your primary Cardiologist (physician) and Advanced Practice Providers (APPs -  Physician Assistants and Nurse Practitioners) who all work together to provide you with the care you need, when you need it.  You will need a follow up appointment in 6 months  Providers on your designated Care Team:   Murray Hodgkins, NP Christell Faith, PA-C Cadence Kathlen Mody, [place]  COVID-19 Vaccine Information can be found at: ShippingScam.co.uk For questions related to vaccine distribution or appointments, please email [email or web address] or call 7081791529.

## 2022-12-18 ENCOUNTER — Other Ambulatory Visit: Payer: Self-pay | Admitting: Cardiovascular Disease

## 2023-01-26 ENCOUNTER — Ambulatory Visit: Payer: Medicare Other | Admitting: Cardiovascular Disease

## 2023-02-26 ENCOUNTER — Encounter: Payer: Self-pay | Admitting: Urology

## 2023-02-26 ENCOUNTER — Ambulatory Visit (INDEPENDENT_AMBULATORY_CARE_PROVIDER_SITE_OTHER): Payer: Medicare Other | Admitting: Urology

## 2023-02-26 VITALS — BP 148/83 | HR 96 | Ht 68.0 in | Wt 168.0 lb

## 2023-02-26 DIAGNOSIS — C61 Malignant neoplasm of prostate: Secondary | ICD-10-CM | POA: Diagnosis not present

## 2023-02-26 DIAGNOSIS — N3642 Intrinsic sphincter deficiency (ISD): Secondary | ICD-10-CM

## 2023-02-26 DIAGNOSIS — R32 Unspecified urinary incontinence: Secondary | ICD-10-CM | POA: Diagnosis not present

## 2023-02-26 NOTE — Progress Notes (Signed)
I, Stuart Smith,acting as a scribe for Stuart Altes, MD.,have documented all relevant documentation on the behalf of Stuart Altes, MD,as directed by  Stuart Altes, MD while in the presence of Stuart Altes, MD.   02/26/23 1:43 PM   Stuart Smith [birth year] [id number]  Referring provider: Jerl Mina, MD [address] [facility] [place],  [place] [address]  Chief Complaint  Patient presents with   Urinary Incontinence    HPI: Stuart Smith is a 89 y.o. male who is referred to transfer urologic care.   History of BPH status-post TURP with diagnosis T1a prostate cancer. Was started on intermittent Androgen therapy.  Last seen by Dr. Gala Lewandowsky November 2021. His PSA at that time was 12.94 and received a Leuprolide injection.  Last PSA 01/21/23 7.28 Long history of elevated PSA with multiple negative biopsies. Cancer subsequently diagnosed around 2013 after TURP was performed when PSA was 15. Urinary incontinence since his TURP and he wears pads   PMH: Past Medical History:  Diagnosis Date   Anemia    Cataracts, bilateral    Cervicalgia    Depression    Elevated PSA    H/O seasonal allergies    History of chickenpox    Hypertension    Insomnia, unspecified type    Major depressive disorder    PAD (peripheral artery disease)    Prostate cancer 2013   Surgery    Torticollis     Surgical History: Past Surgical History:  Procedure Laterality Date   BACK SURGERY  1995   GALLBLADDER SURGERY     PROSTATE SURGERY  2013    Home Medications:  Allergies as of 02/26/2023       Reactions   Keflex [cephalexin] Rash        Medication List        Accurate as of February 26, 2023  1:43 PM. If you have any questions, ask your nurse or doctor.          acetaminophen 325 MG tablet Commonly known as: Tylenol Take 2 tablets (650 mg total) by mouth every 6 (six) hours as needed.   amLODipine 5 MG tablet Commonly known as: NORVASC Take 1 tablet (5  mg total) by mouth daily as needed (pressure >160 systolic).   ascorbic acid 500 MG tablet Commonly known as: VITAMIN C Take 500 mg by mouth daily.   aspirin 81 MG tablet Take 81 mg by mouth daily.   calcium carbonate 1500 (600 Ca) MG Tabs tablet Commonly known as: OSCAL Take by mouth.   cetirizine 10 MG tablet Commonly known as: ZYRTEC Take 10 mg by mouth daily.   clonazePAM 0.5 MG tablet Commonly known as: KLONOPIN Take 0.5 mg by mouth 3 (three) times daily.   DentaGel 1.1 % Gel dental gel Generic drug: sodium fluoride Place onto teeth at bedtime.   Leuprolide Acetate (6 Month) 45 MG injection Commonly known as: LUPRON Inject 45 mg into the muscle every 6 (six) months.   lisinopril 10 MG tablet Commonly known as: ZESTRIL Take 1 tablet by mouth once daily   Magnesium 400 MG Tabs Take 400 mg by mouth daily.   multivitamin capsule Take 1 capsule by mouth daily.   Travoprost (BAK Free) 0.004 % Soln ophthalmic solution Commonly known as: TRAVATAN INSTILL 1 DROP INTO EACH EYE AT BEDTIME   traZODone 50 MG tablet Commonly known as: DESYREL Take 50 mg by mouth at bedtime.  Allergies:  Allergies  Allergen Reactions   Keflex [Cephalexin] Rash    Family History: Family History  Problem Relation Age of Onset   Hypertension Mother    Prostate cancer Brother    Diabetes Mellitus II Brother    Hypertension Brother     Social History:  reports that he has never smoked. He has never been exposed to tobacco smoke. He has never used smokeless tobacco. He reports current alcohol use of about 1.0 standard drink of alcohol per week. He reports that he does not use drugs.   Physical Exam: BP (!) 148/83   Pulse 96   Ht 5\' 8"  (1.727 m)   Wt 168 lb (76.2 kg)   BMI 25.54 kg/m   Constitutional:  Alert and oriented, No acute distress. HEENT: [place] AT Respiratory: Normal respiratory effort, no increased work of breathing. Psychiatric: Normal mood and  affect.   Assessment & Plan:    Prostate cancer On intermittent androgen ablation PSA last month was in the 7 range. Would hold off on ADT until PSA gets above 10 Schedule follow-up lab visit in 3 months with PSA  2.  Urinary incontinence Post TURP; managed with pads  I have reviewed the above documentation for accuracy and completeness, and I agree with the above.   Stuart AltesScott C Tanai Bouler, MD  [facility] [address], [address] [place], [place] [id number] [phone]

## 2023-03-14 ENCOUNTER — Other Ambulatory Visit: Payer: Self-pay | Admitting: Cardiovascular Disease

## 2023-04-20 ENCOUNTER — Other Ambulatory Visit: Payer: Self-pay | Admitting: Cardiovascular Disease

## 2023-05-21 ENCOUNTER — Other Ambulatory Visit: Payer: Self-pay | Admitting: Cardiovascular Disease

## 2023-05-28 ENCOUNTER — Ambulatory Visit: Payer: Medicare Other | Admitting: Urology

## 2023-05-28 ENCOUNTER — Other Ambulatory Visit: Payer: Medicare Other

## 2023-05-28 DIAGNOSIS — C61 Malignant neoplasm of prostate: Secondary | ICD-10-CM

## 2023-05-29 ENCOUNTER — Other Ambulatory Visit: Payer: Self-pay | Admitting: *Deleted

## 2023-05-29 DIAGNOSIS — C61 Malignant neoplasm of prostate: Secondary | ICD-10-CM

## 2023-05-29 LAB — PSA: Prostate Specific Ag, Serum: 7.2 ng/mL — ABNORMAL HIGH (ref 0.0–4.0)

## 2023-06-11 ENCOUNTER — Other Ambulatory Visit: Payer: Self-pay | Admitting: Family Medicine

## 2023-06-11 DIAGNOSIS — R1310 Dysphagia, unspecified: Secondary | ICD-10-CM

## 2023-06-16 ENCOUNTER — Ambulatory Visit
Admission: RE | Admit: 2023-06-16 | Discharge: 2023-06-16 | Disposition: A | Discharge status: Home / Self Care | Payer: Medicare Other | Source: Ambulatory Visit | Attending: Family Medicine | Admitting: Family Medicine

## 2023-06-16 ENCOUNTER — Other Ambulatory Visit: Payer: Self-pay | Admitting: Family Medicine

## 2023-06-16 DIAGNOSIS — R1310 Dysphagia, unspecified: Secondary | ICD-10-CM | POA: Insufficient documentation

## 2023-06-26 ENCOUNTER — Other Ambulatory Visit: Payer: Self-pay | Admitting: Family Medicine

## 2023-06-26 DIAGNOSIS — R053 Chronic cough: Secondary | ICD-10-CM

## 2023-06-26 DIAGNOSIS — R1310 Dysphagia, unspecified: Secondary | ICD-10-CM

## 2023-06-29 ENCOUNTER — Other Ambulatory Visit: Payer: Self-pay | Admitting: Family Medicine

## 2023-06-29 DIAGNOSIS — R633 Feeding difficulties, unspecified: Secondary | ICD-10-CM

## 2023-06-29 DIAGNOSIS — R1310 Dysphagia, unspecified: Secondary | ICD-10-CM

## 2023-07-07 ENCOUNTER — Ambulatory Visit: Payer: Medicare Other

## 2023-08-25 ENCOUNTER — Other Ambulatory Visit: Payer: Medicare Other

## 2023-08-25 DIAGNOSIS — C61 Malignant neoplasm of prostate: Secondary | ICD-10-CM

## 2023-08-26 LAB — PSA: Prostate Specific Ag, Serum: 4.8 ng/mL — ABNORMAL HIGH (ref 0.0–4.0)

## 2023-08-28 ENCOUNTER — Ambulatory Visit: Payer: Medicare Other | Admitting: Physician Assistant

## 2023-08-31 ENCOUNTER — Ambulatory Visit (INDEPENDENT_AMBULATORY_CARE_PROVIDER_SITE_OTHER): Payer: Medicare Other | Admitting: Physician Assistant

## 2023-08-31 VITALS — BP 177/80 | HR 92

## 2023-08-31 DIAGNOSIS — R350 Frequency of micturition: Secondary | ICD-10-CM | POA: Diagnosis not present

## 2023-08-31 DIAGNOSIS — C61 Malignant neoplasm of prostate: Secondary | ICD-10-CM

## 2023-08-31 LAB — BLADDER SCAN AMB NON-IMAGING: Scan Result: 333 mL

## 2023-08-31 NOTE — Progress Notes (Signed)
08/31/2023 1:51 PM   Stuart Smith 01-14-1927 [id number]  CC: Chief Complaint  Patient presents with   Follow-up   Prostate Cancer   HPI: Stuart Smith is a 89 y.o. male with PMH BPH s/p TURP in 2013 with postop urinary incontinence and prostate cancer on intermittent ADT who presents today for routine follow-up.   Today he reports bothersome urinary urgency, frequency, and leakage, which are chronic.  Most recent PSA on 08/26/2023 was 5.34.  PVR .  Last creatinine dated 08/26/2023 was 0.7.  PMH: Past Medical History:  Diagnosis Date   Anemia    Cataracts, bilateral    Cervicalgia    Depression    Elevated PSA    H/O seasonal allergies    History of chickenpox    Hypertension    Insomnia, unspecified type    Major depressive disorder    PAD (peripheral artery disease) (HCC)    Prostate cancer (HCC) 2013   Surgery    Torticollis     Surgical History: Past Surgical History:  Procedure Laterality Date   BACK SURGERY  1995   GALLBLADDER SURGERY     PROSTATE SURGERY  2013    Home Medications:  Allergies as of 08/31/2023       Reactions   Keflex [cephalexin] Rash        Medication List        Accurate as of August 31, 2023  1:51 PM. If you have any questions, ask your nurse or doctor.          STOP taking these medications    DentaGel 1.1 % Gel dental gel Generic drug: sodium fluoride       TAKE these medications    acetaminophen 325 MG tablet Commonly known as: Tylenol Take 2 tablets (650 mg total) by mouth every 6 (six) hours as needed.   amLODipine 5 MG tablet Commonly known as: NORVASC Take 1 tablet (5 mg total) by mouth daily as needed (pressure >160 systolic).   ascorbic acid 500 MG tablet Commonly known as: VITAMIN C Take 500 mg by mouth daily.   aspirin 81 MG tablet Take 81 mg by mouth daily.   calcium carbonate 1500 (600 Ca) MG Tabs tablet Commonly known as: OSCAL Take by mouth.   cetirizine 10 MG  tablet Commonly known as: ZYRTEC Take 10 mg by mouth daily.   clonazePAM 0.5 MG tablet Commonly known as: KLONOPIN Take 0.5 mg by mouth 3 (three) times daily.   Leuprolide Acetate (6 Month) 45 MG injection Commonly known as: LUPRON Inject 45 mg into the muscle every 6 (six) months.   lisinopril 10 MG tablet Commonly known as: ZESTRIL Take 1 tablet (10 mg total) by mouth daily. PLEASE CALL OFFICE TO SCHEDULE APPOINTMENT PRIOR TO NEXT REFILL   Magnesium 400 MG Tabs Take 400 mg by mouth daily.   multivitamin capsule Take 1 capsule by mouth daily.   Travoprost (BAK Free) 0.004 % Soln ophthalmic solution Commonly known as: TRAVATAN INSTILL 1 DROP INTO EACH EYE AT BEDTIME   traZODone 50 MG tablet Commonly known as: DESYREL Take 50 mg by mouth at bedtime.        Allergies:  Allergies  Allergen Reactions   Keflex [Cephalexin] Rash    Family History: Family History  Problem Relation Age of Onset   Hypertension Mother    Prostate cancer Brother    Diabetes Mellitus II Brother    Hypertension Brother     Social History:  reports that he has never smoked. He has never been exposed to tobacco smoke. He has never used smokeless tobacco. He reports current alcohol use of about 1.0 standard drink of alcohol per week. He reports that he does not use drugs.  Physical Exam: BP (!) 177/80   Pulse 92   Constitutional:  Alert and oriented, no acute distress, nontoxic appearing HEENT: [place], AT Cardiovascular: No clubbing, cyanosis, or edema Respiratory: Normal respiratory effort, no increased work of breathing Skin: No rashes, bruises or suspicious lesions Neurologic: Grossly intact, no focal deficits, moving all 4 extremities Psychiatric: Normal mood and affect  Laboratory Data: Results for orders placed or performed in visit on 08/31/23  Bladder Scan (Post Void Residual) in office  Result Value Ref Range   Scan Result 333 ml   Assessment & Plan:   1. Prostate cancer  [facility]) PSA has come down, no indication for ablative ADT yet.  Will continue with serial PSAs.  He is in agreement with this plan.  2. Urinary frequency Incomplete bladder emptying, asymptomatic with no UTIs and stable renal function.  I do not recommend OAB meds, as these will likely worsen his emptying.  We discussed treatment options including surveillance, alpha blockers (would feel comfortable only with silodosin given age and concern for orthostasis), or cystoscopy/TRUS and consideration of possible UDS versus outlet procedures.  He would like to proceed with surveillance, which is reasonable.  We discussed return precautions including abdominal pain/fullness and painful urination.  He expressed understanding. - Bladder Scan (Post Void Residual) in office   Return in about 3 months (around 12/01/2023) for Lab visit for PSA.  Carman Ching, PA-C  [facility] [place] [address], [address] [place], [place] [address] [phone]

## 2023-09-04 ENCOUNTER — Ambulatory Visit: Payer: Medicare Other | Admitting: Cardiovascular Disease

## 2023-09-04 NOTE — Progress Notes (Deleted)
Cardiology Office Note  Date:  09/04/2023   ID:  Stuart Smith, DOB 02-02-1927, MRN [id number]  PCP:  Stuart Mina, MD   No chief complaint on file.   HPI:  Stuart Smith is a 89 -year-old gentleman with a  long history of prostate issues, s/p TURP,  found to have prostate cancer, status post prostate resection,  [facility]  Mild carotid and aortic atherosclerosis who presents for routine followup of his abdominal aortic atherosclerosis, HTN  Last seen in clinic by myself 1/24  Reported having orthostasis, low blood pressure,  blood pressures at that time improved off amlodipine  In 5 oh up today main complaint is Severe neck pain Followed by [facility], has Botox injections every 3 months Not scheduled to follow-up till end of February but reports recently symptoms of neck pain have been severe Reports all he has is Tylenol for symptoms  Tried to do some cleaning in the shower today, neck pain  is severe on his visit today Blood pressure running high, unable to open his mouth secondary to spasm of the muscles in his jaw extending up the front of his face BP at home: 130-140/60-70  Not on celebrex for pain, prior concern this was making his lisinopril less effective  Reports having occasional light twinges in chest Better with burping, does not think it is cardiac in etiology  EKG personally reviewed by myself on todays visit Normal sinus rhythm rate 91 bpm right bundle branch block, left anterior fascicular block  Sedentary, uses a cane Has leg weakness, no longer goes to play golf or use the gym Labs reviewed Total chol, 162 LDL 75  History of vestibular problem 08/22/2018 hospital Carotid u/s showing B/l Mild soft and hard plaque in the carotid bulb and proximal ICA Did vestibular PT  Severe reaction left arm, after PNA short  Had near syncope Has picture of left arm, swollen, erythematous shoulder to wrist Started to imprve in one week, Month to  get better,    unable to complete the hormone therapy, last round of treatment was January 2015. He was unable to receive the radioactive seeds as he had a previous TURP  Echo 08/2018 - Left ventricle: The cavity size was normal. There was moderate   concentric hypertrophy. Systolic function was normal. The   estimated ejection fraction was in the range of 55% to 60%. Wall   motion was normal; there were no regional wall motion   abnormalities. Doppler parameters are consistent with abnormal   left ventricular relaxation (grade 1 diastolic dysfunction). - Aortic valve: There was trivial regurgitation. - Mitral valve: Calcified annulus. There was mild regurgitation.    PMH:   has a past medical history of Anemia, Cataracts, bilateral, Cervicalgia, Depression, Elevated PSA, H/O seasonal allergies, History of chickenpox, Hypertension, Insomnia, unspecified type, Major depressive disorder, PAD (peripheral artery disease) (HCC), Prostate cancer (HCC) (2013), and Torticollis.  PSH:    Past Surgical History:  Procedure Laterality Date   BACK SURGERY  1995   GALLBLADDER SURGERY     PROSTATE SURGERY  2013    Current Outpatient Medications  Medication Sig Dispense Refill   acetaminophen (TYLENOL) 325 MG tablet Take 2 tablets (650 mg total) by mouth every 6 (six) hours as needed.     amLODipine (NORVASC) 5 MG tablet Take 1 tablet (5 mg total) by mouth daily as needed (pressure >160 systolic). 30 tablet 3   aspirin 81 MG tablet Take 81 mg by mouth daily.  calcium carbonate (OSCAL) 1500 (600 Ca) MG TABS tablet Take by mouth.     cetirizine (ZYRTEC) 10 MG tablet Take 10 mg by mouth daily.     clonazePAM (KLONOPIN) 0.5 MG tablet Take 0.5 mg by mouth 3 (three) times daily.     Leuprolide Acetate, 6 Month, (LUPRON) 45 MG injection Inject 45 mg into the muscle every 6 (six) months.     lisinopril (ZESTRIL) 10 MG tablet Take 1 tablet (10 mg total) by mouth daily. PLEASE CALL OFFICE TO SCHEDULE  APPOINTMENT PRIOR TO NEXT REFILL 90 tablet 0   Magnesium 400 MG TABS Take 400 mg by mouth daily.     Multiple Vitamin (MULTIVITAMIN) capsule Take 1 capsule by mouth daily.     Travoprost, BAK Free, (TRAVATAN) 0.004 % SOLN ophthalmic solution INSTILL 1 DROP INTO EACH EYE AT BEDTIME     traZODone (DESYREL) 50 MG tablet Take 50 mg by mouth at bedtime.     vitamin C (ASCORBIC ACID) 500 MG tablet Take 500 mg by mouth daily.      No current facility-administered medications for this visit.     Allergies:   Keflex [cephalexin]   Social History:  The patient  reports that he has never smoked. He has never been exposed to tobacco smoke. He has never used smokeless tobacco. He reports current alcohol use of about 1.0 standard drink of alcohol per week. He reports that he does not use drugs.   Family History:   family history includes Diabetes Mellitus II in his brother; Hypertension in his brother and mother; Prostate cancer in his brother.    Review of Systems: Review of Systems  Constitutional: Negative.   HENT: Negative.    Respiratory: Negative.    Cardiovascular: Negative.   Gastrointestinal: Negative.   Musculoskeletal:  Positive for neck pain.       Unsteady gait  Neurological: Negative.   Psychiatric/Behavioral: Negative.    All other systems reviewed and are negative.   PHYSICAL EXAM: VS:  There were no vitals taken for this visit. , BMI There is no height or weight on file to calculate BMI. Constitutional:  oriented to person, place, and time. No distress.  HENT:  Head: Grossly normal Eyes:  no discharge. No scleral icterus.  Neck: No JVD, no carotid bruits  Cardiovascular: Regular rate and rhythm, no murmurs appreciated Pulmonary/Chest: Clear to auscultation bilaterally, no wheezes or rails Abdominal: Soft.  no distension.  no tenderness.  Musculoskeletal: Normal range of motion Neurological:  normal muscle tone. Coordination normal. No atrophy Skin: Skin warm and  dry Psychiatric: normal affect, pleasant  Recent Labs: No results found for requested labs within last 365 days.    Lipid Panel No results found for: "CHOL", "HDL", "LDLCALC", "TRIG"    Wt Readings from Last 3 Encounters:  02/26/23 168 lb (76.2 kg)  12/15/22 170 lb 4 oz (77.2 kg)  09/09/22 167 lb (75.8 kg)      ASSESSMENT AND PLAN:  Atherosclerosis of abdominal aorta (HCC) Previously seen on bone scan, Previously declined statin  Atypical chest pain, no further cardiac testing  Neck pain Receiving Botox every 3 months at [facility] Neck pain managed with Tylenol, did not tolerate Celebrex Recommend he discuss with his team whether there are other options such as Flexeril and tramadol  PVC Asymptomatic Not on beta-blocker  Carotid stenosis Mild disease seen on ultrasound Not on statin  Gait instability Uses a cane, limited secondary to chronic neck pain  HTN: Blood pressure  markedly elevated today, he feels in the second of neck pain Recommend he take amlodipine 5 mg daily as needed for systolic pressure over 160  Hyponatremia Low sodium is stable over the past 5 years    Total encounter time more than 30 minutes  Greater than 50% was spent in counseling and coordination of care with the patient    No orders of the defined types were placed in this encounter.    Signed, Dossie Arbour, M.D., Ph.D. 09/04/2023  [facility] [place], [place] [phone]

## 2023-09-07 NOTE — Progress Notes (Unsigned)
Cardiology Office Note  Date:  09/08/2023   ID:  Stuart Smith, DOB 14-Mar-1927, MRN [id number]  PCP:  Stuart Mina, MD   Chief Complaint  Patient presents with   6 month follow up    Patient c/o bilateral LE edema & occasional shortness of breath. Medications reviewed by the patient verbally.     HPI:  Stuart Smith is a 89 -year-old gentleman with a  long history of prostate issues, s/p TURP,  found to have prostate cancer, status post prostate resection,  [facility] [place]  Mild carotid and aortic atherosclerosis Chronic neck pain who presents for routine followup of his abdominal aortic atherosclerosis, HTN  Last seen in clinic by myself 1/24 Received Botox 3 weeks ago, was doing well until today Bad neck pain today  BP elevated 150 systolic BP last night systolic in 130 range, rate 60s  Torticollis neck pain sx have progressed over 30 years into arms, jaw, tongue Unable to lift head upwards, jaw somewhat fixated, difficulty eating unable to open his mouth secondary to spasm of the muscles in his jaw extending up the front of his face Reports able to eat some soup last night, Very cautious so he does not choke  Remains on lisinopril for blood pressure, has not required amlodipine  No significant chest pain or shortness of breath on exertion Recent lab work reviewed  EKG personally reviewed by myself on todays visit EKG Interpretation Date/Time:  Tuesday September 08 2023 14:11:41 EDT Ventricular Rate:  75 PR Interval:    QRS Duration:  138 QT Interval:  430 QTC Calculation: 480 R Axis:   -56  Text Interpretation: Normal sinus rhythm with PVCs Right bundle branch block Left anterior fascicular block Bifascicular block When compared with ECG of 22-Aug-2018 10:49, T wave inversion no longer evident in Anterior leads Confirmed by [name] [phone]) on 09/08/2023 2:19:02 PM   History of vestibular problem 08/22/2018 hospital Carotid u/s showing B/l Mild  soft and hard plaque in the carotid bulb and proximal ICA Did vestibular PT  Severe reaction left arm, after PNA short  Had near syncope Has picture of left arm, swollen, erythematous shoulder to wrist Started to imprve in one week, Month to get better,    unable to complete the hormone therapy, last round of treatment was January 2015. He was unable to receive the radioactive seeds as he had a previous TURP  Echo 08/2018 - Left ventricle: The cavity size was normal. There was moderate   concentric hypertrophy. Systolic function was normal. The   estimated ejection fraction was in the range of 55% to 60%. Wall   motion was normal; there were no regional wall motion   abnormalities. Doppler parameters are consistent with abnormal   left ventricular relaxation (grade 1 diastolic dysfunction). - Aortic valve: There was trivial regurgitation. - Mitral valve: Calcified annulus. There was mild regurgitation.   PMH:   has a past medical history of Anemia, Cataracts, bilateral, Cervicalgia, Depression, Elevated PSA, H/O seasonal allergies, History of chickenpox, Hypertension, Insomnia, unspecified type, Major depressive disorder, PAD (peripheral artery disease) (HCC), Prostate cancer (HCC) (2013), and Torticollis.  PSH:    Past Surgical History:  Procedure Laterality Date   BACK SURGERY  1995   GALLBLADDER SURGERY     PROSTATE SURGERY  2013    Current Outpatient Medications  Medication Sig Dispense Refill   acetaminophen (TYLENOL) 325 MG tablet Take 2 tablets (650 mg total) by mouth every 6 (six) hours as  needed.     amLODipine (NORVASC) 5 MG tablet Take 1 tablet (5 mg total) by mouth daily as needed (pressure >160 systolic). 30 tablet 3   aspirin 81 MG tablet Take 81 mg by mouth daily.     baclofen (LIORESAL) 10 MG tablet Take 5 mg by mouth 3 (three) times daily.     cetirizine (ZYRTEC) 10 MG tablet Take 10 mg by mouth daily.     clonazePAM (KLONOPIN) 0.5 MG tablet Take 0.5 mg by  mouth 3 (three) times daily.     Leuprolide Acetate, 6 Month, (LUPRON) 45 MG injection Inject 45 mg into the muscle every 6 (six) months.     lisinopril (ZESTRIL) 10 MG tablet Take 1 tablet (10 mg total) by mouth daily. PLEASE CALL OFFICE TO SCHEDULE APPOINTMENT PRIOR TO NEXT REFILL 90 tablet 0   Magnesium 400 MG TABS Take 400 mg by mouth daily.     Multiple Vitamin (MULTIVITAMIN) capsule Take 1 capsule by mouth daily.     Travoprost, BAK Free, (TRAVATAN) 0.004 % SOLN ophthalmic solution INSTILL 1 DROP INTO EACH EYE AT BEDTIME     traZODone (DESYREL) 50 MG tablet Take 50 mg by mouth at bedtime.     vitamin C (ASCORBIC ACID) 500 MG tablet Take 500 mg by mouth daily.      No current facility-administered medications for this visit.    Allergies:   Keflex [cephalexin]   Social History:  The patient  reports that he has never smoked. He has never been exposed to tobacco smoke. He has never used smokeless tobacco. He reports current alcohol use of about 1.0 standard drink of alcohol per week. He reports that he does not use drugs.   Family History:   family history includes Diabetes Mellitus II in his brother; Hypertension in his brother and mother; Prostate cancer in his brother.    Review of Systems: Review of Systems  Constitutional: Negative.   HENT: Negative.    Respiratory: Negative.    Cardiovascular: Negative.   Gastrointestinal: Negative.   Musculoskeletal:  Positive for neck pain.       Unsteady gait  Neurological: Negative.   Psychiatric/Behavioral: Negative.    All other systems reviewed and are negative.   PHYSICAL EXAM: VS:  BP (!) 150/76 (BP Location: Left Arm, Patient Position: Sitting, Cuff Size: Normal)   Pulse 75   Ht 5\' 11"  (1.803 m)   Wt 166 lb 6.4 oz (75.5 kg)   SpO2 97%   BMI 23.21 kg/m  , BMI Body mass index is 23.21 kg/m. Constitutional:  oriented to person, place, and time. No distress.  HENT:  Head: Grossly normal Eyes:  no discharge. No scleral  icterus.  Neck: No JVD, no carotid bruits  Cardiovascular: Regular rate and rhythm, no murmurs appreciated Pulmonary/Chest: Clear to auscultation bilaterally, no wheezes or rails Abdominal: Soft.  no distension.  no tenderness.  Musculoskeletal: Normal range of motion Neurological:  normal muscle tone. Coordination normal. No atrophy Skin: Skin warm and dry Psychiatric: normal affect, pleasant  Recent Labs: No results found for requested labs within last 365 days.    Lipid Panel No results found for: "CHOL", "HDL", "LDLCALC", "TRIG"    Wt Readings from Last 3 Encounters:  09/08/23 166 lb 6.4 oz (75.5 kg)  02/26/23 168 lb (76.2 kg)  12/15/22 170 lb 4 oz (77.2 kg)      ASSESSMENT AND PLAN:  Atherosclerosis of abdominal aorta (HCC) Previously seen on bone scan, Previously declined statin  No further workup needed  Neck pain Receiving Botox every 3 months at [facility] Received injection 3 weeks ago, woke up with back pain today Neck pain managed with Tylenol, and baclofen, did not tolerate Celebrex,  Other pain control options per his specialist providing Botox  PVC Asymptomatic, noted on EKG Not on beta-blocker  Carotid stenosis Mild disease seen on ultrasound Not on statin  Gait instability Uses a cane, limited secondary to chronic neck pain  HTN: Elevate blood pressure in the office, well-controlled at home when his neck is relaxed He has amlodipine to take as needed for sustained high-pressure  Hyponatremia Low sodium is stable over the past 5 years Stable 137     Orders Placed This Encounter  Procedures   EKG 12-Lead     Signed, Dossie Arbour, M.D., Ph.D. 09/08/2023  [facility] [place], [place] [phone]

## 2023-09-08 ENCOUNTER — Ambulatory Visit: Payer: Medicare Other | Attending: Cardiovascular Disease | Admitting: Cardiovascular Disease

## 2023-09-08 ENCOUNTER — Encounter: Payer: Self-pay | Admitting: Cardiovascular Disease

## 2023-09-08 VITALS — BP 150/76 | HR 75 | Ht 71.0 in | Wt 166.4 lb

## 2023-09-08 DIAGNOSIS — I700 Atherosclerosis of aorta: Secondary | ICD-10-CM | POA: Insufficient documentation

## 2023-09-08 DIAGNOSIS — I739 Peripheral vascular disease, unspecified: Secondary | ICD-10-CM | POA: Insufficient documentation

## 2023-09-08 DIAGNOSIS — I10 Essential (primary) hypertension: Secondary | ICD-10-CM | POA: Insufficient documentation

## 2023-09-08 MED ORDER — LISINOPRIL 10 MG PO TABS: 10.0000 mg | ORAL_TABLET | Freq: Every day | ORAL | 3 refills | Status: DC

## 2023-09-08 NOTE — Patient Instructions (Signed)

## 2023-11-04 ENCOUNTER — Encounter: Payer: Self-pay | Admitting: Dermatology

## 2023-11-04 ENCOUNTER — Ambulatory Visit: Payer: Medicare Other | Admitting: Dermatology

## 2023-11-04 DIAGNOSIS — L578 Other skin changes due to chronic exposure to nonionizing radiation: Secondary | ICD-10-CM

## 2023-11-04 DIAGNOSIS — D692 Other nonthrombocytopenic purpura: Secondary | ICD-10-CM

## 2023-11-04 DIAGNOSIS — Z872 Personal history of diseases of the skin and subcutaneous tissue: Secondary | ICD-10-CM

## 2023-11-04 DIAGNOSIS — Z1283 Encounter for screening for malignant neoplasm of skin: Secondary | ICD-10-CM

## 2023-11-04 DIAGNOSIS — W098XXA Fall on or from other playground equipment, initial encounter: Secondary | ICD-10-CM

## 2023-11-04 DIAGNOSIS — D1801 Hemangioma of skin and subcutaneous tissue: Secondary | ICD-10-CM

## 2023-11-04 DIAGNOSIS — L820 Inflamed seborrheic keratosis: Secondary | ICD-10-CM | POA: Diagnosis not present

## 2023-11-04 DIAGNOSIS — D229 Melanocytic nevi, unspecified: Secondary | ICD-10-CM

## 2023-11-04 DIAGNOSIS — L814 Other melanin hyperpigmentation: Secondary | ICD-10-CM

## 2023-11-04 DIAGNOSIS — L821 Other seborrheic keratosis: Secondary | ICD-10-CM

## 2023-11-04 NOTE — Patient Instructions (Addendum)

## 2023-11-04 NOTE — Progress Notes (Signed)
Follow-Up Visit   Subjective  Stuart Smith is a 89 y.o. male who presents for the following: Skin Cancer Screening and Full Body Skin Exam, hx of AKs  The patient presents for Total-Body Skin Exam (TBSE) for skin cancer screening and mole check. The patient has spots, moles and lesions to be evaluated, some may be new or changing and the patient may have concern these could be cancer.  The following portions of the chart were reviewed this encounter and updated as appropriate: medications, allergies, medical history  Review of Systems:  No other skin or systemic complaints except as noted in HPI or Assessment and Plan.  Objective  Well appearing patient in no apparent distress; mood and affect are within normal limits.  A full examination was performed including scalp, head, eyes, ears, nose, lips, neck, chest, axillae, abdomen, back, buttocks, bilateral upper extremities, bilateral lower extremities, hands, feet, fingers, toes, fingernails, and toenails. All findings within normal limits unless otherwise noted below.   Relevant physical exam findings are noted in the Assessment and Plan.  L temple hairline x 1 Stuck on waxy paps with erythema  Assessment & Plan   SKIN CANCER SCREENING PERFORMED TODAY.  ACTINIC DAMAGE - Chronic condition, secondary to cumulative UV/sun exposure - diffuse scaly erythematous macules with underlying dyspigmentation - Recommend daily broad spectrum sunscreen SPF 30+ to sun-exposed areas, reapply every 2 hours as needed.  - Staying in the shade or wearing long sleeves, sun glasses (UVA+UVB protection) and wide brim hats (4-inch brim around the entire circumference of the hat) are also recommended for sun protection.  - Call for new or changing lesions.  LENTIGINES, SEBORRHEIC KERATOSES, HEMANGIOMAS - Benign normal skin lesions - Benign-appearing - Call for any changes  MELANOCYTIC NEVI - Tan-brown and/or pink-flesh-colored symmetric macules and  papules - Benign appearing on exam today - Observation - Call clinic for new or changing moles - Recommend daily use of broad spectrum spf 30+ sunscreen to sun-exposed areas.   Purpura - Chronic; persistent and recurrent.  Treatable, but not curable. - Violaceous macules and patches - Benign - Related to trauma, age, sun damage and/or use of blood thinners, chronic use of topical and/or oral steroids - Observe - Can use OTC arnica containing moisturizer such as Dermend Bruise Formula if desired - Call for worsening or other concerns  HISTORY OF PRECANCEROUS ACTINIC KERATOSIS - site(s) of PreCancerous Actinic Keratosis clear today. - these may recur and new lesions may form requiring treatment to prevent transformation into skin cancer - observe for new or changing spots and contact [facility] for appointment if occur - photoprotection with sun protective clothing; sunglasses and broad spectrum sunscreen with SPF of at least 30 + and frequent self skin exams recommended - yearly exams by a dermatologist recommended for persons with history of PreCancerous Actinic Keratoses   INFLAMED SEBORRHEIC KERATOSIS L temple hairline x 1 Symptomatic, irritating, patient would like treated. Destruction of lesion - L temple hairline x 1 Complexity: simple   Destruction method: cryotherapy   Informed consent: discussed and consent obtained   Timeout:  patient name, date of birth, surgical site, and procedure verified Lesion destroyed using liquid nitrogen: Yes   Region frozen until ice ball extended beyond lesion: Yes   Outcome: patient tolerated procedure well with no complications   Post-procedure details: wound care instructions given   Return in about 1 year (around 11/03/2024) for TBSE, Hx of AKs.  I, Sonya Hupman, RMA, am acting as  scribe for Armida Sans, MD .   Documentation: I have reviewed the above documentation for accuracy and completeness, and I agree with the  above.  Armida Sans, MD

## 2023-11-14 ENCOUNTER — Encounter: Payer: Self-pay | Admitting: Dermatology

## 2023-11-30 ENCOUNTER — Other Ambulatory Visit: Payer: Self-pay

## 2023-11-30 DIAGNOSIS — C61 Malignant neoplasm of prostate: Secondary | ICD-10-CM

## 2023-12-01 ENCOUNTER — Other Ambulatory Visit: Payer: Medicare Other

## 2023-12-01 DIAGNOSIS — C61 Malignant neoplasm of prostate: Secondary | ICD-10-CM

## 2023-12-02 LAB — PSA: Prostate Specific Ag, Serum: 5.2 ng/mL — ABNORMAL HIGH (ref 0.0–4.0)

## 2023-12-07 ENCOUNTER — Other Ambulatory Visit: Payer: Self-pay

## 2023-12-07 DIAGNOSIS — C61 Malignant neoplasm of prostate: Secondary | ICD-10-CM

## 2024-02-16 DEATH — deceased

## 2024-03-08 ENCOUNTER — Other Ambulatory Visit: Payer: Self-pay

## 2024-03-14 ENCOUNTER — Ambulatory Visit: Payer: Self-pay | Admitting: Urology

## 2024-11-02 ENCOUNTER — Ambulatory Visit: Admitting: Dermatology

## 2024-11-03 ENCOUNTER — Ambulatory Visit: Payer: Medicare Other | Admitting: Dermatology
